# Patient Record
Sex: Female | Born: 1980 | Race: Black or African American | Hispanic: No | Marital: Single | Smoking: Never smoker
Health system: Southern US, Community
[De-identification: ages and names within clinical notes are randomized; demographics above are authoritative.]

## PROBLEM LIST (undated history)

## (undated) DIAGNOSIS — O1490 Unspecified pre-eclampsia, unspecified trimester: Secondary | ICD-10-CM

## (undated) DIAGNOSIS — Z973 Presence of spectacles and contact lenses: Secondary | ICD-10-CM

## (undated) DIAGNOSIS — I10 Essential (primary) hypertension: Secondary | ICD-10-CM

## (undated) DIAGNOSIS — D649 Anemia, unspecified: Secondary | ICD-10-CM

## (undated) DIAGNOSIS — K589 Irritable bowel syndrome without diarrhea: Secondary | ICD-10-CM

## (undated) DIAGNOSIS — A6000 Herpesviral infection of urogenital system, unspecified: Secondary | ICD-10-CM

## (undated) DIAGNOSIS — N920 Excessive and frequent menstruation with regular cycle: Secondary | ICD-10-CM

## (undated) DIAGNOSIS — IMO0002 Reserved for concepts with insufficient information to code with codable children: Secondary | ICD-10-CM

## (undated) DIAGNOSIS — D219 Benign neoplasm of connective and other soft tissue, unspecified: Secondary | ICD-10-CM

## (undated) HISTORY — DX: Reserved for concepts with insufficient information to code with codable children: IMO0002

## (undated) HISTORY — DX: Essential (primary) hypertension: I10

## (undated) HISTORY — PX: OTHER SURGICAL HISTORY: SHX169

## (undated) HISTORY — DX: Unspecified pre-eclampsia, unspecified trimester: O14.90

## (undated) HISTORY — PX: DILATION AND CURETTAGE OF UTERUS: SHX78

## (undated) HISTORY — DX: Benign neoplasm of connective and other soft tissue, unspecified: D21.9

## (undated) HISTORY — DX: Herpesviral infection of urogenital system, unspecified: A60.00

## (undated) HISTORY — PX: MYOMECTOMY ABDOMINAL APPROACH: SUR870

## (undated) HISTORY — DX: Irritable bowel syndrome, unspecified: K58.9

---

## 2001-12-12 ENCOUNTER — Other Ambulatory Visit: Admission: RE | Admit: 2001-12-12 | Discharge: 2001-12-12 | Payer: Self-pay | Admitting: Obstetrics and Gynecology

## 2002-04-24 DIAGNOSIS — R87619 Unspecified abnormal cytological findings in specimens from cervix uteri: Secondary | ICD-10-CM

## 2002-04-24 DIAGNOSIS — IMO0002 Reserved for concepts with insufficient information to code with codable children: Secondary | ICD-10-CM

## 2002-04-24 HISTORY — DX: Unspecified abnormal cytological findings in specimens from cervix uteri: R87.619

## 2002-04-24 HISTORY — PX: COLPOSCOPY: SHX161

## 2002-04-24 HISTORY — DX: Reserved for concepts with insufficient information to code with codable children: IMO0002

## 2002-12-26 ENCOUNTER — Other Ambulatory Visit: Admission: RE | Admit: 2002-12-26 | Discharge: 2002-12-26 | Payer: Self-pay | Admitting: Obstetrics and Gynecology

## 2004-01-26 ENCOUNTER — Other Ambulatory Visit: Admission: RE | Admit: 2004-01-26 | Discharge: 2004-01-26 | Payer: Self-pay | Admitting: Obstetrics and Gynecology

## 2004-07-05 ENCOUNTER — Emergency Department (HOSPITAL_COMMUNITY): Admission: EM | Admit: 2004-07-05 | Discharge: 2004-07-05 | Payer: Self-pay | Admitting: Emergency Medicine

## 2005-01-30 ENCOUNTER — Other Ambulatory Visit: Admission: RE | Admit: 2005-01-30 | Discharge: 2005-01-30 | Payer: Self-pay | Admitting: Obstetrics and Gynecology

## 2006-04-24 DIAGNOSIS — A6000 Herpesviral infection of urogenital system, unspecified: Secondary | ICD-10-CM

## 2006-04-24 HISTORY — DX: Herpesviral infection of urogenital system, unspecified: A60.00

## 2006-09-20 ENCOUNTER — Encounter (INDEPENDENT_AMBULATORY_CARE_PROVIDER_SITE_OTHER): Payer: Self-pay | Admitting: Obstetrics and Gynecology

## 2006-09-20 ENCOUNTER — Ambulatory Visit (HOSPITAL_COMMUNITY): Admission: RE | Admit: 2006-09-20 | Discharge: 2006-09-20 | Payer: Self-pay | Admitting: Obstetrics and Gynecology

## 2007-03-25 LAB — CONVERTED CEMR LAB

## 2007-07-30 ENCOUNTER — Ambulatory Visit: Payer: Self-pay | Admitting: Family Medicine

## 2007-07-30 DIAGNOSIS — I10 Essential (primary) hypertension: Secondary | ICD-10-CM | POA: Insufficient documentation

## 2007-08-19 ENCOUNTER — Telehealth: Payer: Self-pay | Admitting: Family Medicine

## 2008-03-02 ENCOUNTER — Encounter: Admission: RE | Admit: 2008-03-02 | Discharge: 2008-03-02 | Payer: Self-pay | Admitting: Gastroenterology

## 2008-05-07 ENCOUNTER — Encounter: Admission: RE | Admit: 2008-05-07 | Discharge: 2008-05-07 | Payer: Self-pay | Admitting: Obstetrics and Gynecology

## 2008-09-20 ENCOUNTER — Emergency Department (HOSPITAL_BASED_OUTPATIENT_CLINIC_OR_DEPARTMENT_OTHER): Admission: EM | Admit: 2008-09-20 | Discharge: 2008-09-20 | Payer: Self-pay | Admitting: Emergency Medicine

## 2008-10-17 ENCOUNTER — Encounter (INDEPENDENT_AMBULATORY_CARE_PROVIDER_SITE_OTHER): Payer: Self-pay | Admitting: Obstetrics and Gynecology

## 2008-10-17 ENCOUNTER — Ambulatory Visit (HOSPITAL_COMMUNITY): Admission: AD | Admit: 2008-10-17 | Discharge: 2008-10-18 | Payer: Self-pay | Admitting: Obstetrics and Gynecology

## 2009-04-30 ENCOUNTER — Ambulatory Visit: Payer: Self-pay | Admitting: Family Medicine

## 2009-05-13 ENCOUNTER — Ambulatory Visit: Payer: Self-pay | Admitting: Family Medicine

## 2009-05-17 ENCOUNTER — Ambulatory Visit: Payer: Self-pay | Admitting: Family Medicine

## 2009-05-19 ENCOUNTER — Encounter: Payer: Self-pay | Admitting: Family Medicine

## 2009-05-20 ENCOUNTER — Telehealth: Payer: Self-pay | Admitting: Family Medicine

## 2009-05-20 LAB — CONVERTED CEMR LAB
AST: 13 units/L (ref 0–37)
BUN: 8 mg/dL (ref 6–23)
Cholesterol: 123 mg/dL (ref 0–200)
Creatinine, Ser: 0.7 mg/dL (ref 0.40–1.20)
HDL: 40 mg/dL (ref >39)
Potassium: 4.1 meq/L (ref 3.5–5.3)
Sodium: 138 meq/L (ref 135–145)
TSH: 0.962 microintl units/mL (ref 0.350–4.500)
Total Bilirubin: 0.3 mg/dL (ref 0.3–1.2)
Total CHOL/HDL Ratio: 3.1
Total Protein: 7.9 g/dL (ref 6.0–8.3)
Triglycerides: 61 mg/dL (ref <150)
VLDL: 12 mg/dL (ref 0–40)

## 2009-07-30 ENCOUNTER — Ambulatory Visit: Payer: Self-pay | Admitting: Family Medicine

## 2009-08-25 ENCOUNTER — Ambulatory Visit: Payer: Self-pay | Admitting: Family Medicine

## 2009-08-25 LAB — CONVERTED CEMR LAB
Glucose, Urine, Semiquant: NEGATIVE
pH: 7

## 2009-08-26 ENCOUNTER — Encounter: Payer: Self-pay | Admitting: Family Medicine

## 2010-02-25 DIAGNOSIS — Z98891 History of uterine scar from previous surgery: Secondary | ICD-10-CM | POA: Insufficient documentation

## 2010-05-03 ENCOUNTER — Encounter: Payer: Self-pay | Admitting: Family Medicine

## 2010-05-24 NOTE — Assessment & Plan Note (Signed)
Summary: Pregnancy   Vital Signs:  Patient profile:   30 year old female Height:      67 inches Weight:      181 pounds Pulse rate:   106 / minute BP sitting:   127 / 76  (left arm) Cuff size:   regular  Vitals Entered By: Kathlene November (July 30, 2009 3:32 PM) CC: pregnancy test   Primary Care Provider:  Linford Arnold, C  CC:  pregnancy test.  History of Present Illness: Frist day of last period was 06/17/2009.  Home pregnancy  is +. Made apt on the April 25th.  Due date 03-25-2010.  Taking a prenatal vitamin.  Today is 6 weeks and 2 days.    Current Medications (verified): 1)  Prenatal/folic Acid  Tabs (Prenatal Vit-Fe Fumarate-Fa) .Marland Kitchen.. 1 Tab By Mouth Once Daily  Allergies (verified): 1)  ! * Guafenasin  Comments:  Nurse/Medical Assistant: The patient's medications and allergies were reviewed with the patient and were updated in the Medication and Allergy Lists. Kathlene November (July 30, 2009 3:33 PM)  Social History: Reviewed history from 05/13/2009 and no changes required. 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   Never Smoked Alcohol use-yes, rarely   Drug use-no Regular exercise-yes, twice a week.   No caffeine daily.    Impression & Recommendations:  Problem # 1:  PREGNANCY (ICD-V22.2) Dsicussed will get a serum to confirm for her OB office. She already has an appt and is taking a PNV. Discussed care and exercise. Call her ob if any spotting. She is 6 weeks and 2 days today.   Orders: Urine Pregnancy Test  (04540) T-Pregnancy (Serum), Quant. [phone])  Complete Medication List: 1)  Prenatal/folic Acid Tabs (Prenatal vit-fe fumarate-fa) .Marland Kitchen.. 1 tab by mouth once daily  Laboratory Results   Urine Tests  Date/Time Received: 07/30/2009 Date/Time Reported: 07/30/2009    Urine HCG: positive

## 2010-05-24 NOTE — Assessment & Plan Note (Signed)
Summary: possible uti   Vital Signs:  Patient Profile:   30 Years Old Female CC:      Polyuria, tingling not painful urination x 3 days, pt is [redacted]wks pregnant Height:     67 inches Weight:      182 pounds O2 Sat:      100 % O2 treatment:    Room Air Temp:     98.2 degrees F oral Pulse rate:   109 / minute Pulse rhythm:   regular Resp:     12 per minute BP sitting:   135 / 82  (right arm) Cuff size:   regular  Vitals Entered By: Emilio Math (Aug 25, 2009 8:14 AM)                  Prior Medication List:  PRENATAL/FOLIC ACID  TABS (PRENATAL VIT-FE FUMARATE-FA) 1 tab by mouth once daily   Current Allergies (reviewed today): ! * GUAFENASINHistory of Present Illness Chief Complaint: Polyuria, tingling not painful urination x 3 days, pt is [redacted]wks pregnant History of Present Illness:  Patient has been having frequency and atingling sensation for the last 3 days. Not so much pain as tingling. patient is also about [redacted] weeks pregnant.  Current Problems: URINARY TRACT INFECTION (ICD-599.0) PREGNANCY (ICD-V22.2) HEALTH MAINTENANCE EXAM (ICD-V70.0) ELEVATED BP READING WITHOUT DX HYPERTENSION (ICD-796.2)   Current Meds PRENATAL/FOLIC ACID  TABS (PRENATAL VIT-FE FUMARATE-FA) 1 tab by mouth once daily AMOXICILLIN 875 MG TABS (AMOXICILLIN)   REVIEW OF SYSTEMS Constitutional Symptoms      Denies fever, chills, night sweats, weight loss, weight gain, and fatigue.  Eyes       Denies change in vision, eye pain, eye discharge, glasses, contact lenses, and eye surgery. Ear/Nose/Throat/Mouth       Denies hearing loss/aids, change in hearing, ear pain, ear discharge, dizziness, frequent runny nose, frequent nose bleeds, sinus problems, sore throat, hoarseness, and tooth pain or bleeding.  Respiratory       Denies dry cough, productive cough, wheezing, shortness of breath, asthma, bronchitis, and emphysema/COPD.  Cardiovascular       Denies murmurs, chest pain, and tires easily with  exhertion.    Gastrointestinal       Denies stomach pain, nausea/vomiting, diarrhea, constipation, blood in bowel movements, and indigestion. Genitourniary       Complains of painful urination.      Denies kidney stones and loss of urinary control. Neurological       Denies paralysis, seizures, and fainting/blackouts. Musculoskeletal       Denies muscle pain, joint pain, joint stiffness, decreased range of motion, redness, swelling, muscle weakness, and gout.  Skin       Denies bruising, unusual mles/lumps or sores, and hair/skin or nail changes.  Psych       Denies mood changes, temper/anger issues, anxiety/stress, speech problems, depression, and sleep problems.  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   Never Smoked Alcohol use-yes, rarely   Drug use-no Regular exercise-yes, twice a week.   No caffeine daily.   Risk Factors: Alcohol Use: 0 (07/30/2007) Caffeine Use: 2 (07/30/2007) Exercise: no (07/30/2007)  Risk Factors: Smoking Status: never (07/30/2007)  Past Medical History: OB: Dr. Recardo Evangelist in [facility]  Past Surgical History: Reviewed history from 05/13/2009 and no changes required. Left  Breast Biopsy- normal on 06/1995 D & C 08/2006, 09/2008 Abdominal myomectomy in 01/2009  Family History: Reviewed history from 07/30/2007 and no changes required. Parent with alcoholism Aunt with BrCa Parent, aunt with HTN  Social History: Reviewed history from 05/13/2009 and no changes required. 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   Never Smoked Alcohol use-yes, rarely   Drug use-no Regular exercise-yes, twice a week.   No caffeine daily.  Physical Exam General appearance:  well developed, well nourished, no acute distress Head: normocephalic, atraumatic Abdomen: soft, non-tender without obvious organomegaly Back: no CVA tenderness Skin: no obvious rashes or lesions MSE: oriented to time, place, and person Assessment Problems:   PREGNANCY (ICD-V22.2) HEALTH MAINTENANCE EXAM (ICD-V70.0) ELEVATED BP READING WITHOUT DX HYPERTENSION (ICD-796.2) New Problems: URINARY TRACT INFECTION (ICD-599.0)  UTI  Patient Education: Patient and/or caregiver instructed in the following: rest fluids and Tylenol.  Plan New Medications/Changes: AMOXICILLIN 875 MG TABS (AMOXICILLIN)  #20 x 0, 08/25/2009, Hassan Rowan MD  New Orders: UA Dipstick w/o Micro (manual) [81002] T-Culture, Urine [id number] Est. Patient Level III [81191] Planning Comments:   as below  Follow Up: Follow up on an as needed basis, Follow up with Primary Physician  The patient and/or caregiver has been counseled thoroughly with regard to medications prescribed including dosage, schedule, interactions, rationale for use, and possible side effects and they verbalize understanding.  Diagnoses and expected course of recovery discussed and will return if not improved as expected or if the condition worsens. Patient and/or caregiver verbalized understanding.  Prescriptions: AMOXICILLIN 875 MG TABS (AMOXICILLIN)   #20 x 0   Entered and Authorized by:   Hassan Rowan MD   Signed by:   Hassan Rowan MD on 08/25/2009   Method used:   Printed then faxed to ...       [facility]* (retail)       [address] [place], [place]  [address]       Ph: [phone]       Fax: [phone]   RxID:   [id number]   Patient Instructions: 1)  Please schedule an appointment with your primary doctor in : 2)  Please schedule a follow-up appointment in 2 weeks. 3)  Take your antibiotic as prescribed until ALL of it is gone, but stop if you develop a rash or swelling and contact our office as soon as  possible. 4)  Please schedule a follow-up appointment as needed.  Laboratory Results   Urine Tests  Date/Time Received: Aug 25, 2009 8:56 AM  Date/Time Reported: Aug 25, 2009 8:56 AM   Routine Urinalysis   Color: yellow Appearance: Clear Glucose: negative   (Normal Range: Negative) Bilirubin: negative   (Normal Range: Negative) Ketone: negative   (Normal Range: Negative) Spec. Gravity: 1.020   (Normal Range: 1.003-1.035) Blood: trace-intact   (Normal Range: Negative) pH: 7.0   (Normal Range: 5.0-8.0) Protein: 30   (Normal Range: Negative) Urobilinogen: 0.2   (Normal Range: 0-1) Nitrite: negative   (Normal Range: Negative) Leukocyte Esterace: small   (Normal Range: Negative)      Emilio Math  Aug 25, 2009 8:59 AM

## 2010-05-24 NOTE — Progress Notes (Signed)
Summary: Not feeling any better  Phone Note Call from Patient Call back at Home Phone [phone]   Caller: Patient Call For: Nani Gasser MD Summary of Call: Pt calls and states that she is not feeling any better and ear starting to hurt as well. Told to call if no better. Uses [facility] in [place] Initial call taken by: Kathlene November,  May 20, 2009 8:30 AM  Follow-up for Phone Call        abx sent.  call back if not better after complete ABX.  Follow-up by: Nani Gasser MD,  May 20, 2009 8:34 AM    New/Updated Medications: AMOXICILLIN 500 MG CAPS (AMOXICILLIN) Take 1 tablet by mouth three times a day for 10 days Prescriptions: AMOXICILLIN 500 MG CAPS (AMOXICILLIN) Take 1 tablet by mouth three times a day for 10 days  #30 x 0   Entered and Authorized by:   Nani Gasser MD   Signed by:   Nani Gasser MD on 05/20/2009   Method used:   Electronically to        [facility]* (retail)       [address] [place], [place]  [address]       Ph: [phone]       Fax: [phone]   RxID:   [id number]

## 2010-05-24 NOTE — Assessment & Plan Note (Signed)
Summary: CPE   Vital Signs:  Patient profile:   30 year old female Height:      67 inches Weight:      175 pounds Pulse rate:   99 / minute BP sitting:   137 / 81  (left arm) Cuff size:   regular  Vitals Entered By: Kathlene November (May 13, 2009 4:19 PM) CC: CPE no pap   Primary Care Provider:  Linford Arnold, C  CC:  CPE no pap.  History of Present Illness: Sees gyn - Has her fu pap in one month. Last one was a year ago.  She is try ing to get prepnant. Had myomectomy in the fall to help her get pregnant. Shis is taking a PNV and off her OCPs.  No other specific complaints today.    Current Medications (verified): 1)  Prenatal/folic Acid  Tabs (Prenatal Vit-Fe Fumarate-Fa) .Marland Kitchen.. 1 Tab By Mouth Once Daily  Allergies (verified): 1)  ! * Guafenasin  Comments:  Nurse/Medical Assistant: The patient's medications and allergies were reviewed with the patient and were updated in the Medication and Allergy Lists. Kathlene November (May 13, 2009 4:19 PM)  Past History:  Past Medical History: OB: Dr. Recardo Evangelist in [facility]  Past Surgical History: Left Breast Biopsy- normal on 06/1995 D & C 08/2006, 09/2008 Abdominal myomectomy in 01/2009  Family History: Reviewed history from 07/30/2007 and no changes required. Parent with alcoholism Aunt with BrCa Parent, aunt with HTN  Social History: Warehouse manager.  Post baccalaureate degree.  Married to [employer].  No children.   Never Smoked Alcohol use-yes, rarely   Drug use-no Regular exercise-yes, twice a week.   No caffeine daily.   Review of Systems  The patient denies anorexia, fever, weight loss, weight gain, vision loss, decreased hearing, hoarseness, chest pain, syncope, dyspnea on exertion, peripheral edema, prolonged cough, headaches, hemoptysis, abdominal pain, melena, hematochezia, severe indigestion/heartburn, hematuria, incontinence, genital sores, muscle weakness, suspicious skin lesions, transient  blindness, difficulty walking, depression, unusual weight change, abnormal bleeding, enlarged lymph nodes, angioedema, and breast masses.    Physical Exam  General:  Well-developed,well-nourished,in no acute distress; alert,appropriate and cooperative throughout examination Head:  Normocephalic and atraumatic without obvious abnormalities. No apparent alopecia or balding. Eyes:  No corneal or conjunctival inflammation noted. EOMI. Perrla.  Ears:  External ear exam shows no significant lesions or deformities.  Otoscopic examination reveals clear canals, tympanic membranes are intact bilaterally without bulging, retraction, inflammation or discharge. Hearing is grossly normal bilaterally. Nose:  External nasal examination shows no deformity or inflammation. Mouth:  Oral mucosa and oropharynx without lesions or exudates.  Teeth in good repair. Neck:  No deformities, masses, or tenderness noted. No TM.  Chest Wall:  No deformities, masses, or tenderness noted. Lungs:  Normal respiratory effort, chest expands symmetrically. Lungs are clear to auscultation, no crackles or wheezes. Heart:  Normal rate and regular rhythm. S1 and S2 normal without gallop, murmur, click, rub or other extra sounds. Abdomen:  Bowel sounds positive,abdomen soft and non-tender without masses, organomegaly or hernias noted. Msk:  No deformity or scoliosis noted of thoracic or lumbar spine.   Pulses:  R and L carotid,radial,dorsalis pedis and posterior tibial pulses are full and equal bilaterally Extremities:  No clubbing, cyanosis, edema, or deformity noted with normal full range of motion of all joints.   Neurologic:  No cranial nerve deficits noted. Station and gait are normal. DTRs are symmetrical throughout. Sensory, motor and coordinative functions appear intact. Skin:  no  rashes.   Cervical Nodes:  No lymphadenopathy noted Psych:  Cognition and judgment appear intact. Alert and cooperative with normal attention span and  concentration. No apparent delusions, illusions, hallucinations   Impression & Recommendations:  Problem # 1:  HEALTH MAINTENANCE EXAM (ICD-V70.0) Exam looks great today. BP is much better but still borderline. Will keep an eye on this.  See pt instructions.  Due for screening labs.  Reviewed important info since she is trying to get pregnant.  Orders: T-Comprehensive Metabolic Panel [id number]) T-Lipid Profile [id number]) T-TSH [id number])  Complete Medication List: 1)  Prenatal/folic Acid Tabs (Prenatal vit-fe fumarate-fa) .Marland Kitchen.. 1 tab by mouth once daily  Patient Instructions: 1)  Keep taking prenatal vitamin. 2)  Make sure BP under 140/90 when go for OB appt next month. If high please come back in. 3)  Try to increase exercise to 3 days a  week. 4)  Eat low fat diet and minimize alcohol and caffeine while trying to get pregnant. 5)  Ask your OB about immune status to rubella.    Flu Vaccine Next Due:  Refused Last PAP:  Unknown (03/25/2007 10:25:36 AM) PAP Result Date:  04/24/2008 PAP Result:  normal PAP Next Due:  2 yr

## 2010-05-24 NOTE — Assessment & Plan Note (Signed)
Summary: headache, cough-drk yellow, sorethroat x 3 dys rm 3   Vital Signs:  Patient Profile:   30 Years Old Female CC:      Cold & URI symptoms Height:     67 inches Weight:      176 pounds O2 Sat:      100 % O2 treatment:    Room Air Temp:     99.7 degrees F oral Pulse rate:   115 / minute Pulse rhythm:   irregular Resp:     16 per minute BP sitting:   152 / 96  (right arm) Cuff size:   regular  Vitals Entered By: Areta Haber CMA (April 30, 2009 4:57 PM)                  Current Allergies: ! * GUAFENASIN      History of Present Illness Chief Complaint: Cold & URI symptoms History of Present Illness: Subjective: Patient complains of URI symptoms for 2 days. + mild sore throat + cough today No pleuritic pain No wheezing + nasal congestion No post-nasal drainage No sinus pain/pressure No itchy/red eyes No earache No hemoptysis No SOB No fever/chills No nausea No vomiting No abdominal pain No diarrhea No skin rashes + mild fatigue No myalgias No headache Used OTC meds without relief   Current Problems: URI (ICD-465.9) ELEVATED BP READING WITHOUT DX HYPERTENSION (ICD-796.2) SINUSITIS- ACUTE-NOS (ICD-461.9)   Current Meds PRENATAL/FOLIC ACID  TABS (PRENATAL VIT-FE FUMARATE-FA) 1 tab by mouth once daily CORICIDIN HBP COLD/FLU 2-325 MG TABS (CHLORPHENIRAMINE-APAP) As directed BENZONATATE 200 MG CAPS (BENZONATATE) One by mouth HS as needed cough AZITHROMYCIN 250 MG TABS (AZITHROMYCIN) Two tabs by mouth on day 1, then 1 tab daily on days 2 through 5  REVIEW OF SYSTEMS Constitutional Symptoms      Denies fever, chills, night sweats, weight loss, weight gain, and fatigue.  Eyes       Denies change in vision, eye pain, eye discharge, glasses, contact lenses, and eye surgery. Ear/Nose/Throat/Mouth       Complains of frequent runny nose, sinus problems, sore throat, and hoarseness.      Denies hearing loss/aids, change in hearing, ear pain, ear  discharge, dizziness, frequent nose bleeds, and tooth pain or bleeding.      Comments: x 3 dys Respiratory       Complains of productive cough.      Denies dry cough, wheezing, shortness of breath, asthma, bronchitis, and emphysema/COPD.  Cardiovascular       Denies murmurs, chest pain, and tires easily with exhertion.    Gastrointestinal       Denies stomach pain, nausea/vomiting, diarrhea, constipation, blood in bowel movements, and indigestion. Genitourniary       Denies painful urination, kidney stones, and loss of urinary control. Neurological       Complains of headaches.      Denies paralysis, seizures, and fainting/blackouts. Musculoskeletal       Denies muscle pain, joint pain, joint stiffness, decreased range of motion, redness, swelling, muscle weakness, and gout.  Skin       Denies bruising, unusual mles/lumps or sores, and hair/skin or nail changes.  Psych       Denies mood changes, temper/anger issues, anxiety/stress, speech problems, depression, and sleep problems. 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  Married to [employer].  No children.   Never Smoked Alcohol use-yes Drug use-no Regular exercise-no  Risk Factors: Alcohol Use: 0 (07/30/2007) Caffeine Use: 2 (07/30/2007) Exercise: no (07/30/2007)  Risk Factors: Smoking Status: never (07/30/2007)   Objective:  Appearance:  Patient appears healthy, stated age, and in no acute distress  Eyes:  Pupils are equal, round, and reactive to light and accomdation.  Extraocular movement is intact.  Conjunctivae are not inflamed.  Ears:  Canals normal.  Tympanic membranes normal.   Nose:  Normal  septum.  Normal turbinates, mildly congested.    No sinus tenderness present.  Pharynx:  Normal  Neck:  Supple.  No adenopathy is present.   Lungs:  Clear to auscultation.  Breath sounds are equal.  Heart:  Regular rate and rhythm without murmurs, rubs, or gallops.  Abdomen:  Nontender without masses or hepatosplenomegaly.  Bowel sounds are present.  No CVA or flank tenderness.  Skin:  No rash Assessment New Problems: URI (ICD-465.9)  VIRAL URI  Plan New Medications/Changes: AZITHROMYCIN 250 MG TABS (AZITHROMYCIN) Two tabs by mouth on day 1, then 1 tab daily on days 2 through 5  #6 tabs x 0, 04/30/2009, Donna Christen MD BENZONATATE 200 MG CAPS (BENZONATATE) One by mouth HS as needed cough  #12 x 0, 04/30/2009, Donna Christen MD  New Orders: New Patient Level III [phone] Planning Comments:   Treat symptomatically for now:  expectorant/decongestant daytime, cough suppressant at night, rest/fluids If not improved one week, or develops persistent fever, add Z-pack Follow-up with PCP if not improving.   The patient and/or caregiver has been counseled thoroughly with regard to medications prescribed including dosage, schedule, interactions, rationale for use, and possible side effects and they verbalize understanding.  Diagnoses and expected course of recovery discussed and will return if not improved as expected or if the condition worsens. Patient and/or caregiver verbalized understanding.  Prescriptions: AZITHROMYCIN 250 MG TABS (AZITHROMYCIN) Two tabs by mouth on day 1, then 1 tab daily on days 2 through 5  #6 tabs x 0   Entered and Authorized by:   Donna Christen MD   Signed by:   Donna Christen MD on 04/30/2009   Method used:   Print then Give to Patient   RxID:   [id number] BENZONATATE 200 MG CAPS (BENZONATATE) One by mouth HS as needed cough  #12 x 0   Entered and Authorized by:   Donna Christen MD   Signed by:   Donna Christen MD on 04/30/2009   Method used:   Print then Give  to Patient   RxID:   [id number]   Patient Instructions: 1)  May use Mucinex D (guaifenesin with decongestant) twice daily for congestion. 2)  Increase fluid intake, rest. 3)  May use Afrin nasal spray (or generic oxymetazoline) twice daily for about 5 days.  Also recommend using saline nasal spray several times daily and/or saline nasal irrigation. 4)  Add azithromycin if not improving one week or persistent fever develops 5)  Followup with family doctor if not improving 10 to 14 days.

## 2010-05-24 NOTE — Assessment & Plan Note (Signed)
Summary: SWOLLEN GLAND   Vital Signs:  Patient profile:   30 year old female Height:      67 inches Weight:      179 pounds BMI:     28.14 Temp:     98.5 degrees F oral Pulse rate:   101 / minute BP sitting:   125 / 88  (left arm) Cuff size:   regular  Vitals Entered By: Kathlene November (May 17, 2009 3:45 PM) CC: lymph node swollen on left side of neck, yesterday teeth hurt and H/A   Primary Care Provider:  Linford Arnold, C  CC:  lymph node swollen on left side of neck and yesterday teeth hurt and H/A.  History of Present Illness: lymph node swollen on left side of neck, yesterday teeth hurt and H/A.  HA started about 3 days ago. pai in teeth is on the left side on teh upper and lower jaw.  Got out a large food  particle from her tonsils this AM and noticed it was really swollen. Looked up swollen LN on webmd and now scared. No fever or other URI sxs. No ear pain.   Current Medications (verified): 1)  Prenatal/folic Acid  Tabs (Prenatal Vit-Fe Fumarate-Fa) .Marland Kitchen.. 1 Tab By Mouth Once Daily  Allergies (verified): 1)  ! * Guafenasin  Comments:  Nurse/Medical Assistant: The patient's medications and allergies were reviewed with the patient and were updated in the Medication and Allergy Lists. Kathlene November (May 17, 2009 3:46 PM)  Physical Exam  General:  Well-developed,well-nourished,in no acute distress; alert,appropriate and cooperative throughout examination Head:  Normocephalic and atraumatic without obvious abnormalities. No apparent alopecia or balding. Eyes:  No corneal or conjunctival inflammation noted. EOMI. Perrla.  Ears:  External ear exam shows no significant lesions or deformities.  Otoscopic examination reveals clear canals, tympanic membranes are intact bilaterally without bulging, retraction, inflammation or discharge. Hearing is grossly normal bilaterally. Nose:  External nasal examination shows no deformity or inflammation.  Mouth:  Oral mucosa and oropharynx  without lesions or exudates.  Teeth in good repair. Neck:  No deformities, masses, or tenderness noted. Skin:  no rashes.   Cervical Nodes:  SMall anterior left cerv LN. Mildly tender.    Impression & Recommendations:  Problem # 1:  CERVICAL LYMPHADENOPATHY (ICD-785.6) Discussed that likely irritated by tonsillar stones. If signs with throat dn jaw not better in 2-3 days then please let me know and will treat with an ABX.  if LN not resolvedin in 3-4 weesk then please call and will re-evaluate.    Complete Medication List: 1)  Prenatal/folic Acid Tabs (Prenatal vit-fe fumarate-fa) .Marland Kitchen.. 1 tab by mouth once daily

## 2010-05-26 NOTE — Letter (Signed)
Summary: [facility]   Imported By: Lanelle Bal 05/19/2010 10:48:17  _____________________________________________________________________  External Attachment:    Type:   Image     Comment:   External Document

## 2010-06-20 ENCOUNTER — Encounter: Payer: Self-pay | Admitting: Family Medicine

## 2010-06-30 ENCOUNTER — Encounter: Payer: Self-pay | Admitting: Family Medicine

## 2010-06-30 ENCOUNTER — Other Ambulatory Visit (HOSPITAL_COMMUNITY)
Admission: RE | Admit: 2010-06-30 | Discharge: 2010-06-30 | Disposition: A | Discharge status: Home / Self Care | Payer: BC Managed Care – PPO | Source: Ambulatory Visit | Attending: Family Medicine | Admitting: Family Medicine

## 2010-06-30 ENCOUNTER — Other Ambulatory Visit: Payer: Self-pay | Admitting: Family Medicine

## 2010-06-30 ENCOUNTER — Encounter (INDEPENDENT_AMBULATORY_CARE_PROVIDER_SITE_OTHER): Payer: BC Managed Care – PPO | Admitting: Family Medicine

## 2010-06-30 DIAGNOSIS — Z01419 Encounter for gynecological examination (general) (routine) without abnormal findings: Secondary | ICD-10-CM

## 2010-07-05 ENCOUNTER — Telehealth: Payer: Self-pay | Admitting: Family Medicine

## 2010-07-05 NOTE — Assessment & Plan Note (Signed)
Summary: CPE   Vital Signs:  Patient profile:   30 year old female Height:      67 inches Weight:      200 pounds Pulse rate:   71 / minute BP sitting:   139 / 86  (right arm) Cuff size:   regular  Vitals Entered By: Avon Gully CMA, Duncan Dull) (June 30, 2010 3:47 PM) CC: CPE,no pap   Primary Care Provider:  Linford Arnold, C  CC:  CPE and no pap.  History of Present Illness: Has gained 18 lbs. Had a baby November 4th, 2011. No longer nursing.  interested in the Femcap. She really wants non-hormonal birthcontrol because of the fibroids.   Has been taking labetolol three times a day for her HTN. Now she is not pregnant or nursing so would like ot change meds.   Current Medications (verified): 1)  Prenatal/folic Acid  Tabs (Prenatal Vit-Fe Fumarate-Fa) .Marland Kitchen.. 1 Tab By Mouth Once Daily 2)  Labetalol Hcl 5 Mg/ml Soln (Labetalol Hcl)  Allergies (verified): 1)  ! * Guafenasin  Comments:  Nurse/Medical Assistant: The patient's medications and allergies were reviewed with the patient and were updated in the Medication and Allergy Lists. Avon Gully CMA, Duncan Dull) (June 30, 2010 3:49 PM)  Family History: Parent with alcoholism 2 Maternal Aunt with BrCa Pat Aunt with BrCA Parent, aunt with HTN  Social History: Warehouse manager.  Post baccalaureate degree.  Married to [employer].  1 girl.  Never Smoked Alcohol use-yes, rarely   Drug use-no Regular exercise-yes, twice a week.   No caffeine daily.   Physical Exam  General:  Well-developed,well-nourished,in no acute distress; alert,appropriate and cooperative throughout examination Head:  Normocephalic and atraumatic without obvious abnormalities. No apparent alopecia or balding. Eyes:  No corneal or conjunctival inflammation noted. EOMI. Perrla. Ears:  External ear exam shows no significant lesions or deformities.  Otoscopic examination reveals clear canals, tympanic membranes are intact bilaterally without  bulging, retraction, inflammation or discharge. Hearing is grossly normal bilaterally. Nose:  External nasal examination shows no deformity or inflammation.  Mouth:  Oral mucosa and oropharynx without lesions or exudates.  Teeth in good repair. Neck:  No deformities, masses, or tenderness noted. Lungs:  Normal respiratory effort, chest expands symmetrically. Lungs are clear to auscultation, no crackles or wheezes. Heart:  Normal rate and regular rhythm. S1 and S2 normal without gallop, murmur, click, rub or other extra sounds.   Impression & Recommendations:  Problem # 1:  ROUTINE GYNECOLOGICAL EXAMINATION (ICD-V72.31) Exam is normal today will call with the pap results. Continue the regular exercise!!! Keep up the good work.    Problem # 2:  HYPERTENSION, BENIGN (ICD-401.1) Will change to metoprolol and f/u in one month to recheck BP.  Her updated medication list for this problem includes:    Metoprolol Succinate 50 Mg Xr24h-tab (Metoprolol succinate) .Marland Kitchen... Take 1 tablet by mouth once a day  Complete Medication List: 1)  Prenatal/folic Acid Tabs (Prenatal vit-fe fumarate-fa) .Marland Kitchen.. 1 tab by mouth once daily 2)  Metoprolol Succinate 50 Mg Xr24h-tab (Metoprolol succinate) .... Take 1 tablet by mouth once a day  Patient Instructions: 1)  We will call with the pap results. 2)  Your choleserol looked great last year.  3)  Encourage you to get your flu shot next year since you have a baby at home now.   Prescriptions: METOPROLOL SUCCINATE 50 MG XR24H-TAB (METOPROLOL SUCCINATE) Take 1 tablet by mouth once a day  #30 x 1  Entered and Authorized by:   Nani Gasser MD   Signed by:   Nani Gasser MD on 06/30/2010   Method used:   Electronically to        [facility]* (retail)       [address] [place], [place]  [address]       Ph: [phone]       Fax: [phone]   RxID:   [id number]    Orders Added: 1)  Est. Patient age 75-39  [73]          Appended Document: CPE     Physical Exam  Chest Wall:  No deformities, masses, or tenderness noted. Breasts:  No mass, nodules, thickening, tenderness, bulging, retraction, inflamation, nipple discharge or skin changes noted.   Abdomen:  Bowel sounds positive,abdomen soft and non-tender without masses, organomegaly or hernias noted. Genitalia:  Normal introitus for age, no external lesions, no vaginal discharge, mucosa pink and moist, no vaginal or cervical lesions, no vaginal atrophy, no friaility or hemorrhage, normal uterus size and position, no adnexal masses or tenderness Msk:  No deformity or scoliosis noted of thoracic or lumbar spine.   Pulses:  R and L carotid,radial,dorsalis pedis and posterior tibial pulses are full and equal bilaterally Extremities:  No clubbing, cyanosis, edema, or deformity noted with normal full range of motion of all joints.   Neurologic:  No cranial nerve deficits noted. Station and gait are normal. Sensory, motor and coordinative functions appear intact. Skin:  no rashes.   Cervical Nodes:  No lymphadenopathy noted Psych:  Cognition and judgment appear intact. Alert and cooperative with normal attention span and concentration. No apparent delusions, illusions, hallucinations

## 2010-07-12 NOTE — Progress Notes (Signed)
Summary: Femcap ?  Phone Note Outgoing Call   Summary of Call: Called and let pt know they don't do femcap down the hall.  in GYN  Initial call taken by: Nani Gasser MD,  July 05, 2010 12:37 PM

## 2010-08-01 LAB — CBC
HCT: 18.4 % — ABNORMAL LOW (ref 36.0–46.0)
HCT: 22.3 % — ABNORMAL LOW (ref 36.0–46.0)
Hemoglobin: 6.4 g/dL — CL (ref 12.0–15.0)
Hemoglobin: 7.7 g/dL — CL (ref 12.0–15.0)
MCHC: 34.5 g/dL (ref 30.0–36.0)
Platelets: 351 10*3/uL (ref 150–400)
RBC: 2.48 MIL/uL — ABNORMAL LOW (ref 3.87–5.11)
RDW: 13.8 % (ref 11.5–15.5)
WBC: 7.9 10*3/uL (ref 4.0–10.5)

## 2010-08-01 LAB — HCG, QUANTITATIVE, PREGNANCY: hCG, Beta Chain, Quant, S: 27 m[IU]/mL — ABNORMAL HIGH (ref <5)

## 2010-08-02 LAB — URINE MICROSCOPIC-ADD ON

## 2010-08-02 LAB — URINALYSIS, ROUTINE W REFLEX MICROSCOPIC
Ketones, ur: NEGATIVE mg/dL
Nitrite: NEGATIVE
pH: 6 (ref 5.0–8.0)

## 2010-08-02 LAB — URINE CULTURE

## 2010-08-02 LAB — PREGNANCY, URINE: Preg Test, Ur: POSITIVE

## 2010-09-06 NOTE — Op Note (Signed)
[name], [name]              ACCOUNT NO.:  [id number]   MEDICAL RECORD NO.:  [id number]          PATIENT TYPE:  AMB   LOCATION:  SDC                           FACILITY:  [facility]   PHYSICIAN:  Carrington Clamp, M.D. DATE OF BIRTH:  1980/10/25   DATE OF PROCEDURE:  09/20/2006  DATE OF DISCHARGE:                               OPERATIVE REPORT   PREOPERATIVE DIAGNOSIS:  Missed abortion.   POSTOPERATIVE DIAGNOSIS:  Missed abortion.   OPERATION PERFORMED:  Dilation and evacuation.   SURGEON:  Carrington Clamp, M.D.   ASSISTANT:  None.   ANESTHESIA:  General LMA.   SPECIMENS:  Uterine contents.   ESTIMATED BLOOD LOSS:  150 mL.   IV FLUIDS:  700 mL.   URINE OUTPUT:  Not measured.   COMPLICATIONS:  None.   FINDINGS:  An 8-week size uterus down to 7-week size with good crie.   POSTOP MEDICATIONS:  Methergine.   COUNTS:  Correct x3.   DESCRIPTION OF PROCEDURE:  After adequate general anesthesia was  achieved, the patient was prepped and draped in the usual sterile  fashion dorsal lithotomy position.  The bladder was emptied with a red  rubber catheter and then speculum placed in the vagina.  The cervix was  secured with the single toothed tenaculum and the cervix dilated up with  Shawnie Pons dilators.  An 8 mm curette was passed into uterus and suction  curettage performed.  There was slightly excessive bleeding for the  procedure and Methergine was given with result of reducing the amount of  bleeding.  Sharp and suction curette was alternated until a good crie  was obtained.  Bleeding was then minimal and all instruments were  withdrawn from the vagina.  The patient tolerated the procedure well and  was returned to recovery room in stable condition.      Carrington Clamp, M.D.  Electronically Signed     MH/MEDQ  D:  09/20/2006  T:  09/20/2006  Job:  [id number]

## 2010-09-06 NOTE — Op Note (Signed)
NAMEANTHONY, [name]              ACCOUNT NO.:  [id number]   MEDICAL RECORD NO.:  [id number]          PATIENT TYPE:  INP   LOCATION:  9306                          FACILITY:  [facility]   PHYSICIAN:  Malva Limes, M.D.    DATE OF BIRTH:  04-12-1981   DATE OF PROCEDURE:  10/17/2008  DATE OF DISCHARGE:  10/18/2008                               OPERATIVE REPORT   PREOPERATIVE DIAGNOSES:  1. Menorrhagia.  2. Incomplete abortion.  3. Severe anemia.  4. Uterine fibroids.   POSTOPERATIVE DIAGNOSES:  1. Menorrhagia.  2. Incomplete abortion.  3. Severe anemia.  4. Uterine fibroids.   PROCEDURE:  Dilation and curettage.   SURGEON:  Malva Limes, MD   ANESTHESIA:  MAC with paracervical block.   DRAINS:  Red rubber catheter, bladder.   ESTIMATED BLOOD LOSS:  20 mL.   SPECIMENS:  Products of conception sent to Pathology.   COMPLICATIONS:  None.   PROCEDURE:  The patient was taken to the operating room.  She was placed  in dorsal supine position.  MAC anesthesia was then administered.  She  was then placed in dorsal lithotomy position.  She was prepped and  draped in usual fashion for this procedure.  Her bladder was drained  with a red rubber catheter.  Examination under anesthesia revealed a 12  to 13 weeks' size uterus with multiple uterine fibroids.  Uterus was  retroverted and a sterile speculum was placed in the vaginal cavity.  A  10 mL of 1% lidocaine was used for paracervical block.  The cervix was  grasped with a single-tooth tenaculum and serially dilated to a 69-  [place].  A suction cached cannula was placed into the uterine cavity and  tissue removed.  Sharp curettage was then performed by repeat suction.  Multiple uterine fibroids were palpated during the D and C.  Most  notably the large one of the posterior aspect of the fundus.  Following  this procedure, minimal bleeding was noted.  The patient tolerated the  procedure well.  She was taken to  recovery room in  stable condition.  The patient will be observed  overnight and she is stable, she will be discharged to home.  An attempt  will be made not to transfuse this patient, however, she was  symptomatic, she will have 2 units of packed red blood cells.           ______________________________  Malva Limes, M.D.     MA/MEDQ  D:  10/18/2008  T:  10/18/2008  Job:  [id number]

## 2011-01-08 ENCOUNTER — Encounter: Payer: Self-pay | Admitting: Family Medicine

## 2011-01-10 ENCOUNTER — Ambulatory Visit (INDEPENDENT_AMBULATORY_CARE_PROVIDER_SITE_OTHER): Payer: BC Managed Care – PPO | Admitting: Family Medicine

## 2011-01-10 ENCOUNTER — Encounter: Payer: Self-pay | Admitting: Family Medicine

## 2011-01-10 VITALS — BP 141/94 | HR 59 | Wt 200.0 lb

## 2011-01-10 DIAGNOSIS — I10 Essential (primary) hypertension: Secondary | ICD-10-CM

## 2011-01-10 NOTE — Progress Notes (Signed)
  Subjective:    Patient ID: [name], female    DOB: 07/18/1980, 30 y.o.   MRN: [id number]  Hypertension This is a new problem. The current episode started more than 1 year ago. The problem is unchanged. The problem is uncontrolled. Pertinent negatives include no chest pain or shortness of breath. There are no associated agents to hypertension. Risk factors for coronary artery disease include no known risk factors. Past treatments include beta blockers. The current treatment provides mild improvement. There are no compliance problems.    When BP checks BP at work usually looks good. Missed meds for the lat 3-4 days until this AM. She did take it today. She is otherwise tolerating the metoprolol well. She denies any side effects such as lightheadedness or dizziness.  She also needs a form completed for by the nurses.   Review of Systems  Respiratory: Negative for shortness of breath.   Cardiovascular: Negative for chest pain.       Objective:   Physical Exam  Constitutional: She is oriented to person, place, and time. She appears well-developed and well-nourished.  HENT:  Head: Normocephalic and atraumatic.  Cardiovascular: Normal rate, regular rhythm and normal heart sounds.   Pulmonary/Chest: Effort normal and breath sounds normal.  Musculoskeletal: She exhibits no edema.       Lumbar spine with normal flexion, extension, rotation right and left and side bending. Neck, upper extremities, low extremities with normal range of motion. Strength in the upper extremities and lower extremities 5 over 5 bilaterally. Patellar, Achilles, brachial, radial reflexes 1+ bilaterally  Neurological: She is alert and oriented to person, place, and time.  Skin: Skin is warm and dry.  Psychiatric: She has a normal mood and affect. Her behavior is normal. Judgment and thought content normal.          Assessment & Plan:  Hypertension-is elevated today. I asked that she recheck it at work and fax  over the blood pressure numbers to me. If she is still not well controlled and we can increase her Toprol 100 mg.  Form completed for [facility] nurses. Normal musculoskeletal exam. She says she will get her flu shot through work.

## 2011-01-31 ENCOUNTER — Encounter: Payer: Self-pay | Admitting: Obstetrics & Gynecology

## 2011-01-31 ENCOUNTER — Other Ambulatory Visit: Payer: Self-pay | Admitting: Obstetrics & Gynecology

## 2011-01-31 ENCOUNTER — Ambulatory Visit (INDEPENDENT_AMBULATORY_CARE_PROVIDER_SITE_OTHER): Payer: BC Managed Care – PPO | Admitting: Obstetrics & Gynecology

## 2011-01-31 VITALS — BP 129/85 | HR 93 | Temp 97.7°F | Resp 16 | Ht 68.0 in | Wt 201.0 lb

## 2011-01-31 DIAGNOSIS — D219 Benign neoplasm of connective and other soft tissue, unspecified: Secondary | ICD-10-CM

## 2011-01-31 DIAGNOSIS — D259 Leiomyoma of uterus, unspecified: Secondary | ICD-10-CM

## 2011-01-31 NOTE — Progress Notes (Signed)
  Subjective:    Patient ID: [name], female    DOB: 01-31-1981, 31 y.o.   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  She is here for preconceptual counseling.  She has had 2 miscarriages for which she blames her fibroids.  She had a myomectomy and HSG in [place] in 2010 and then conceived in 2011.  She wants to complete her childbearing ASAP because she is afraid that her fibroids will return.  She is also concerned because she was preeclamptic on bedrest for "weeks" in the hospital before she delivered last year.  She has been treated for HTN since then.    Review of Systems    She is a married speech therapist.  Her pap was normal in 3/12 Objective:   Physical Exam        Assessment & Plan:  Preconceptual counseling- I will get an u/s to determine how many fibroids she currently has. We have discussed [employer] and weight loss before pregnancy to help with her HTN and risk of pre eclampsia.

## 2011-02-02 ENCOUNTER — Ambulatory Visit
Admission: RE | Admit: 2011-02-02 | Discharge: 2011-02-02 | Disposition: A | Discharge status: Home / Self Care | Payer: BC Managed Care – PPO | Source: Ambulatory Visit | Attending: Obstetrics & Gynecology | Admitting: Obstetrics & Gynecology

## 2011-02-02 DIAGNOSIS — D219 Benign neoplasm of connective and other soft tissue, unspecified: Secondary | ICD-10-CM

## 2011-02-07 LAB — LIPID PANEL
Cholesterol: 138 mg/dL (ref 0–200)
HDL: 48 mg/dL (ref >39)
LDL Cholesterol: 78 mg/dL (ref 0–99)
Triglycerides: 60 mg/dL (ref <150)
VLDL: 12 mg/dL (ref 0–40)

## 2011-02-07 LAB — COMPLETE METABOLIC PANEL WITH GFR
Albumin: 4.2 g/dL (ref 3.5–5.2)
CO2: 25 mEq/L (ref 19–32)
Calcium: 9.1 mg/dL (ref 8.4–10.5)
Chloride: 105 mEq/L (ref 96–112)
Creat: 0.7 mg/dL (ref 0.50–1.10)
Potassium: 4.2 mEq/L (ref 3.5–5.3)
Total Bilirubin: 0.3 mg/dL (ref 0.3–1.2)

## 2011-02-08 ENCOUNTER — Other Ambulatory Visit: Payer: Self-pay | Admitting: Family Medicine

## 2011-02-08 ENCOUNTER — Telehealth: Payer: Self-pay | Admitting: Family Medicine

## 2011-02-08 MED ORDER — METOPROLOL SUCCINATE ER 50 MG PO TB24: 50.0000 mg | ORAL_TABLET | Freq: Every day | ORAL | Status: DC

## 2011-02-08 NOTE — Telephone Encounter (Signed)
Message copied by Gifford Shave on Wed Feb 08, 2011  1:18 PM ------      Message from: Milinda Cave, [place] [facility]      Created: Wed Feb 08, 2011  9:09 AM       Pls notify patient that labs two days ago were all normal---PM

## 2011-02-08 NOTE — Telephone Encounter (Signed)
Pt informed by [facility] that all her labs were normal. Jarvis Newcomer, LPN [facility]

## 2011-02-08 NOTE — Telephone Encounter (Signed)
Pt called and said she needs refill of her BP med (metoprolol).  Was suppose to come back to our office to have BP checked, but was seen in OB_GYN and her BP was better at 129/85.   Plan:  Will refill the metoprolol 50 mg daily. #30/1 refill sent electronically and pt informed. Jarvis Newcomer, LPN [facility]

## 2011-02-21 ENCOUNTER — Ambulatory Visit (INDEPENDENT_AMBULATORY_CARE_PROVIDER_SITE_OTHER): Payer: BC Managed Care – PPO | Admitting: Obstetrics & Gynecology

## 2011-02-21 ENCOUNTER — Encounter: Payer: Self-pay | Admitting: Obstetrics & Gynecology

## 2011-02-21 VITALS — BP 141/86 | HR 113 | Temp 98.6°F | Resp 17 | Ht 68.0 in | Wt 198.0 lb

## 2011-02-21 DIAGNOSIS — Z3169 Encounter for other general counseling and advice on procreation: Secondary | ICD-10-CM

## 2011-02-21 NOTE — Progress Notes (Signed)
  Subjective:    Patient ID: [name], female    DOB: [birth year], 30 y.o.   MRN: [id number]  HPI I saw [name] here last month for a preconceptual counseling visit as she was concerned about the status of her fibroids.  She had a myomectomy a little over a year ago followed by a HSG and then a pregnancy.  She and her husband have been trying to conceive for about 2-3 months now.  She does ovulate and used a OPkit.  Her cycles are about q 31 days.   Review of Systems     Objective:   Physical Exam  Her ultrasound shows 2 small fibroids (1.7 cm)      Assessment & Plan:  Desire for pregnancy- She will continue her PNVs and we discussed that the average couple conceives in about a year.

## 2011-02-27 ENCOUNTER — Encounter: Payer: Self-pay | Admitting: *Deleted

## 2011-02-27 ENCOUNTER — Emergency Department (INDEPENDENT_AMBULATORY_CARE_PROVIDER_SITE_OTHER)
Admission: EM | Admit: 2011-02-27 | Discharge: 2011-02-27 | Disposition: A | Discharge status: Home / Self Care | Payer: BC Managed Care – PPO | Source: Home / Self Care | Attending: Family Medicine | Admitting: Family Medicine

## 2011-02-27 DIAGNOSIS — J069 Acute upper respiratory infection, unspecified: Secondary | ICD-10-CM

## 2011-02-27 DIAGNOSIS — J029 Acute pharyngitis, unspecified: Secondary | ICD-10-CM

## 2011-02-27 MED ORDER — BENZONATATE 200 MG PO CAPS: 200.0000 mg | ORAL_CAPSULE | Freq: Every day | ORAL | Status: AC

## 2011-02-27 MED ORDER — AMOXICILLIN 875 MG PO TABS: 875.0000 mg | ORAL_TABLET | Freq: Two times a day (BID) | ORAL | Status: AC

## 2011-02-27 NOTE — ED Notes (Signed)
Pt c/o productive cough, chills, nausea, dizziness,and hoarseness x 2 wks. She has taken Coricidin and zyrtec.

## 2011-02-28 NOTE — ED Provider Notes (Signed)
History     CSN: [id number] Arrival date & time: 02/27/2011  5:44 PM   First MD Initiated Contact with Patient 02/27/11 1818      Chief Complaint  Patient presents with  . Cough    (Consider location/radiation/quality/duration/timing/severity/associated sxs/prior treatment) Patient is a 30 y.o. female presenting with cough. The history is provided by the patient.  Cough This is a new problem. The current episode started 6 to 12 hours ago. The problem occurs hourly. The problem has been gradually worsening. The cough is non-productive. There has been no fever. Associated symptoms include chills, headaches, rhinorrhea, sore throat and myalgias. Pertinent negatives include no chest pain, no shortness of breath and no wheezing. She has tried decongestants for the symptoms. The treatment provided no relief. She is not a smoker.    Past Medical History  Diagnosis Date  . Fibroids     myomectomy  . Hypertension   . IBS (irritable bowel syndrome)   . Herpes genitalia   . Pre-eclampsia     2011    Past Surgical History  Procedure Date  . Dilation and curettage of uterus   . Myomectomy abdominal approach   . Lt breast biopsy   . Dilation and curettage of uterus     x2    Family History  Problem Relation Age of Onset  . Alcohol abuse Father     parent  . Breast cancer Maternal Aunt     2 maternal aunt  . Breast cancer Paternal Aunt     paternal aunt  . Hypertension Maternal Aunt     aunt  . Hypertension Mother     parent  . Heart attack Paternal Uncle     2 uncles  . Stroke Maternal Grandmother     TIA's    History  Substance Use Topics  . Smoking status: Never Smoker   . Smokeless tobacco: Never Used  . Alcohol Use: Yes     rarely    OB History    Grav Para Term Preterm Abortions TAB SAB Ect Mult Living   3 1 0 1 2  2   1       Review of Systems  Constitutional: Positive for chills.  HENT: Positive for sore throat, rhinorrhea and postnasal drip.   Eyes:  Negative for discharge.  Respiratory: Positive for cough. Negative for shortness of breath and wheezing.   Cardiovascular: Negative for chest pain.  Gastrointestinal: Positive for nausea.  Genitourinary: Negative.   Musculoskeletal: Positive for myalgias.  Skin: Negative.   Neurological: Positive for headaches.    Allergies  Guaifenesin & derivatives  Home Medications   Current Outpatient Rx  Name Route Sig Dispense Refill  . CETIRIZINE HCL 10 MG PO TABS Oral Take 10 mg by mouth daily.      . AMOXICILLIN 875 MG PO TABS Oral Take 1 tablet (875 mg total) by mouth 2 (two) times daily. 20 tablet 0    Rx void after 03/07/11  . BENZONATATE 200 MG PO CAPS Oral Take 1 capsule (200 mg total) by mouth at bedtime. 12 capsule 0  . METOPROLOL SUCCINATE 50 MG PO TB24  TAKE ONE TABLET BY MOUTH DAILY 90 tablet 0    **Patient requests 90 day supply**  . PRENATAL/FOLIC ACID PO TABS Oral Take by mouth daily.      . VOL-TAB RX 29-1 MG PO TABS        BP 132/78  Pulse 96  Temp(Src) 98.9 F (37.2 C) (Oral)  Resp 18  Ht 5\' 8"  (1.727 m)  Wt 199 lb 12 oz (90.606 kg)  BMI 30.37 kg/m2  SpO2 99%  LMP 02/26/2011  Breastfeeding? Unknown  Physical Exam  Nursing note and vitals reviewed. Constitutional: She is oriented to person, place, and time. She appears well-developed and well-nourished.  HENT:  Head: Normocephalic and atraumatic.  Right Ear: External ear normal.  Left Ear: External ear normal.  Nose: Nose normal.  Mouth/Throat: Mucous membranes are normal. Posterior oropharyngeal erythema present. No oropharyngeal exudate.  Eyes: Conjunctivae and EOM are normal. Pupils are equal, round, and reactive to light. Right eye exhibits no discharge. Left eye exhibits no discharge.  Neck: Normal range of motion. Neck supple.  Cardiovascular: Normal heart sounds.   Pulmonary/Chest: Effort normal and breath sounds normal. No respiratory distress. She has no wheezes. She has no rales. She exhibits  tenderness.         Distinct tenderness to palpation over sternum  Abdominal: Soft. Bowel sounds are normal. There is no tenderness.  Lymphadenopathy:    She has cervical adenopathy.  Neurological: She is alert and oriented to person, place, and time.  Skin: Skin is warm and dry.    ED Course  Procedures (including critical care time)   Labs Reviewed  POCT RAPID STREP A (OFFICE)  STREP A DNA PROBE   Narrative:    Performed at:  [facility]                [address], [address]                [place], [place] [address]   No results found. Rapid strep test negative   1. Acute upper respiratory infections of unspecified site   2. Acute pharyngitis       MDM  No evidence bacterial infection today.  Suspect viral URI Treat symptomatically for now:  Increase fluid intake, begin expectorant/decongestant (patient states that she does not tolerate high dose guaifenesin, but Robitussin OK),  topical decongestant, cough suppressant at bedtime.  Stop other OTC meds.   If fever/chills persist, or if not improving 5 days begin Amoxicillin (given Rx to hold).  Followup with PCP if not improving 7 to 10 days. May take Ibuprofen 200mg , 4 tabs every 8 hours with food for sternum soreness        Donna Christen, MD 02/28/11 [phone]

## 2011-03-06 ENCOUNTER — Other Ambulatory Visit: Payer: Self-pay | Admitting: *Deleted

## 2011-03-06 ENCOUNTER — Encounter: Payer: Self-pay | Admitting: *Deleted

## 2011-03-06 DIAGNOSIS — A6009 Herpesviral infection of other urogenital tract: Secondary | ICD-10-CM

## 2011-03-06 MED ORDER — VALACYCLOVIR HCL 500 MG PO TABS: 500.0000 mg | ORAL_TABLET | Freq: Every day | ORAL | Status: DC

## 2011-03-27 ENCOUNTER — Encounter: Payer: BC Managed Care – PPO | Admitting: *Deleted

## 2011-03-27 ENCOUNTER — Ambulatory Visit (INDEPENDENT_AMBULATORY_CARE_PROVIDER_SITE_OTHER): Payer: BC Managed Care – PPO | Admitting: *Deleted

## 2011-03-27 VITALS — BP 132/78 | Temp 98.6°F | Wt 200.0 lb

## 2011-03-27 DIAGNOSIS — Z3480 Encounter for supervision of other normal pregnancy, unspecified trimester: Secondary | ICD-10-CM

## 2011-03-27 NOTE — Progress Notes (Signed)
p- 94  Pt here with husband for NOB intake.  Bedside U/S showed  CRL 17.34mm and FHT 166, GA [redacted]w[redacted]d.  PNL drawn and all questions answered.  First trimester information reviewed.  Appt made for prenatal exam in 2 weeks.

## 2011-03-28 LAB — HIV ANTIBODY (ROUTINE TESTING W REFLEX): HIV: NONREACTIVE

## 2011-03-29 LAB — OBSTETRIC PANEL
Antibody Screen: NEGATIVE
Basophils Absolute: 0 10*3/uL (ref 0.0–0.1)
Basophils Relative: 0 % (ref 0–1)
Eosinophils Relative: 2 % (ref 0–5)
HCT: 37.7 % (ref 36.0–46.0)
Hemoglobin: 12.8 g/dL (ref 12.0–15.0)
MCHC: 34 g/dL (ref 30.0–36.0)
MCV: 88.1 fL (ref 78.0–100.0)
Monocytes Absolute: 0.8 10*3/uL (ref 0.1–1.0)
Monocytes Relative: 13 % — ABNORMAL HIGH (ref 3–12)
RDW: 14.3 % (ref 11.5–15.5)
Rh Type: POSITIVE
Rubella: 21.1 IU/mL — ABNORMAL HIGH

## 2011-03-31 LAB — CULTURE, URINE COMPREHENSIVE

## 2011-04-05 ENCOUNTER — Telehealth: Payer: Self-pay | Admitting: *Deleted

## 2011-04-05 MED ORDER — AMOXICILLIN 500 MG PO CAPS: 500.0000 mg | ORAL_CAPSULE | Freq: Three times a day (TID) | ORAL | Status: AC

## 2011-04-05 NOTE — Telephone Encounter (Signed)
Left message on moble phone that she does have a UTI and that RX has been sent to [facility] in [place].

## 2011-04-10 ENCOUNTER — Ambulatory Visit (INDEPENDENT_AMBULATORY_CARE_PROVIDER_SITE_OTHER): Payer: BC Managed Care – PPO | Admitting: Family

## 2011-04-10 ENCOUNTER — Encounter: Payer: Self-pay | Admitting: Family

## 2011-04-10 ENCOUNTER — Encounter: Payer: Self-pay | Admitting: *Deleted

## 2011-04-10 DIAGNOSIS — O3410 Maternal care for benign tumor of corpus uteri, unspecified trimester: Secondary | ICD-10-CM

## 2011-04-10 DIAGNOSIS — Z9889 Other specified postprocedural states: Secondary | ICD-10-CM

## 2011-04-10 DIAGNOSIS — Z3480 Encounter for supervision of other normal pregnancy, unspecified trimester: Secondary | ICD-10-CM

## 2011-04-10 DIAGNOSIS — Z98891 History of uterine scar from previous surgery: Secondary | ICD-10-CM

## 2011-04-10 DIAGNOSIS — A6000 Herpesviral infection of urogenital system, unspecified: Secondary | ICD-10-CM

## 2011-04-10 DIAGNOSIS — D259 Leiomyoma of uterus, unspecified: Secondary | ICD-10-CM

## 2011-04-10 MED ORDER — ONDANSETRON 8 MG PO TBDP: 8.0000 mg | ORAL_TABLET | Freq: Three times a day (TID) | ORAL | Status: AC | PRN

## 2011-04-10 MED ORDER — PRENATABS RX 29-1 MG PO TABS: 1.0000 | ORAL_TABLET | ORAL | Status: DC

## 2011-04-10 NOTE — Progress Notes (Signed)
p-88 Lots of mucous and congestion.

## 2011-04-10 NOTE — Progress Notes (Signed)
New OB visit; reviewed practice information and OB visit schedule; discussed genetic screening, desires integrated.  Reviewed lab results with patient.  Reviewed medical history in detail.  Consulted with Dr. Debroah Loop regarding hypertension and being on metoprolol>dc metoprolol and have pt return in 2 weeks for blood pressure reevaluation.  Obtained baseline PIH labs.  Exam not required due to recently having an annual exam with Dr. Linford Arnold.

## 2011-04-13 LAB — CBC
HCT: 36.7 % (ref 36.0–46.0)
MCH: 29.7 pg (ref 26.0–34.0)
MCHC: 33.2 g/dL (ref 30.0–36.0)
MCV: 89.3 fL (ref 78.0–100.0)
Platelets: 327 10*3/uL (ref 150–400)
RDW: 13.9 % (ref 11.5–15.5)

## 2011-04-13 LAB — PROTEIN, URINE, 24 HOUR: Protein, 24H Urine: 75 mg/d (ref 50–100)

## 2011-04-13 LAB — COMPREHENSIVE METABOLIC PANEL
ALT: 36 U/L — ABNORMAL HIGH (ref 0–35)
AST: 9 U/L (ref 0–37)
Alkaline Phosphatase: 62 U/L (ref 39–117)
CO2: 20 mEq/L (ref 19–32)
Creat: 0.6 mg/dL (ref 0.50–1.10)
Sodium: 134 mEq/L — ABNORMAL LOW (ref 135–145)
Total Bilirubin: 0.2 mg/dL — ABNORMAL LOW (ref 0.3–1.2)
Total Protein: 7.2 g/dL (ref 6.0–8.3)

## 2011-04-13 LAB — CREATININE CLEARANCE, URINE, 24 HOUR
Creatinine Clearance: 212 mL/min — ABNORMAL HIGH (ref 75–115)
Creatinine, 24H Ur: 1836 mg/d — ABNORMAL HIGH (ref 700–1800)
Creatinine: 0.6 mg/dL (ref 0.50–1.10)

## 2011-04-26 ENCOUNTER — Ambulatory Visit (HOSPITAL_COMMUNITY)
Admission: RE | Admit: 2011-04-26 | Discharge: 2011-04-26 | Disposition: A | Discharge status: Home / Self Care | Payer: BC Managed Care – PPO | Source: Ambulatory Visit | Attending: Family | Admitting: Family

## 2011-04-26 ENCOUNTER — Other Ambulatory Visit: Payer: Self-pay | Admitting: Family

## 2011-04-26 ENCOUNTER — Ambulatory Visit (HOSPITAL_COMMUNITY): Admission: RE | Admit: 2011-04-26 | Payer: BC Managed Care – PPO | Source: Ambulatory Visit

## 2011-04-26 ENCOUNTER — Other Ambulatory Visit (HOSPITAL_COMMUNITY): Payer: BC Managed Care – PPO

## 2011-04-26 DIAGNOSIS — O351XX0 Maternal care for (suspected) chromosomal abnormality in fetus, not applicable or unspecified: Secondary | ICD-10-CM | POA: Insufficient documentation

## 2011-04-26 DIAGNOSIS — D259 Leiomyoma of uterus, unspecified: Secondary | ICD-10-CM

## 2011-04-26 DIAGNOSIS — O98519 Other viral diseases complicating pregnancy, unspecified trimester: Secondary | ICD-10-CM | POA: Insufficient documentation

## 2011-04-26 DIAGNOSIS — O10019 Pre-existing essential hypertension complicating pregnancy, unspecified trimester: Secondary | ICD-10-CM | POA: Insufficient documentation

## 2011-04-26 DIAGNOSIS — A6000 Herpesviral infection of urogenital system, unspecified: Secondary | ICD-10-CM | POA: Insufficient documentation

## 2011-04-26 DIAGNOSIS — O09299 Supervision of pregnancy with other poor reproductive or obstetric history, unspecified trimester: Secondary | ICD-10-CM | POA: Insufficient documentation

## 2011-04-26 DIAGNOSIS — Z3689 Encounter for other specified antenatal screening: Secondary | ICD-10-CM | POA: Insufficient documentation

## 2011-04-26 DIAGNOSIS — O3410 Maternal care for benign tumor of corpus uteri, unspecified trimester: Secondary | ICD-10-CM | POA: Insufficient documentation

## 2011-04-26 DIAGNOSIS — O3510X0 Maternal care for (suspected) chromosomal abnormality in fetus, unspecified, not applicable or unspecified: Secondary | ICD-10-CM | POA: Insufficient documentation

## 2011-04-26 DIAGNOSIS — Z36 Encounter for antenatal screening of mother: Secondary | ICD-10-CM

## 2011-04-26 NOTE — Progress Notes (Signed)
Ms. [name] was seen for ultrasound appointment today.  Please see [facility] report for details.

## 2011-04-27 ENCOUNTER — Telehealth: Payer: Self-pay | Admitting: *Deleted

## 2011-04-27 NOTE — Telephone Encounter (Signed)
Pt called with results of her BP @ work today which was 129/81.  She is to keep her B/P checked weekly due to [facility] in her last pregnancy.

## 2011-05-04 ENCOUNTER — Ambulatory Visit (HOSPITAL_COMMUNITY)
Admission: RE | Admit: 2011-05-04 | Discharge: 2011-05-04 | Disposition: A | Discharge status: Home / Self Care | Payer: BC Managed Care – PPO | Source: Ambulatory Visit | Attending: Family | Admitting: Family

## 2011-05-04 ENCOUNTER — Other Ambulatory Visit: Payer: Self-pay

## 2011-05-04 DIAGNOSIS — O10019 Pre-existing essential hypertension complicating pregnancy, unspecified trimester: Secondary | ICD-10-CM | POA: Insufficient documentation

## 2011-05-04 DIAGNOSIS — O351XX0 Maternal care for (suspected) chromosomal abnormality in fetus, not applicable or unspecified: Secondary | ICD-10-CM | POA: Insufficient documentation

## 2011-05-04 DIAGNOSIS — Z36 Encounter for antenatal screening of mother: Secondary | ICD-10-CM

## 2011-05-04 DIAGNOSIS — A6000 Herpesviral infection of urogenital system, unspecified: Secondary | ICD-10-CM | POA: Insufficient documentation

## 2011-05-04 DIAGNOSIS — Z3689 Encounter for other specified antenatal screening: Secondary | ICD-10-CM | POA: Insufficient documentation

## 2011-05-04 DIAGNOSIS — O3410 Maternal care for benign tumor of corpus uteri, unspecified trimester: Secondary | ICD-10-CM | POA: Insufficient documentation

## 2011-05-04 DIAGNOSIS — O3510X0 Maternal care for (suspected) chromosomal abnormality in fetus, unspecified, not applicable or unspecified: Secondary | ICD-10-CM | POA: Insufficient documentation

## 2011-05-04 DIAGNOSIS — O98519 Other viral diseases complicating pregnancy, unspecified trimester: Secondary | ICD-10-CM | POA: Insufficient documentation

## 2011-05-04 NOTE — Progress Notes (Signed)
Ms. Brooks was seen for ultrasound appointment today.  Please see [facility] report for details.  

## 2011-05-09 ENCOUNTER — Ambulatory Visit (INDEPENDENT_AMBULATORY_CARE_PROVIDER_SITE_OTHER): Payer: BC Managed Care – PPO | Admitting: Obstetrics & Gynecology

## 2011-05-09 VITALS — BP 130/79 | Temp 97.6°F | Wt 198.0 lb

## 2011-05-09 DIAGNOSIS — Z3480 Encounter for supervision of other normal pregnancy, unspecified trimester: Secondary | ICD-10-CM

## 2011-05-09 DIAGNOSIS — O169 Unspecified maternal hypertension, unspecified trimester: Secondary | ICD-10-CM

## 2011-05-09 DIAGNOSIS — IMO0002 Reserved for concepts with insufficient information to code with codable children: Secondary | ICD-10-CM

## 2011-05-09 NOTE — Progress Notes (Signed)
She is doing well today. We discussed recommended weight gain of about 20 pounds. I also explained that I fully expect that she will eventually need to restart an antihypertnesive. Her early screen was negative and she will get a MSAFP at 18 weeks., an anatomy scan at 19 weeks.

## 2011-05-09 NOTE — Progress Notes (Signed)
p-104 still occ nausea but does have Zofran prn

## 2011-05-11 ENCOUNTER — Encounter: Payer: Self-pay | Admitting: Obstetrics & Gynecology

## 2011-05-11 DIAGNOSIS — O0990 Supervision of high risk pregnancy, unspecified, unspecified trimester: Secondary | ICD-10-CM | POA: Insufficient documentation

## 2011-05-22 ENCOUNTER — Ambulatory Visit (INDEPENDENT_AMBULATORY_CARE_PROVIDER_SITE_OTHER): Payer: BC Managed Care – PPO | Admitting: Obstetrics and Gynecology

## 2011-05-22 ENCOUNTER — Encounter: Payer: Self-pay | Admitting: Obstetrics and Gynecology

## 2011-05-22 VITALS — BP 123/77 | Temp 98.5°F | Wt 198.0 lb

## 2011-05-22 DIAGNOSIS — Z3480 Encounter for supervision of other normal pregnancy, unspecified trimester: Secondary | ICD-10-CM

## 2011-05-22 NOTE — Progress Notes (Signed)
p=105 

## 2011-05-22 NOTE — Patient Instructions (Signed)
Pregnancy - Second Trimester The second trimester of pregnancy (3 to 6 months) is a period of rapid growth for you and your baby. At the end of the sixth month, your baby is about 9 inches long and weighs 1 1/2 pounds. You will begin to feel the baby move between 18 and 20 weeks of the pregnancy. This is called quickening. Weight gain is faster. A clear fluid (colostrum) may leak out of your breasts. You may feel small contractions of the womb (uterus). This is known as false labor or Braxton-Hicks contractions. This is like a practice for labor when the baby is ready to be born. Usually, the problems with morning sickness have usually passed by the end of your first trimester. Some women develop small dark blotches (called cholasma, mask of pregnancy) on their face that usually goes away after the baby is born. Exposure to the sun makes the blotches worse. Acne may also develop in some pregnant women and pregnant women who have acne, may find that it goes away. PRENATAL EXAMS  Blood work may continue to be done during prenatal exams. These tests are done to check on your health and the probable health of your baby. Blood work is used to follow your blood levels (hemoglobin). Anemia (low hemoglobin) is common during pregnancy. Iron and vitamins are given to help prevent this. You will also be checked for diabetes between 24 and 28 weeks of the pregnancy. Some of the previous blood tests may be repeated.   The size of the uterus is measured during each visit. This is to make sure that the baby is continuing to grow properly according to the dates of the pregnancy.   Your blood pressure is checked every prenatal visit. This is to make sure you are not getting toxemia.   Your urine is checked to make sure you do not have an infection, diabetes or protein in the urine.   Your weight is checked often to make sure gains are happening at the suggested rate. This is to ensure that both you and your baby are  growing normally.   Sometimes, an ultrasound is performed to confirm the proper growth and development of the baby. This is a test which bounces harmless sound waves off the baby so your caregiver can more accurately determine due dates.  Sometimes, a specialized test is done on the amniotic fluid surrounding the baby. This test is called an amniocentesis. The amniotic fluid is obtained by sticking a needle into the belly (abdomen). This is done to check the chromosomes in instances where there is a concern about possible genetic problems with the baby. It is also sometimes done near the end of pregnancy if an early delivery is required. In this case, it is done to help make sure the baby's lungs are mature enough for the baby to live outside of the womb. CHANGES OCCURING IN THE SECOND TRIMESTER OF PREGNANCY Your body goes through many changes during pregnancy. They vary from person to person. Talk to your caregiver about changes you notice that you are concerned about.  During the second trimester, you will likely have an increase in your appetite. It is normal to have cravings for certain foods. This varies from person to person and pregnancy to pregnancy.   Your lower abdomen will begin to bulge.   You may have to urinate more often because the uterus and baby are pressing on your bladder. It is also common to get more bladder infections during pregnancy (  pain with urination). You can help this by drinking lots of fluids and emptying your bladder before and after intercourse.   You may begin to get stretch marks on your hips, abdomen, and breasts. These are normal changes in the body during pregnancy. There are no exercises or medications to take that prevent this change.   You may begin to develop swollen and bulging veins (varicose veins) in your legs. Wearing support hose, elevating your feet for 15 minutes, 3 to 4 times a day and limiting salt in your diet helps lessen the problem.    Heartburn may develop as the uterus grows and pushes up against the stomach. Antacids recommended by your caregiver helps with this problem. Also, eating smaller meals 4 to 5 times a day helps.   Constipation can be treated with a stool softener or adding bulk to your diet. Drinking lots of fluids, vegetables, fruits, and whole grains are helpful.   Exercising is also helpful. If you have been very active up until your pregnancy, most of these activities can be continued during your pregnancy. If you have been less active, it is helpful to start an exercise program such as walking.   Hemorrhoids (varicose veins in the rectum) may develop at the end of the second trimester. Warm sitz baths and hemorrhoid cream recommended by your caregiver helps hemorrhoid problems.   Backaches may develop during this time of your pregnancy. Avoid heavy lifting, wear low heal shoes and practice good posture to help with backache problems.   Some pregnant women develop tingling and numbness of their hand and fingers because of swelling and tightening of ligaments in the wrist (carpel tunnel syndrome). This goes away after the baby is born.   As your breasts enlarge, you may have to get a bigger bra. Get a comfortable, cotton, support bra. Do not get a nursing bra until the last month of the pregnancy if you will be nursing the baby.   You may get a dark line from your belly button to the pubic area called the linea nigra.   You may develop rosy cheeks because of increase blood flow to the face.   You may develop spider looking lines of the face, neck, arms and chest. These go away after the baby is born.  HOME CARE INSTRUCTIONS   It is extremely important to avoid all smoking, herbs, alcohol, and unprescribed drugs during your pregnancy. These chemicals affect the formation and growth of the baby. Avoid these chemicals throughout the pregnancy to ensure the delivery of a healthy infant.   Most of your home  care instructions are the same as suggested for the first trimester of your pregnancy. Keep your caregiver's appointments. Follow your caregiver's instructions regarding medication use, exercise and diet.   During pregnancy, you are providing food for you and your baby. Continue to eat regular, well-balanced meals. Choose foods such as meat, fish, milk and other low fat dairy products, vegetables, fruits, and whole-grain breads and cereals. Your caregiver will tell you of the ideal weight gain.   A physical sexual relationship may be continued up until near the end of pregnancy if there are no other problems. Problems could include early (premature) leaking of amniotic fluid from the membranes, vaginal bleeding, abdominal pain, or other medical or pregnancy problems.   Exercise regularly if there are no restrictions. Check with your caregiver if you are unsure of the safety of some of your exercises. The greatest weight gain will occur in the   last 2 trimesters of pregnancy. Exercise will help you:   Control your weight.   Get you in shape for labor and delivery.   Lose weight after you have the baby.   Wear a good support or jogging bra for breast tenderness during pregnancy. This may help if worn during sleep. Pads or tissues may be used in the bra if you are leaking colostrum.   Do not use hot tubs, steam rooms or saunas throughout the pregnancy.   Wear your seat belt at all times when driving. This protects you and your baby if you are in an accident.   Avoid raw meat, uncooked cheese, cat litter boxes and soil used by cats. These carry germs that can cause birth defects in the baby.   The second trimester is also a good time to visit your dentist for your dental health if this has not been done yet. Getting your teeth cleaned is OK. Use a soft toothbrush. Brush gently during pregnancy.   It is easier to loose urine during pregnancy. Tightening up and strengthening the pelvic muscles will  help with this problem. Practice stopping your urination while you are going to the bathroom. These are the same muscles you need to strengthen. It is also the muscles you would use as if you were trying to stop from passing gas. You can practice tightening these muscles up 10 times a set and repeating this about 3 times per day. Once you know what muscles to tighten up, do not perform these exercises during urination. It is more likely to contribute to an infection by backing up the urine.   Ask for help if you have financial, counseling or nutritional needs during pregnancy. Your caregiver will be able to offer counseling for these needs as well as refer you for other special needs.   Your skin may become oily. If so, wash your face with mild soap, use non-greasy moisturizer and oil or cream based makeup.  MEDICATIONS AND DRUG USE IN PREGNANCY  Take prenatal vitamins as directed. The vitamin should contain 1 milligram of folic acid. Keep all vitamins out of reach of children. Only a couple vitamins or tablets containing iron may be fatal to a baby or young child when ingested.   Avoid use of all medications, including herbs, over-the-counter medications, not prescribed or suggested by your caregiver. Only take over-the-counter or prescription medicines for pain, discomfort, or fever as directed by your caregiver. Do not use aspirin.   Let your caregiver also know about herbs you may be using.   Alcohol is related to a number of birth defects. This includes fetal alcohol syndrome. All alcohol, in any form, should be avoided completely. Smoking will cause low birth rate and premature babies.   Street or illegal drugs are very harmful to the baby. They are absolutely forbidden. A baby born to an addicted mother will be addicted at birth. The baby will go through the same withdrawal an adult does.  SEEK MEDICAL CARE IF:  You have any concerns or worries during your pregnancy. It is better to call with  your questions if you feel they cannot wait, rather than worry about them. SEEK IMMEDIATE MEDICAL CARE IF:   An unexplained oral temperature above 102 F (38.9 C) develops, or as your caregiver suggests.   You have leaking of fluid from the vagina (birth canal). If leaking membranes are suspected, take your temperature and tell your caregiver of this when you call.   There   is vaginal spotting, bleeding, or passing clots. Tell your caregiver of the amount and how many pads are used. Light spotting in pregnancy is common, especially following intercourse.   You develop a bad smelling vaginal discharge with a change in the color from clear to white.   You continue to feel sick to your stomach (nauseated) and have no relief from remedies suggested. You vomit blood or coffee ground-like materials.   You lose more than 2 pounds of weight or gain more than 2 pounds of weight over 1 week, or as suggested by your caregiver.   You notice swelling of your face, hands, feet, or legs.   You get exposed to German measles and have never had them.   You are exposed to fifth disease or chickenpox.   You develop belly (abdominal) pain. Round ligament discomfort is a common non-cancerous (benign) cause of abdominal pain in pregnancy. Your caregiver still must evaluate you.   You develop a bad headache that does not go away.   You develop fever, diarrhea, pain with urination, or shortness of breath.   You develop visual problems, blurry, or double vision.   You fall or are in a car accident or any kind of trauma.   There is mental or physical violence at home.  Document Released: 04/04/2001 Document Revised: 12/21/2010 Document Reviewed: 10/07/2008 [employer] Patient Information 2012 [employer]. 

## 2011-05-22 NOTE — Progress Notes (Signed)
Doing well. Antihypertensives were stopped after 6 wks post op C/S and remains normotensive. 24 hr urine pro 75 mg baseline, creat 0.6. NT nl. MSAFP today. Anatomic screen scheduled. Nausea improved.

## 2011-05-31 ENCOUNTER — Telehealth: Payer: Self-pay | Admitting: *Deleted

## 2011-05-31 ENCOUNTER — Encounter: Payer: Self-pay | Admitting: Obstetrics & Gynecology

## 2011-05-31 NOTE — Telephone Encounter (Signed)
Pt notified of normal AFP only.

## 2011-06-05 ENCOUNTER — Ambulatory Visit (HOSPITAL_COMMUNITY)
Admission: RE | Admit: 2011-06-05 | Discharge: 2011-06-05 | Disposition: A | Discharge status: Home / Self Care | Payer: BC Managed Care – PPO | Source: Ambulatory Visit | Attending: Obstetrics and Gynecology | Admitting: Obstetrics and Gynecology

## 2011-06-05 DIAGNOSIS — Z3480 Encounter for supervision of other normal pregnancy, unspecified trimester: Secondary | ICD-10-CM

## 2011-06-05 DIAGNOSIS — O358XX0 Maternal care for other (suspected) fetal abnormality and damage, not applicable or unspecified: Secondary | ICD-10-CM | POA: Insufficient documentation

## 2011-06-05 DIAGNOSIS — O34219 Maternal care for unspecified type scar from previous cesarean delivery: Secondary | ICD-10-CM | POA: Insufficient documentation

## 2011-06-05 DIAGNOSIS — O10019 Pre-existing essential hypertension complicating pregnancy, unspecified trimester: Secondary | ICD-10-CM | POA: Insufficient documentation

## 2011-06-05 DIAGNOSIS — O09299 Supervision of pregnancy with other poor reproductive or obstetric history, unspecified trimester: Secondary | ICD-10-CM | POA: Insufficient documentation

## 2011-06-05 DIAGNOSIS — Z363 Encounter for antenatal screening for malformations: Secondary | ICD-10-CM | POA: Insufficient documentation

## 2011-06-05 DIAGNOSIS — O3410 Maternal care for benign tumor of corpus uteri, unspecified trimester: Secondary | ICD-10-CM | POA: Insufficient documentation

## 2011-06-05 DIAGNOSIS — Z1389 Encounter for screening for other disorder: Secondary | ICD-10-CM | POA: Insufficient documentation

## 2011-06-09 ENCOUNTER — Ambulatory Visit (INDEPENDENT_AMBULATORY_CARE_PROVIDER_SITE_OTHER): Payer: BC Managed Care – PPO | Admitting: Advanced Practice Midwife

## 2011-06-09 DIAGNOSIS — O0990 Supervision of high risk pregnancy, unspecified, unspecified trimester: Secondary | ICD-10-CM

## 2011-06-09 NOTE — Patient Instructions (Signed)
Pregnancy - Second Trimester The second trimester of pregnancy (3 to 6 months) is a period of rapid growth for you and your baby. At the end of the sixth month, your baby is about 9 inches long and weighs 1 1/2 pounds. You will begin to feel the baby move between 18 and 20 weeks of the pregnancy. This is called quickening. Weight gain is faster. A clear fluid (colostrum) may leak out of your breasts. You may feel small contractions of the womb (uterus). This is known as false labor or Braxton-Hicks contractions. This is like a practice for labor when the baby is ready to be born. Usually, the problems with morning sickness have usually passed by the end of your first trimester. Some women develop small dark blotches (called cholasma, mask of pregnancy) on their face that usually goes away after the baby is born. Exposure to the sun makes the blotches worse. Acne may also develop in some pregnant women and pregnant women who have acne, may find that it goes away. PRENATAL EXAMS  Blood work may continue to be done during prenatal exams. These tests are done to check on your health and the probable health of your baby. Blood work is used to follow your blood levels (hemoglobin). Anemia (low hemoglobin) is common during pregnancy. Iron and vitamins are given to help prevent this. You will also be checked for diabetes between 24 and 28 weeks of the pregnancy. Some of the previous blood tests may be repeated.   The size of the uterus is measured during each visit. This is to make sure that the baby is continuing to grow properly according to the dates of the pregnancy.   Your blood pressure is checked every prenatal visit. This is to make sure you are not getting toxemia.   Your urine is checked to make sure you do not have an infection, diabetes or protein in the urine.   Your weight is checked often to make sure gains are happening at the suggested rate. This is to ensure that both you and your baby are  growing normally.   Sometimes, an ultrasound is performed to confirm the proper growth and development of the baby. This is a test which bounces harmless sound waves off the baby so your caregiver can more accurately determine due dates.  Sometimes, a specialized test is done on the amniotic fluid surrounding the baby. This test is called an amniocentesis. The amniotic fluid is obtained by sticking a needle into the belly (abdomen). This is done to check the chromosomes in instances where there is a concern about possible genetic problems with the baby. It is also sometimes done near the end of pregnancy if an early delivery is required. In this case, it is done to help make sure the baby's lungs are mature enough for the baby to live outside of the womb. CHANGES OCCURING IN THE SECOND TRIMESTER OF PREGNANCY Your body goes through many changes during pregnancy. They vary from person to person. Talk to your caregiver about changes you notice that you are concerned about.  During the second trimester, you will likely have an increase in your appetite. It is normal to have cravings for certain foods. This varies from person to person and pregnancy to pregnancy.   Your lower abdomen will begin to bulge.   You may have to urinate more often because the uterus and baby are pressing on your bladder. It is also common to get more bladder infections during pregnancy (  pain with urination). You can help this by drinking lots of fluids and emptying your bladder before and after intercourse.   You may begin to get stretch marks on your hips, abdomen, and breasts. These are normal changes in the body during pregnancy. There are no exercises or medications to take that prevent this change.   You may begin to develop swollen and bulging veins (varicose veins) in your legs. Wearing support hose, elevating your feet for 15 minutes, 3 to 4 times a day and limiting salt in your diet helps lessen the problem.    Heartburn may develop as the uterus grows and pushes up against the stomach. Antacids recommended by your caregiver helps with this problem. Also, eating smaller meals 4 to 5 times a day helps.   Constipation can be treated with a stool softener or adding bulk to your diet. Drinking lots of fluids, vegetables, fruits, and whole grains are helpful.   Exercising is also helpful. If you have been very active up until your pregnancy, most of these activities can be continued during your pregnancy. If you have been less active, it is helpful to start an exercise program such as walking.   Hemorrhoids (varicose veins in the rectum) may develop at the end of the second trimester. Warm sitz baths and hemorrhoid cream recommended by your caregiver helps hemorrhoid problems.   Backaches may develop during this time of your pregnancy. Avoid heavy lifting, wear low heal shoes and practice good posture to help with backache problems.   Some pregnant women develop tingling and numbness of their hand and fingers because of swelling and tightening of ligaments in the wrist (carpel tunnel syndrome). This goes away after the baby is born.   As your breasts enlarge, you may have to get a bigger bra. Get a comfortable, cotton, support bra. Do not get a nursing bra until the last month of the pregnancy if you will be nursing the baby.   You may get a dark line from your belly button to the pubic area called the linea nigra.   You may develop rosy cheeks because of increase blood flow to the face.   You may develop spider looking lines of the face, neck, arms and chest. These go away after the baby is born.  HOME CARE INSTRUCTIONS   It is extremely important to avoid all smoking, herbs, alcohol, and unprescribed drugs during your pregnancy. These chemicals affect the formation and growth of the baby. Avoid these chemicals throughout the pregnancy to ensure the delivery of a healthy infant.   Most of your home  care instructions are the same as suggested for the first trimester of your pregnancy. Keep your caregiver's appointments. Follow your caregiver's instructions regarding medication use, exercise and diet.   During pregnancy, you are providing food for you and your baby. Continue to eat regular, well-balanced meals. Choose foods such as meat, fish, milk and other low fat dairy products, vegetables, fruits, and whole-grain breads and cereals. Your caregiver will tell you of the ideal weight gain.   A physical sexual relationship may be continued up until near the end of pregnancy if there are no other problems. Problems could include early (premature) leaking of amniotic fluid from the membranes, vaginal bleeding, abdominal pain, or other medical or pregnancy problems.   Exercise regularly if there are no restrictions. Check with your caregiver if you are unsure of the safety of some of your exercises. The greatest weight gain will occur in the   last 2 trimesters of pregnancy. Exercise will help you:   Control your weight.   Get you in shape for labor and delivery.   Lose weight after you have the baby.   Wear a good support or jogging bra for breast tenderness during pregnancy. This may help if worn during sleep. Pads or tissues may be used in the bra if you are leaking colostrum.   Do not use hot tubs, steam rooms or saunas throughout the pregnancy.   Wear your seat belt at all times when driving. This protects you and your baby if you are in an accident.   Avoid raw meat, uncooked cheese, cat litter boxes and soil used by cats. These carry germs that can cause birth defects in the baby.   The second trimester is also a good time to visit your dentist for your dental health if this has not been done yet. Getting your teeth cleaned is OK. Use a soft toothbrush. Brush gently during pregnancy.   It is easier to loose urine during pregnancy. Tightening up and strengthening the pelvic muscles will  help with this problem. Practice stopping your urination while you are going to the bathroom. These are the same muscles you need to strengthen. It is also the muscles you would use as if you were trying to stop from passing gas. You can practice tightening these muscles up 10 times a set and repeating this about 3 times per day. Once you know what muscles to tighten up, do not perform these exercises during urination. It is more likely to contribute to an infection by backing up the urine.   Ask for help if you have financial, counseling or nutritional needs during pregnancy. Your caregiver will be able to offer counseling for these needs as well as refer you for other special needs.   Your skin may become oily. If so, wash your face with mild soap, use non-greasy moisturizer and oil or cream based makeup.  MEDICATIONS AND DRUG USE IN PREGNANCY  Take prenatal vitamins as directed. The vitamin should contain 1 milligram of folic acid. Keep all vitamins out of reach of children. Only a couple vitamins or tablets containing iron may be fatal to a baby or young child when ingested.   Avoid use of all medications, including herbs, over-the-counter medications, not prescribed or suggested by your caregiver. Only take over-the-counter or prescription medicines for pain, discomfort, or fever as directed by your caregiver. Do not use aspirin.   Let your caregiver also know about herbs you may be using.   Alcohol is related to a number of birth defects. This includes fetal alcohol syndrome. All alcohol, in any form, should be avoided completely. Smoking will cause low birth rate and premature babies.   Street or illegal drugs are very harmful to the baby. They are absolutely forbidden. A baby born to an addicted mother will be addicted at birth. The baby will go through the same withdrawal an adult does.  SEEK MEDICAL CARE IF:  You have any concerns or worries during your pregnancy. It is better to call with  your questions if you feel they cannot wait, rather than worry about them. SEEK IMMEDIATE MEDICAL CARE IF:   An unexplained oral temperature above 102 F (38.9 C) develops, or as your caregiver suggests.   You have leaking of fluid from the vagina (birth canal). If leaking membranes are suspected, take your temperature and tell your caregiver of this when you call.   There   is vaginal spotting, bleeding, or passing clots. Tell your caregiver of the amount and how many pads are used. Light spotting in pregnancy is common, especially following intercourse.   You develop a bad smelling vaginal discharge with a change in the color from clear to white.   You continue to feel sick to your stomach (nauseated) and have no relief from remedies suggested. You vomit blood or coffee ground-like materials.   You lose more than 2 pounds of weight or gain more than 2 pounds of weight over 1 week, or as suggested by your caregiver.   You notice swelling of your face, hands, feet, or legs.   You get exposed to German measles and have never had them.   You are exposed to fifth disease or chickenpox.   You develop belly (abdominal) pain. Round ligament discomfort is a common non-cancerous (benign) cause of abdominal pain in pregnancy. Your caregiver still must evaluate you.   You develop a bad headache that does not go away.   You develop fever, diarrhea, pain with urination, or shortness of breath.   You develop visual problems, blurry, or double vision.   You fall or are in a car accident or any kind of trauma.   There is mental or physical violence at home.  Document Released: 04/04/2001 Document Revised: 12/21/2010 Document Reviewed: 10/07/2008 [employer] Patient Information 2012 [employer]. 

## 2011-06-09 NOTE — Progress Notes (Signed)
Well, no c/o. Anat u/s WNL, AFP negative.

## 2011-06-09 NOTE — Progress Notes (Signed)
p-Pt is having a girl!

## 2011-06-26 ENCOUNTER — Encounter: Payer: Self-pay | Admitting: Obstetrics and Gynecology

## 2011-06-26 ENCOUNTER — Ambulatory Visit (INDEPENDENT_AMBULATORY_CARE_PROVIDER_SITE_OTHER): Payer: BC Managed Care – PPO | Admitting: Obstetrics and Gynecology

## 2011-06-26 VITALS — BP 132/79 | Temp 97.1°F | Wt 198.0 lb

## 2011-06-26 DIAGNOSIS — J069 Acute upper respiratory infection, unspecified: Secondary | ICD-10-CM

## 2011-06-26 DIAGNOSIS — Z3480 Encounter for supervision of other normal pregnancy, unspecified trimester: Secondary | ICD-10-CM

## 2011-06-26 NOTE — Patient Instructions (Signed)

## 2011-06-26 NOTE — Progress Notes (Signed)
p-[id number]  Still having congestion and N&V

## 2011-06-26 NOTE — Progress Notes (Signed)
Protracted URI- discussed general measures -- difficult to get enough fluids and rest due to job and 31 yo. Dry cough. Lungs clear throughout. Continue symptomatic treatments.

## 2011-07-10 ENCOUNTER — Ambulatory Visit (INDEPENDENT_AMBULATORY_CARE_PROVIDER_SITE_OTHER): Payer: BC Managed Care – PPO | Admitting: Physician Assistant

## 2011-07-10 DIAGNOSIS — O34219 Maternal care for unspecified type scar from previous cesarean delivery: Secondary | ICD-10-CM

## 2011-07-10 DIAGNOSIS — J309 Allergic rhinitis, unspecified: Secondary | ICD-10-CM

## 2011-07-10 DIAGNOSIS — Z3480 Encounter for supervision of other normal pregnancy, unspecified trimester: Secondary | ICD-10-CM

## 2011-07-10 MED ORDER — LEVOCETIRIZINE DIHYDROCHLORIDE 5 MG PO TABS: 5.0000 mg | ORAL_TABLET | Freq: Every evening | ORAL | Status: DC

## 2011-07-10 MED ORDER — FAMOTIDINE 20 MG PO TABS: 20.0000 mg | ORAL_TABLET | Freq: Two times a day (BID) | ORAL | Status: DC

## 2011-07-10 NOTE — Progress Notes (Signed)
p-95  Pt treated self with Monistat 3.  Wants referral to ENT for Sinus.

## 2011-07-10 NOTE — Patient Instructions (Signed)

## 2011-07-10 NOTE — Progress Notes (Signed)
Continued c/o of increased mucus causing vomiting daily. Request referral to ENT. Recommend pepcid BID and will start xyzal 5mg  daily. Pt with hx of myomectomy and c/s. ROI today for records.

## 2011-07-14 NOTE — Progress Notes (Signed)
This encounter was created in error - please disregard.

## 2011-08-07 ENCOUNTER — Ambulatory Visit (INDEPENDENT_AMBULATORY_CARE_PROVIDER_SITE_OTHER): Payer: BC Managed Care – PPO | Admitting: Advanced Practice Midwife

## 2011-08-07 VITALS — BP 133/88 | Wt 203.0 lb

## 2011-08-07 DIAGNOSIS — Z3480 Encounter for supervision of other normal pregnancy, unspecified trimester: Secondary | ICD-10-CM

## 2011-08-07 NOTE — Patient Instructions (Signed)
Pregnancy - Second Trimester The second trimester of pregnancy (3 to 6 months) is a period of rapid growth for you and your baby. At the end of the sixth month, your baby is about 9 inches long and weighs 1 1/2 pounds. You will begin to feel the baby move between 18 and 20 weeks of the pregnancy. This is called quickening. Weight gain is faster. A clear fluid (colostrum) may leak out of your breasts. You may feel small contractions of the womb (uterus). This is known as false labor or Braxton-Hicks contractions. This is like a practice for labor when the baby is ready to be born. Usually, the problems with morning sickness have usually passed by the end of your first trimester. Some women develop small dark blotches (called cholasma, mask of pregnancy) on their face that usually goes away after the baby is born. Exposure to the sun makes the blotches worse. Acne may also develop in some pregnant women and pregnant women who have acne, may find that it goes away. PRENATAL EXAMS  Blood work may continue to be done during prenatal exams. These tests are done to check on your health and the probable health of your baby. Blood work is used to follow your blood levels (hemoglobin). Anemia (low hemoglobin) is common during pregnancy. Iron and vitamins are given to help prevent this. You will also be checked for diabetes between 24 and 28 weeks of the pregnancy. Some of the previous blood tests may be repeated.   The size of the uterus is measured during each visit. This is to make sure that the baby is continuing to grow properly according to the dates of the pregnancy.   Your blood pressure is checked every prenatal visit. This is to make sure you are not getting toxemia.   Your urine is checked to make sure you do not have an infection, diabetes or protein in the urine.   Your weight is checked often to make sure gains are happening at the suggested rate. This is to ensure that both you and your baby are  growing normally.   Sometimes, an ultrasound is performed to confirm the proper growth and development of the baby. This is a test which bounces harmless sound waves off the baby so your caregiver can more accurately determine due dates.  Sometimes, a specialized test is done on the amniotic fluid surrounding the baby. This test is called an amniocentesis. The amniotic fluid is obtained by sticking a needle into the belly (abdomen). This is done to check the chromosomes in instances where there is a concern about possible genetic problems with the baby. It is also sometimes done near the end of pregnancy if an early delivery is required. In this case, it is done to help make sure the baby's lungs are mature enough for the baby to live outside of the womb. CHANGES OCCURING IN THE SECOND TRIMESTER OF PREGNANCY Your body goes through many changes during pregnancy. They vary from person to person. Talk to your caregiver about changes you notice that you are concerned about.  During the second trimester, you will likely have an increase in your appetite. It is normal to have cravings for certain foods. This varies from person to person and pregnancy to pregnancy.   Your lower abdomen will begin to bulge.   You may have to urinate more often because the uterus and baby are pressing on your bladder. It is also common to get more bladder infections during pregnancy (  pain with urination). You can help this by drinking lots of fluids and emptying your bladder before and after intercourse.   You may begin to get stretch marks on your hips, abdomen, and breasts. These are normal changes in the body during pregnancy. There are no exercises or medications to take that prevent this change.   You may begin to develop swollen and bulging veins (varicose veins) in your legs. Wearing support hose, elevating your feet for 15 minutes, 3 to 4 times a day and limiting salt in your diet helps lessen the problem.    Heartburn may develop as the uterus grows and pushes up against the stomach. Antacids recommended by your caregiver helps with this problem. Also, eating smaller meals 4 to 5 times a day helps.   Constipation can be treated with a stool softener or adding bulk to your diet. Drinking lots of fluids, vegetables, fruits, and whole grains are helpful.   Exercising is also helpful. If you have been very active up until your pregnancy, most of these activities can be continued during your pregnancy. If you have been less active, it is helpful to start an exercise program such as walking.   Hemorrhoids (varicose veins in the rectum) may develop at the end of the second trimester. Warm sitz baths and hemorrhoid cream recommended by your caregiver helps hemorrhoid problems.   Backaches may develop during this time of your pregnancy. Avoid heavy lifting, wear low heal shoes and practice good posture to help with backache problems.   Some pregnant women develop tingling and numbness of their hand and fingers because of swelling and tightening of ligaments in the wrist (carpel tunnel syndrome). This goes away after the baby is born.   As your breasts enlarge, you may have to get a bigger bra. Get a comfortable, cotton, support bra. Do not get a nursing bra until the last month of the pregnancy if you will be nursing the baby.   You may get a dark line from your belly button to the pubic area called the linea nigra.   You may develop rosy cheeks because of increase blood flow to the face.   You may develop spider looking lines of the face, neck, arms and chest. These go away after the baby is born.  HOME CARE INSTRUCTIONS   It is extremely important to avoid all smoking, herbs, alcohol, and unprescribed drugs during your pregnancy. These chemicals affect the formation and growth of the baby. Avoid these chemicals throughout the pregnancy to ensure the delivery of a healthy infant.   Most of your home  care instructions are the same as suggested for the first trimester of your pregnancy. Keep your caregiver's appointments. Follow your caregiver's instructions regarding medication use, exercise and diet.   During pregnancy, you are providing food for you and your baby. Continue to eat regular, well-balanced meals. Choose foods such as meat, fish, milk and other low fat dairy products, vegetables, fruits, and whole-grain breads and cereals. Your caregiver will tell you of the ideal weight gain.   A physical sexual relationship may be continued up until near the end of pregnancy if there are no other problems. Problems could include early (premature) leaking of amniotic fluid from the membranes, vaginal bleeding, abdominal pain, or other medical or pregnancy problems.   Exercise regularly if there are no restrictions. Check with your caregiver if you are unsure of the safety of some of your exercises. The greatest weight gain will occur in the   last 2 trimesters of pregnancy. Exercise will help you:   Control your weight.   Get you in shape for labor and delivery.   Lose weight after you have the baby.   Wear a good support or jogging bra for breast tenderness during pregnancy. This may help if worn during sleep. Pads or tissues may be used in the bra if you are leaking colostrum.   Do not use hot tubs, steam rooms or saunas throughout the pregnancy.   Wear your seat belt at all times when driving. This protects you and your baby if you are in an accident.   Avoid raw meat, uncooked cheese, cat litter boxes and soil used by cats. These carry germs that can cause birth defects in the baby.   The second trimester is also a good time to visit your dentist for your dental health if this has not been done yet. Getting your teeth cleaned is OK. Use a soft toothbrush. Brush gently during pregnancy.   It is easier to loose urine during pregnancy. Tightening up and strengthening the pelvic muscles will  help with this problem. Practice stopping your urination while you are going to the bathroom. These are the same muscles you need to strengthen. It is also the muscles you would use as if you were trying to stop from passing gas. You can practice tightening these muscles up 10 times a set and repeating this about 3 times per day. Once you know what muscles to tighten up, do not perform these exercises during urination. It is more likely to contribute to an infection by backing up the urine.   Ask for help if you have financial, counseling or nutritional needs during pregnancy. Your caregiver will be able to offer counseling for these needs as well as refer you for other special needs.   Your skin may become oily. If so, wash your face with mild soap, use non-greasy moisturizer and oil or cream based makeup.  MEDICATIONS AND DRUG USE IN PREGNANCY  Take prenatal vitamins as directed. The vitamin should contain 1 milligram of folic acid. Keep all vitamins out of reach of children. Only a couple vitamins or tablets containing iron may be fatal to a baby or young child when ingested.   Avoid use of all medications, including herbs, over-the-counter medications, not prescribed or suggested by your caregiver. Only take over-the-counter or prescription medicines for pain, discomfort, or fever as directed by your caregiver. Do not use aspirin.   Let your caregiver also know about herbs you may be using.   Alcohol is related to a number of birth defects. This includes fetal alcohol syndrome. All alcohol, in any form, should be avoided completely. Smoking will cause low birth rate and premature babies.   Street or illegal drugs are very harmful to the baby. They are absolutely forbidden. A baby born to an addicted mother will be addicted at birth. The baby will go through the same withdrawal an adult does.  SEEK MEDICAL CARE IF:  You have any concerns or worries during your pregnancy. It is better to call with  your questions if you feel they cannot wait, rather than worry about them. SEEK IMMEDIATE MEDICAL CARE IF:   An unexplained oral temperature above 102 F (38.9 C) develops, or as your caregiver suggests.   You have leaking of fluid from the vagina (birth canal). If leaking membranes are suspected, take your temperature and tell your caregiver of this when you call.   There   is vaginal spotting, bleeding, or passing clots. Tell your caregiver of the amount and how many pads are used. Light spotting in pregnancy is common, especially following intercourse.   You develop a bad smelling vaginal discharge with a change in the color from clear to white.   You continue to feel sick to your stomach (nauseated) and have no relief from remedies suggested. You vomit blood or coffee ground-like materials.   You lose more than 2 pounds of weight or gain more than 2 pounds of weight over 1 week, or as suggested by your caregiver.   You notice swelling of your face, hands, feet, or legs.   You get exposed to German measles and have never had them.   You are exposed to fifth disease or chickenpox.   You develop belly (abdominal) pain. Round ligament discomfort is a common non-cancerous (benign) cause of abdominal pain in pregnancy. Your caregiver still must evaluate you.   You develop a bad headache that does not go away.   You develop fever, diarrhea, pain with urination, or shortness of breath.   You develop visual problems, blurry, or double vision.   You fall or are in a car accident or any kind of trauma.   There is mental or physical violence at home.  Document Released: 04/04/2001 Document Revised: 03/30/2011 Document Reviewed: 10/07/2008 [employer] Patient Information 2012 [employer]. 

## 2011-08-07 NOTE — Progress Notes (Signed)
Doing well except leg cramps.Discussed stretching.

## 2011-08-07 NOTE — Progress Notes (Signed)
p-[id number]

## 2011-08-08 LAB — CBC
Platelets: 272 10*3/uL (ref 150–400)
RBC: 4.15 MIL/uL (ref 3.87–5.11)
RDW: 14.5 % (ref 11.5–15.5)
WBC: 5.1 10*3/uL (ref 4.0–10.5)

## 2011-08-08 LAB — GLUCOSE TOLERANCE, 1 HOUR: Glucose, 1 Hour GTT: 109 mg/dL (ref 70–140)

## 2011-08-09 LAB — RPR

## 2011-08-21 ENCOUNTER — Ambulatory Visit (INDEPENDENT_AMBULATORY_CARE_PROVIDER_SITE_OTHER): Payer: BC Managed Care – PPO | Admitting: Obstetrics and Gynecology

## 2011-08-21 VITALS — BP 123/80 | Temp 98.5°F | Wt 205.0 lb

## 2011-08-21 DIAGNOSIS — O0990 Supervision of high risk pregnancy, unspecified, unspecified trimester: Secondary | ICD-10-CM

## 2011-08-21 DIAGNOSIS — Z9889 Other specified postprocedural states: Secondary | ICD-10-CM

## 2011-08-21 DIAGNOSIS — O3410 Maternal care for benign tumor of corpus uteri, unspecified trimester: Secondary | ICD-10-CM

## 2011-08-21 DIAGNOSIS — D259 Leiomyoma of uterus, unspecified: Secondary | ICD-10-CM

## 2011-08-21 DIAGNOSIS — Z98891 History of uterine scar from previous surgery: Secondary | ICD-10-CM

## 2011-08-21 NOTE — Progress Notes (Signed)
Doing well. Requests Dr. Marice Potter for repeat c-section. Try local ice rub or heat to S-I joints where having discomfort with sitting.

## 2011-08-21 NOTE — Patient Instructions (Signed)
Pregnancy - Second Trimester The second trimester of pregnancy (3 to 6 months) is a period of rapid growth for you and your baby. At the end of the sixth month, your baby is about 9 inches long and weighs 1 1/2 pounds. You will begin to feel the baby move between 18 and 20 weeks of the pregnancy. This is called quickening. Weight gain is faster. A clear fluid (colostrum) may leak out of your breasts. You may feel small contractions of the womb (uterus). This is known as false labor or Braxton-Hicks contractions. This is like a practice for labor when the baby is ready to be born. Usually, the problems with morning sickness have usually passed by the end of your first trimester. Some women develop small dark blotches (called cholasma, mask of pregnancy) on their face that usually goes away after the baby is born. Exposure to the sun makes the blotches worse. Acne may also develop in some pregnant women and pregnant women who have acne, may find that it goes away. PRENATAL EXAMS  Blood work may continue to be done during prenatal exams. These tests are done to check on your health and the probable health of your baby. Blood work is used to follow your blood levels (hemoglobin). Anemia (low hemoglobin) is common during pregnancy. Iron and vitamins are given to help prevent this. You will also be checked for diabetes between 24 and 28 weeks of the pregnancy. Some of the previous blood tests may be repeated.   The size of the uterus is measured during each visit. This is to make sure that the baby is continuing to grow properly according to the dates of the pregnancy.   Your blood pressure is checked every prenatal visit. This is to make sure you are not getting toxemia.   Your urine is checked to make sure you do not have an infection, diabetes or protein in the urine.   Your weight is checked often to make sure gains are happening at the suggested rate. This is to ensure that both you and your baby are  growing normally.   Sometimes, an ultrasound is performed to confirm the proper growth and development of the baby. This is a test which bounces harmless sound waves off the baby so your caregiver can more accurately determine due dates.  Sometimes, a specialized test is done on the amniotic fluid surrounding the baby. This test is called an amniocentesis. The amniotic fluid is obtained by sticking a needle into the belly (abdomen). This is done to check the chromosomes in instances where there is a concern about possible genetic problems with the baby. It is also sometimes done near the end of pregnancy if an early delivery is required. In this case, it is done to help make sure the baby's lungs are mature enough for the baby to live outside of the womb. CHANGES OCCURING IN THE SECOND TRIMESTER OF PREGNANCY Your body goes through many changes during pregnancy. They vary from person to person. Talk to your caregiver about changes you notice that you are concerned about.  During the second trimester, you will likely have an increase in your appetite. It is normal to have cravings for certain foods. This varies from person to person and pregnancy to pregnancy.   Your lower abdomen will begin to bulge.   You may have to urinate more often because the uterus and baby are pressing on your bladder. It is also common to get more bladder infections during pregnancy (  pain with urination). You can help this by drinking lots of fluids and emptying your bladder before and after intercourse.   You may begin to get stretch marks on your hips, abdomen, and breasts. These are normal changes in the body during pregnancy. There are no exercises or medications to take that prevent this change.   You may begin to develop swollen and bulging veins (varicose veins) in your legs. Wearing support hose, elevating your feet for 15 minutes, 3 to 4 times a day and limiting salt in your diet helps lessen the problem.    Heartburn may develop as the uterus grows and pushes up against the stomach. Antacids recommended by your caregiver helps with this problem. Also, eating smaller meals 4 to 5 times a day helps.   Constipation can be treated with a stool softener or adding bulk to your diet. Drinking lots of fluids, vegetables, fruits, and whole grains are helpful.   Exercising is also helpful. If you have been very active up until your pregnancy, most of these activities can be continued during your pregnancy. If you have been less active, it is helpful to start an exercise program such as walking.   Hemorrhoids (varicose veins in the rectum) may develop at the end of the second trimester. Warm sitz baths and hemorrhoid cream recommended by your caregiver helps hemorrhoid problems.   Backaches may develop during this time of your pregnancy. Avoid heavy lifting, wear low heal shoes and practice good posture to help with backache problems.   Some pregnant women develop tingling and numbness of their hand and fingers because of swelling and tightening of ligaments in the wrist (carpel tunnel syndrome). This goes away after the baby is born.   As your breasts enlarge, you may have to get a bigger bra. Get a comfortable, cotton, support bra. Do not get a nursing bra until the last month of the pregnancy if you will be nursing the baby.   You may get a dark line from your belly button to the pubic area called the linea nigra.   You may develop rosy cheeks because of increase blood flow to the face.   You may develop spider looking lines of the face, neck, arms and chest. These go away after the baby is born.  HOME CARE INSTRUCTIONS   It is extremely important to avoid all smoking, herbs, alcohol, and unprescribed drugs during your pregnancy. These chemicals affect the formation and growth of the baby. Avoid these chemicals throughout the pregnancy to ensure the delivery of a healthy infant.   Most of your home  care instructions are the same as suggested for the first trimester of your pregnancy. Keep your caregiver's appointments. Follow your caregiver's instructions regarding medication use, exercise and diet.   During pregnancy, you are providing food for you and your baby. Continue to eat regular, well-balanced meals. Choose foods such as meat, fish, milk and other low fat dairy products, vegetables, fruits, and whole-grain breads and cereals. Your caregiver will tell you of the ideal weight gain.   A physical sexual relationship may be continued up until near the end of pregnancy if there are no other problems. Problems could include early (premature) leaking of amniotic fluid from the membranes, vaginal bleeding, abdominal pain, or other medical or pregnancy problems.   Exercise regularly if there are no restrictions. Check with your caregiver if you are unsure of the safety of some of your exercises. The greatest weight gain will occur in the   last 2 trimesters of pregnancy. Exercise will help you:   Control your weight.   Get you in shape for labor and delivery.   Lose weight after you have the baby.   Wear a good support or jogging bra for breast tenderness during pregnancy. This may help if worn during sleep. Pads or tissues may be used in the bra if you are leaking colostrum.   Do not use hot tubs, steam rooms or saunas throughout the pregnancy.   Wear your seat belt at all times when driving. This protects you and your baby if you are in an accident.   Avoid raw meat, uncooked cheese, cat litter boxes and soil used by cats. These carry germs that can cause birth defects in the baby.   The second trimester is also a good time to visit your dentist for your dental health if this has not been done yet. Getting your teeth cleaned is OK. Use a soft toothbrush. Brush gently during pregnancy.   It is easier to loose urine during pregnancy. Tightening up and strengthening the pelvic muscles will  help with this problem. Practice stopping your urination while you are going to the bathroom. These are the same muscles you need to strengthen. It is also the muscles you would use as if you were trying to stop from passing gas. You can practice tightening these muscles up 10 times a set and repeating this about 3 times per day. Once you know what muscles to tighten up, do not perform these exercises during urination. It is more likely to contribute to an infection by backing up the urine.   Ask for help if you have financial, counseling or nutritional needs during pregnancy. Your caregiver will be able to offer counseling for these needs as well as refer you for other special needs.   Your skin may become oily. If so, wash your face with mild soap, use non-greasy moisturizer and oil or cream based makeup.  MEDICATIONS AND DRUG USE IN PREGNANCY  Take prenatal vitamins as directed. The vitamin should contain 1 milligram of folic acid. Keep all vitamins out of reach of children. Only a couple vitamins or tablets containing iron may be fatal to a baby or young child when ingested.   Avoid use of all medications, including herbs, over-the-counter medications, not prescribed or suggested by your caregiver. Only take over-the-counter or prescription medicines for pain, discomfort, or fever as directed by your caregiver. Do not use aspirin.   Let your caregiver also know about herbs you may be using.   Alcohol is related to a number of birth defects. This includes fetal alcohol syndrome. All alcohol, in any form, should be avoided completely. Smoking will cause low birth rate and premature babies.   Street or illegal drugs are very harmful to the baby. They are absolutely forbidden. A baby born to an addicted mother will be addicted at birth. The baby will go through the same withdrawal an adult does.  SEEK MEDICAL CARE IF:  You have any concerns or worries during your pregnancy. It is better to call with  your questions if you feel they cannot wait, rather than worry about them. SEEK IMMEDIATE MEDICAL CARE IF:   An unexplained oral temperature above 102 F (38.9 C) develops, or as your caregiver suggests.   You have leaking of fluid from the vagina (birth canal). If leaking membranes are suspected, take your temperature and tell your caregiver of this when you call.   There   is vaginal spotting, bleeding, or passing clots. Tell your caregiver of the amount and how many pads are used. Light spotting in pregnancy is common, especially following intercourse.   You develop a bad smelling vaginal discharge with a change in the color from clear to white.   You continue to feel sick to your stomach (nauseated) and have no relief from remedies suggested. You vomit blood or coffee ground-like materials.   You lose more than 2 pounds of weight or gain more than 2 pounds of weight over 1 week, or as suggested by your caregiver.   You notice swelling of your face, hands, feet, or legs.   You get exposed to German measles and have never had them.   You are exposed to fifth disease or chickenpox.   You develop belly (abdominal) pain. Round ligament discomfort is a common non-cancerous (benign) cause of abdominal pain in pregnancy. Your caregiver still must evaluate you.   You develop a bad headache that does not go away.   You develop fever, diarrhea, pain with urination, or shortness of breath.   You develop visual problems, blurry, or double vision.   You fall or are in a car accident or any kind of trauma.   There is mental or physical violence at home.  Document Released: 04/04/2001 Document Revised: 03/30/2011 Document Reviewed: 10/07/2008 [employer] Patient Information 2012 [employer]. 

## 2011-08-21 NOTE — Progress Notes (Signed)
p-106  Hip and lower back pain

## 2011-09-04 ENCOUNTER — Ambulatory Visit (INDEPENDENT_AMBULATORY_CARE_PROVIDER_SITE_OTHER): Payer: BC Managed Care – PPO | Admitting: Obstetrics and Gynecology

## 2011-09-04 VITALS — BP 130/81 | Temp 98.1°F | Wt 203.0 lb

## 2011-09-04 DIAGNOSIS — O0990 Supervision of high risk pregnancy, unspecified, unspecified trimester: Secondary | ICD-10-CM

## 2011-09-04 NOTE — Patient Instructions (Signed)
Pregnancy - Third Trimester The third trimester of pregnancy (the last 3 months) is a period of the most rapid growth for you and your baby. The baby approaches a length of 20 inches and a weight of 6 to 10 pounds. The baby is adding on fat and getting ready for life outside your body. While inside, babies have periods of sleeping and waking, suck their thumbs, and hiccups. You can often feel small contractions of the uterus. This is false labor. It is also called Braxton-Hicks contractions. This is like a practice for labor. The usual problems in this stage of pregnancy include more difficulty breathing, swelling of the hands and feet from water retention, and having to urinate more often because of the uterus and baby pressing on your bladder.  PRENATAL EXAMS  Blood work may continue to be done during prenatal exams. These tests are done to check on your health and the probable health of your baby. Blood work is used to follow your blood levels (hemoglobin). Anemia (low hemoglobin) is common during pregnancy. Iron and vitamins are given to help prevent this. You may also continue to be checked for diabetes. Some of the past blood tests may be done again.   The size of the uterus is measured during each visit. This makes sure your baby is growing properly according to your pregnancy dates.   Your blood pressure is checked every prenatal visit. This is to make sure you are not getting toxemia.   Your urine is checked every prenatal visit for infection, diabetes and protein.   Your weight is checked at each visit. This is done to make sure gains are happening at the suggested rate and that you and your baby are growing normally.   Sometimes, an ultrasound is performed to confirm the position and the proper growth and development of the baby. This is a test done that bounces harmless sound waves off the baby so your caregiver can more accurately determine due dates.   Discuss the type of pain  medication and anesthesia you will have during your labor and delivery.   Discuss the possibility and anesthesia if a Cesarean Section might be necessary.   Inform your caregiver if there is any mental or physical violence at home.  Sometimes, a specialized non-stress test, contraction stress test and biophysical profile are done to make sure the baby is not having a problem. Checking the amniotic fluid surrounding the baby is called an amniocentesis. The amniotic fluid is removed by sticking a needle into the belly (abdomen). This is sometimes done near the end of pregnancy if an early delivery is required. In this case, it is done to help make sure the baby's lungs are mature enough for the baby to live outside of the womb. If the lungs are not mature and it is unsafe to deliver the baby, an injection of cortisone medication is given to the mother 1 to 2 days before the delivery. This helps the baby's lungs mature and makes it safer to deliver the baby. CHANGES OCCURING IN THE THIRD TRIMESTER OF PREGNANCY Your body goes through many changes during pregnancy. They vary from person to person. Talk to your caregiver about changes you notice and are concerned about.  During the last trimester, you have probably had an increase in your appetite. It is normal to have cravings for certain foods. This varies from person to person and pregnancy to pregnancy.   You may begin to get stretch marks on your hips,   abdomen, and breasts. These are normal changes in the body during pregnancy. There are no exercises or medications to take which prevent this change.   Constipation may be treated with a stool softener or adding bulk to your diet. Drinking lots of fluids, fiber in vegetables, fruits, and whole grains are helpful.   Exercising is also helpful. If you have been very active up until your pregnancy, most of these activities can be continued during your pregnancy. If you have been less active, it is helpful  to start an exercise program such as walking. Consult your caregiver before starting exercise programs.   Avoid all smoking, alcohol, un-prescribed drugs, herbs and "street drugs" during your pregnancy. These chemicals affect the formation and growth of the baby. Avoid chemicals throughout the pregnancy to ensure the delivery of a healthy infant.   Backache, varicose veins and hemorrhoids may develop or get worse.   You will tire more easily in the third trimester, which is normal.   The baby's movements may be stronger and more often.   You may become short of breath easily.   Your belly button may stick out.   A yellow discharge may leak from your breasts called colostrum.   You may have a bloody mucus discharge. This usually occurs a few days to a week before labor begins.  HOME CARE INSTRUCTIONS   Keep your caregiver's appointments. Follow your caregiver's instructions regarding medication use, exercise, and diet.   During pregnancy, you are providing food for you and your baby. Continue to eat regular, well-balanced meals. Choose foods such as meat, fish, milk and other low fat dairy products, vegetables, fruits, and whole-grain breads and cereals. Your caregiver will tell you of the ideal weight gain.   A physical sexual relationship may be continued throughout pregnancy if there are no other problems such as early (premature) leaking of amniotic fluid from the membranes, vaginal bleeding, or belly (abdominal) pain.   Exercise regularly if there are no restrictions. Check with your caregiver if you are unsure of the safety of your exercises. Greater weight gain will occur in the last 2 trimesters of pregnancy. Exercising helps:   Control your weight.   Get you in shape for labor and delivery.   You lose weight after you deliver.   Rest a lot with legs elevated, or as needed for leg cramps or low back pain.   Wear a good support or jogging bra for breast tenderness during  pregnancy. This may help if worn during sleep. Pads or tissues may be used in the bra if you are leaking colostrum.   Do not use hot tubs, steam rooms, or saunas.   Wear your seat belt when driving. This protects you and your baby if you are in an accident.   Avoid raw meat, cat litter boxes and soil used by cats. These carry germs that can cause birth defects in the baby.   It is easier to loose urine during pregnancy. Tightening up and strengthening the pelvic muscles will help with this problem. You can practice stopping your urination while you are going to the bathroom. These are the same muscles you need to strengthen. It is also the muscles you would use if you were trying to stop from passing gas. You can practice tightening these muscles up 10 times a set and repeating this about 3 times per day. Once you know what muscles to tighten up, do not perform these exercises during urination. It is more likely   to cause an infection by backing up the urine.   Ask for help if you have financial, counseling or nutritional needs during pregnancy. Your caregiver will be able to offer counseling for these needs as well as refer you for other special needs.   Make a list of emergency phone numbers and have them available.   Plan on getting help from family or friends when you go home from the hospital.   Make a trial run to the hospital.   Take prenatal classes with the father to understand, practice and ask questions about the labor and delivery.   Prepare the baby's room/nursery.   Do not travel out of the city unless it is absolutely necessary and with the advice of your caregiver.   Wear only low or no heal shoes to have better balance and prevent falling.  MEDICATIONS AND DRUG USE IN PREGNANCY  Take prenatal vitamins as directed. The vitamin should contain 1 milligram of folic acid. Keep all vitamins out of reach of children. Only a couple vitamins or tablets containing iron may be fatal  to a baby or young child when ingested.   Avoid use of all medications, including herbs, over-the-counter medications, not prescribed or suggested by your caregiver. Only take over-the-counter or prescription medicines for pain, discomfort, or fever as directed by your caregiver. Do not use aspirin, ibuprofen (Motrin, Advil, Nuprin) or naproxen (Aleve) unless OK'd by your caregiver.   Let your caregiver also know about herbs you may be using.   Alcohol is related to a number of birth defects. This includes fetal alcohol syndrome. All alcohol, in any form, should be avoided completely. Smoking will cause low birth rate and premature babies.   Street/illegal drugs are very harmful to the baby. They are absolutely forbidden. A baby born to an addicted mother will be addicted at birth. The baby will go through the same withdrawal an adult does.  SEEK MEDICAL CARE IF: You have any concerns or worries during your pregnancy. It is better to call with your questions if you feel they cannot wait, rather than worry about them. DECISIONS ABOUT CIRCUMCISION You may or may not know the sex of your baby. If you know your baby is a boy, it may be time to think about circumcision. Circumcision is the removal of the foreskin of the penis. This is the skin that covers the sensitive end of the penis. There is no proven medical need for this. Often this decision is made on what is popular at the time or based upon religious beliefs and social issues. You can discuss these issues with your caregiver or pediatrician. SEEK IMMEDIATE MEDICAL CARE IF:   An unexplained oral temperature above 102 F (38.9 C) develops, or as your caregiver suggests.   You have leaking of fluid from the vagina (birth canal). If leaking membranes are suspected, take your temperature and tell your caregiver of this when you call.   There is vaginal spotting, bleeding or passing clots. Tell your caregiver of the amount and how many pads are  used.   You develop a bad smelling vaginal discharge with a change in the color from clear to white.   You develop vomiting that lasts more than 24 hours.   You develop chills or fever.   You develop shortness of breath.   You develop burning on urination.   You loose more than 2 pounds of weight or gain more than 2 pounds of weight or as suggested by your   caregiver.   You notice sudden swelling of your face, hands, and feet or legs.   You develop belly (abdominal) pain. Round ligament discomfort is a common non-cancerous (benign) cause of abdominal pain in pregnancy. Your caregiver still must evaluate you.   You develop a severe headache that does not go away.   You develop visual problems, blurred or double vision.   If you have not felt your baby move for more than 1 hour. If you think the baby is not moving as much as usual, eat something with sugar in it and lie down on your left side for an hour. The baby should move at least 4 to 5 times per hour. Call right away if your baby moves less than that.   You fall, are in a car accident or any kind of trauma.   There is mental or physical violence at home.  Document Released: 04/04/2001 Document Revised: 03/30/2011 Document Reviewed: 10/07/2008 [employer] Patient Information 2012 [employer]. 

## 2011-09-04 NOTE — Progress Notes (Signed)
p-94  Dizziness and light sensitive

## 2011-09-04 NOTE — Progress Notes (Signed)
Husband has had vasectomy. Still working FT. Back pain better. Rec: small frequent meals and stay hydrated.

## 2011-09-15 ENCOUNTER — Ambulatory Visit (INDEPENDENT_AMBULATORY_CARE_PROVIDER_SITE_OTHER): Payer: BC Managed Care – PPO | Admitting: Advanced Practice Midwife

## 2011-09-15 DIAGNOSIS — O0990 Supervision of high risk pregnancy, unspecified, unspecified trimester: Secondary | ICD-10-CM

## 2011-09-15 NOTE — Patient Instructions (Signed)
Fetal Movement Counts Patient Name: __________________________________________________ Patient Due Date: ____________________ Kick counts is highly recommended in high risk pregnancies, but it is a good idea for every pregnant woman to do. Start counting fetal movements at 28 weeks of the pregnancy. Fetal movements increase after eating a full meal or eating or drinking something sweet (the blood sugar is higher). It is also important to drink plenty of fluids (well hydrated) before doing the count. Lie on your left side because it helps with the circulation or you can sit in a comfortable chair with your arms over your belly (abdomen) with no distractions around you. DOING THE COUNT  Try to do the count the same time of day each time you do it.   Mark the day and time, then see how long it takes for you to feel 10 movements (kicks, flutters, swishes, rolls). You should have at least 10 movements within 2 hours. You will most likely feel 10 movements in much less than 2 hours. If you do not, wait an hour and count again. After a couple of days you will see a pattern.   What you are looking for is a change in the pattern or not enough counts in 2 hours. Is it taking longer in time to reach 10 movements?  SEEK MEDICAL CARE IF:  You feel less than 10 counts in 2 hours. Tried twice.   No movement in one hour.   The pattern is changing or taking longer each day to reach 10 counts in 2 hours.   You feel the baby is not moving as it usually does.  Date: ____________ Movements: ____________ Start time: ____________ Finish time: ____________  Date: ____________ Movements: ____________ Start time: ____________ Finish time: ____________ Date: ____________ Movements: ____________ Start time: ____________ Finish time: ____________ Date: ____________ Movements: ____________ Start time: ____________ Finish time: ____________ Date: ____________ Movements: ____________ Start time: ____________ Finish time:  ____________ Date: ____________ Movements: ____________ Start time: ____________ Finish time: ____________ Date: ____________ Movements: ____________ Start time: ____________ Finish time: ____________ Date: ____________ Movements: ____________ Start time: ____________ Finish time: ____________  Date: ____________ Movements: ____________ Start time: ____________ Finish time: ____________ Date: ____________ Movements: ____________ Start time: ____________ Finish time: ____________ Date: ____________ Movements: ____________ Start time: ____________ Finish time: ____________ Date: ____________ Movements: ____________ Start time: ____________ Finish time: ____________ Date: ____________ Movements: ____________ Start time: ____________ Finish time: ____________ Date: ____________ Movements: ____________ Start time: ____________ Finish time: ____________ Date: ____________ Movements: ____________ Start time: ____________ Finish time: ____________  Date: ____________ Movements: ____________ Start time: ____________ Finish time: ____________ Date: ____________ Movements: ____________ Start time: ____________ Finish time: ____________ Date: ____________ Movements: ____________ Start time: ____________ Finish time: ____________ Date: ____________ Movements: ____________ Start time: ____________ Finish time: ____________ Date: ____________ Movements: ____________ Start time: ____________ Finish time: ____________ Date: ____________ Movements: ____________ Start time: ____________ Finish time: ____________ Date: ____________ Movements: ____________ Start time: ____________ Finish time: ____________  Date: ____________ Movements: ____________ Start time: ____________ Finish time: ____________ Date: ____________ Movements: ____________ Start time: ____________ Finish time: ____________ Date: ____________ Movements: ____________ Start time: ____________ Finish time: ____________ Date: ____________ Movements:  ____________ Start time: ____________ Finish time: ____________ Date: ____________ Movements: ____________ Start time: ____________ Finish time: ____________ Date: ____________ Movements: ____________ Start time: ____________ Finish time: ____________ Date: ____________ Movements: ____________ Start time: ____________ Finish time: ____________  Date: ____________ Movements: ____________ Start time: ____________ Finish time: ____________ Date: ____________ Movements: ____________ Start time: ____________ Finish time: ____________ Date: ____________ Movements: ____________ Start time:   ____________ Finish time: ____________ Date: ____________ Movements: ____________ Start time: ____________ Finish time: ____________ Date: ____________ Movements: ____________ Start time: ____________ Finish time: ____________ Date: ____________ Movements: ____________ Start time: ____________ Finish time: ____________ Date: ____________ Movements: ____________ Start time: ____________ Finish time: ____________  Date: ____________ Movements: ____________ Start time: ____________ Finish time: ____________ Date: ____________ Movements: ____________ Start time: ____________ Finish time: ____________ Date: ____________ Movements: ____________ Start time: ____________ Finish time: ____________ Date: ____________ Movements: ____________ Start time: ____________ Finish time: ____________ Date: ____________ Movements: ____________ Start time: ____________ Finish time: ____________ Date: ____________ Movements: ____________ Start time: ____________ Finish time: ____________ Date: ____________ Movements: ____________ Start time: ____________ Finish time: ____________  Date: ____________ Movements: ____________ Start time: ____________ Finish time: ____________ Date: ____________ Movements: ____________ Start time: ____________ Finish time: ____________ Date: ____________ Movements: ____________ Start time: ____________ Finish  time: ____________ Date: ____________ Movements: ____________ Start time: ____________ Finish time: ____________ Date: ____________ Movements: ____________ Start time: ____________ Finish time: ____________ Date: ____________ Movements: ____________ Start time: ____________ Finish time: ____________ Date: ____________ Movements: ____________ Start time: ____________ Finish time: ____________  Date: ____________ Movements: ____________ Start time: ____________ Finish time: ____________ Date: ____________ Movements: ____________ Start time: ____________ Finish time: ____________ Date: ____________ Movements: ____________ Start time: ____________ Finish time: ____________ Date: ____________ Movements: ____________ Start time: ____________ Finish time: ____________ Date: ____________ Movements: ____________ Start time: ____________ Finish time: ____________ Date: ____________ Movements: ____________ Start time: ____________ Finish time: ____________ Document Released: 05/10/2006 Document Revised: 03/30/2011 Document Reviewed: 11/10/2008 [employer] Patient Information 2012 [employer].   Pregnancy - Third Trimester The third trimester of pregnancy (the last 3 months) is a period of the most rapid growth for you and your baby. The baby approaches a length of 20 inches and a weight of 6 to 10 pounds. The baby is adding on fat and getting ready for life outside your body. While inside, babies have periods of sleeping and waking, suck their thumbs, and hiccups. You can often feel small contractions of the uterus. This is false labor. It is also called Braxton-Hicks contractions. This is like a practice for labor. The usual problems in this stage of pregnancy include more difficulty breathing, swelling of the hands and feet from water retention, and having to urinate more often because of the uterus and baby pressing on your bladder.  PRENATAL EXAMS  Blood work may continue to be done during prenatal  exams. These tests are done to check on your health and the probable health of your baby. Blood work is used to follow your blood levels (hemoglobin). Anemia (low hemoglobin) is common during pregnancy. Iron and vitamins are given to help prevent this. You may also continue to be checked for diabetes. Some of the past blood tests may be done again.   The size of the uterus is measured during each visit. This makes sure your baby is growing properly according to your pregnancy dates.   Your blood pressure is checked every prenatal visit. This is to make sure you are not getting toxemia.   Your urine is checked every prenatal visit for infection, diabetes and protein.   Your weight is checked at each visit. This is done to make sure gains are happening at the suggested rate and that you and your baby are growing normally.   Sometimes, an ultrasound is performed to confirm the position and the proper growth and development of the baby. This is a test done that bounces harmless sound waves off the baby so your caregiver   determine due dates.   Discuss the type of pain medication and anesthesia you will have during your labor and delivery.   Discuss the possibility and anesthesia if a Cesarean Section might be necessary.   Inform your caregiver if there is any mental or physical violence at home.  Sometimes, a specialized non-stress test, contraction stress test and biophysical profile are done to make sure the baby is not having a problem. Checking the amniotic fluid surrounding the baby is called an amniocentesis. The amniotic fluid is removed by sticking a needle into the belly (abdomen). This is sometimes done near the end of pregnancy if an early delivery is required. In this case, it is done to help make sure the baby's lungs are mature enough for the baby to live outside of the womb. If the lungs are not mature and it is unsafe to deliver the baby, an injection of cortisone medication  is given to the mother 1 to 2 days before the delivery. This helps the baby's lungs mature and makes it safer to deliver the baby. CHANGES OCCURING IN THE THIRD TRIMESTER OF PREGNANCY Your body goes through many changes during pregnancy. They vary from person to person. Talk to your caregiver about changes you notice and are concerned about.  During the last trimester, you have probably had an increase in your appetite. It is normal to have cravings for certain foods. This varies from person to person and pregnancy to pregnancy.   You may begin to get stretch marks on your hips, abdomen, and breasts. These are normal changes in the body during pregnancy. There are no exercises or medications to take which prevent this change.   Constipation may be treated with a stool softener or adding bulk to your diet. Drinking lots of fluids, fiber in vegetables, fruits, and whole grains are helpful.   Exercising is also helpful. If you have been very active up until your pregnancy, most of these activities can be continued during your pregnancy. If you have been less active, it is helpful to start an exercise program such as walking. Consult your caregiver before starting exercise programs.   Avoid all smoking, alcohol, un-prescribed drugs, herbs and "street drugs" during your pregnancy. These chemicals affect the formation and growth of the baby. Avoid chemicals throughout the pregnancy to ensure the delivery of a healthy infant.   Backache, varicose veins and hemorrhoids may develop or get worse.   You will tire more easily in the third trimester, which is normal.   The baby's movements may be stronger and more often.   You may become short of breath easily.   Your belly button may stick out.   A yellow discharge may leak from your breasts called colostrum.   You may have a bloody mucus discharge. This usually occurs a few days to a week before labor begins.  HOME CARE INSTRUCTIONS   Keep your  caregiver's appointments. Follow your caregiver's instructions regarding medication use, exercise, and diet.   During pregnancy, you are providing food for you and your baby. Continue to eat regular, well-balanced meals. Choose foods such as meat, fish, milk and other low fat dairy products, vegetables, fruits, and whole-grain breads and cereals. Your caregiver will tell you of the ideal weight gain.   A physical sexual relationship may be continued throughout pregnancy if there are no other problems such as early (premature) leaking of amniotic fluid from the membranes, vaginal bleeding, or belly (abdominal) pain.   Exercise regularly if  there are no restrictions. Check with your caregiver if you are unsure of the safety of your exercises. Greater weight gain will occur in the last 2 trimesters of pregnancy. Exercising helps:   Control your weight.   Get you in shape for labor and delivery.   You lose weight after you deliver.   Rest a lot with legs elevated, or as needed for leg cramps or low back pain.   Wear a good support or jogging bra for breast tenderness during pregnancy. This may help if worn during sleep. Pads or tissues may be used in the bra if you are leaking colostrum.   Do not use hot tubs, steam rooms, or saunas.   Wear your seat belt when driving. This protects you and your baby if you are in an accident.   Avoid raw meat, cat litter boxes and soil used by cats. These carry germs that can cause birth defects in the baby.   It is easier to loose urine during pregnancy. Tightening up and strengthening the pelvic muscles will help with this problem. You can practice stopping your urination while you are going to the bathroom. These are the same muscles you need to strengthen. It is also the muscles you would use if you were trying to stop from passing gas. You can practice tightening these muscles up 10 times a set and repeating this about 3 times per day. Once you know what  muscles to tighten up, do not perform these exercises during urination. It is more likely to cause an infection by backing up the urine.   Ask for help if you have financial, counseling or nutritional needs during pregnancy. Your caregiver will be able to offer counseling for these needs as well as refer you for other special needs.   Make a list of emergency phone numbers and have them available.   Plan on getting help from family or friends when you go home from the hospital.   Make a trial run to the hospital.   Take prenatal classes with the father to understand, practice and ask questions about the labor and delivery.   Prepare the baby's room/nursery.   Do not travel out of the city unless it is absolutely necessary and with the advice of your caregiver.   Wear only low or no heal shoes to have better balance and prevent falling.  MEDICATIONS AND DRUG USE IN PREGNANCY  Take prenatal vitamins as directed. The vitamin should contain 1 milligram of folic acid. Keep all vitamins out of reach of children. Only a couple vitamins or tablets containing iron may be fatal to a baby or young child when ingested.   Avoid use of all medications, including herbs, over-the-counter medications, not prescribed or suggested by your caregiver. Only take over-the-counter or prescription medicines for pain, discomfort, or fever as directed by your caregiver. Do not use aspirin, ibuprofen (Motrin, Advil, Nuprin) or naproxen (Aleve) unless OK'd by your caregiver.   Let your caregiver also know about herbs you may be using.   Alcohol is related to a number of birth defects. This includes fetal alcohol syndrome. All alcohol, in any form, should be avoided completely. Smoking will cause low birth rate and premature babies.   Street/illegal drugs are very harmful to the baby. They are absolutely forbidden. A baby born to an addicted mother will be addicted at birth. The baby will go through the same  withdrawal an adult does.  SEEK MEDICAL CARE IF: You have any  concerns or worries during your pregnancy. It is better to call with your questions if you feel they cannot wait, rather than worry about them. DECISIONS ABOUT CIRCUMCISION You may or may not know the sex of your baby. If you know your baby is a boy, it may be time to think about circumcision. Circumcision is the removal of the foreskin of the penis. This is the skin that covers the sensitive end of the penis. There is no proven medical need for this. Often this decision is made on what is popular at the time or based upon religious beliefs and social issues. You can discuss these issues with your caregiver or pediatrician. SEEK IMMEDIATE MEDICAL CARE IF:   An unexplained oral temperature above 102 F (38.9 C) develops, or as your caregiver suggests.   You have leaking of fluid from the vagina (birth canal). If leaking membranes are suspected, take your temperature and tell your caregiver of this when you call.   There is vaginal spotting, bleeding or passing clots. Tell your caregiver of the amount and how many pads are used.   You develop a bad smelling vaginal discharge with a change in the color from clear to white.   You develop vomiting that lasts more than 24 hours.   You develop chills or fever.   You develop shortness of breath.   You develop burning on urination.   You loose more than 2 pounds of weight or gain more than 2 pounds of weight or as suggested by your caregiver.   You notice sudden swelling of your face, hands, and feet or legs.   You develop belly (abdominal) pain. Round ligament discomfort is a common non-cancerous (benign) cause of abdominal pain in pregnancy. Your caregiver still must evaluate you.   You develop a severe headache that does not go away.   You develop visual problems, blurred or double vision.   If you have not felt your baby move for more than 1 hour. If you think the baby is not  moving as much as usual, eat something with sugar in it and lie down on your left side for an hour. The baby should move at least 4 to 5 times per hour. Call right away if your baby moves less than that.   You fall, are in a car accident or any kind of trauma.   There is mental or physical violence at home.  Document Released: 04/04/2001 Document Revised: 03/30/2011 Document Reviewed: 10/07/2008 [facility] Patient Information 2012 [place], [place].Heartburn During Pregnancy  Heartburn is a burning sensation in the chest caused by stomach acid backing up into the esophagus. Heartburn (also known as "reflux") is common in pregnancy because a certain hormone (progesterone) changes. The progesterone hormone may relax the valve that separates the esophagus from the stomach. This allows acid to go up into the esophagus, causing heartburn. Heartburn may also happen in pregnancy because the enlarging uterus pushes up on the stomach, which pushes more acid into the esophagus. This is especially true in the later stages of pregnancy. Heartburn problems usually go away after giving birth. CAUSES   The progesterone hormone.   Changing hormone levels.   The growing uterus that pushes stomach acid upward.   Large meals.   Certain foods and drinks.   Exercise.   Increased acid production.  SYMPTOMS   Burning pain in the chest or lower throat.   Bitter taste in the mouth.   Coughing.  DIAGNOSIS  Heartburn is typically diagnosed  by your caregiver when taking a careful history of your concern. Your caregiver may order a blood test to check for a certain type of bacteria that is associated with heartburn. Sometimes, heartburn is diagnosed by prescribing a heartburn medicine to see if the symptoms improve. It is rare in pregnancy to have a procedure called an endoscopy. This is when a tube with a light and a camera on the end is used to examine the esophagus and the stomach. TREATMENT   Your caregiver may  tell you to use certain over-the-counter medicines (antacids, acid reducers) for mild heartburn.   Your caregiver may prescribe medicines to decrease stomach acid or to protect your stomach lining.   Your caregiver may recommend certain diet changes.   For severe cases, your caregiver may recommend that the head of the bed be elevated on blocks. (Sleeping with more pillows is not an effective treatment as it only changes the position of your head and does not improve the main problem of stomach acid refluxing into the esophagus.)  HOME CARE INSTRUCTIONS   Take all medicines as directed by your caregiver.   Raise the head of your bed by putting blocks under the legs if instructed to by your caregiver.   Do not exercise right after eating.   Avoid eating 2 or 3 hours before bed. Do not lie down right after eating.   Eat small meals throughout the day instead of 3 large meals.   Identify foods and beverages that make your symptoms worse and avoid them. Foods you may want to avoid include:   Peppers.   Chocolate.   High-fat foods, including fried foods.   Spicy foods.   Garlic and onions.   Citrus fruits, including oranges, grapefruit, lemons, and limes.   Food containing tomatoes or tomato products.   Mint.   Carbonated and caffeinated drinks.   Vinegar.  SEEK IMMEDIATE MEDICAL CARE IF:   You have severe chest pain that goes down your arm or into your jaw or neck.   You feel sweaty, dizzy, or lightheaded.   You become short of breath.   You vomit blood.   You have difficulty or pain with swallowing.   You have bloody or black, tarry stools.   You have episodes of heartburn more than 3 times a week, for more than 2 weeks.  MAKE SURE YOU:  Understand these instructions.   Will watch your condition.   Will get help right away if you are not doing well or get worse.  Document Released: 04/07/2000 Document Revised: 03/30/2011 Document Reviewed:  09/29/2010 [facility] Patient Information 2012 [place], [place].

## 2011-09-15 NOTE — Progress Notes (Signed)
Reflux and seasonal allergies still a problem. Poor appetite due to reflux, excess mucus. Taking pepcid, Robitussin, various allergy meds. Rec Prilosec, small frequent meals and drinks, food diary. Has seen ENT. R C/S scheduled 10/30/11 per pt.

## 2011-09-15 NOTE — Progress Notes (Signed)
p-112 

## 2011-09-29 ENCOUNTER — Ambulatory Visit (INDEPENDENT_AMBULATORY_CARE_PROVIDER_SITE_OTHER): Payer: BC Managed Care – PPO | Admitting: Family

## 2011-09-29 VITALS — BP 124/79 | Temp 98.3°F | Wt 206.0 lb

## 2011-09-29 DIAGNOSIS — O0990 Supervision of high risk pregnancy, unspecified, unspecified trimester: Secondary | ICD-10-CM

## 2011-09-29 DIAGNOSIS — Z98891 History of uterine scar from previous surgery: Secondary | ICD-10-CM

## 2011-09-29 DIAGNOSIS — Z9889 Other specified postprocedural states: Secondary | ICD-10-CM

## 2011-09-29 DIAGNOSIS — O26899 Other specified pregnancy related conditions, unspecified trimester: Secondary | ICD-10-CM

## 2011-09-29 DIAGNOSIS — N898 Other specified noninflammatory disorders of vagina: Secondary | ICD-10-CM

## 2011-09-29 NOTE — Progress Notes (Signed)
Pt reports white/gray discharge x 3 days, +odor; exam - white, creamy discharge, slight odor; declined cervical exam; wet prep collected

## 2011-09-29 NOTE — Progress Notes (Signed)
p-119  Thinks she may have BV

## 2011-09-30 LAB — WET PREP, GENITAL
Clue Cells Wet Prep HPF POC: NONE SEEN
Trich, Wet Prep: NONE SEEN

## 2011-10-10 ENCOUNTER — Ambulatory Visit: Payer: BC Managed Care – PPO | Admitting: Obstetrics & Gynecology

## 2011-10-10 NOTE — Progress Notes (Signed)
p=106 

## 2011-10-10 NOTE — Progress Notes (Signed)
Pts daughter diagnosed with coxsackie virus.  Pt has cough and mild sore throat.  No fevers.  No lesions/ulcers.  Pt on allegra for allergies. Chest--CTA B, Throat--mildly erythematous, evidence of post nasal drip; no lymphadenopathy.  Robutusin for cough.  Need op note from [facility] for myomectomy and c/s.

## 2011-10-11 ENCOUNTER — Telehealth: Payer: Self-pay | Admitting: Obstetrics & Gynecology

## 2011-10-11 NOTE — Telephone Encounter (Signed)
Op note from [facility].  Myomectomy included incision across fundus.  Pt needs early delivery at 37 weeks.  Discussed with D.r. Whitecar (MFM).  Pt aware.  [name] will schedule on June 24th with MD covering L & D and assist.

## 2011-10-11 NOTE — Telephone Encounter (Signed)
Scheduled for 10/16/11 @ 2:30p w/ Dr. Jolayne Panther for RLTCS.

## 2011-10-13 ENCOUNTER — Other Ambulatory Visit (HOSPITAL_COMMUNITY): Payer: BC Managed Care – PPO

## 2011-10-13 ENCOUNTER — Inpatient Hospital Stay (HOSPITAL_COMMUNITY): Admission: RE | Admit: 2011-10-13 | Payer: BC Managed Care – PPO | Source: Ambulatory Visit

## 2011-10-13 ENCOUNTER — Encounter (HOSPITAL_COMMUNITY): Payer: Self-pay | Admitting: Pharmacist

## 2011-10-16 ENCOUNTER — Encounter (HOSPITAL_COMMUNITY): Payer: Self-pay | Admitting: *Deleted

## 2011-10-16 ENCOUNTER — Encounter (HOSPITAL_COMMUNITY): Payer: Self-pay

## 2011-10-16 ENCOUNTER — Inpatient Hospital Stay (HOSPITAL_COMMUNITY)
Admission: RE | Admit: 2011-10-16 | Discharge: 2011-10-19 | DRG: 371 | Disposition: A | Discharge status: Home / Self Care | Payer: BC Managed Care – PPO | Source: Ambulatory Visit | Attending: Obstetrics and Gynecology | Admitting: Obstetrics and Gynecology

## 2011-10-16 ENCOUNTER — Inpatient Hospital Stay (HOSPITAL_COMMUNITY): Payer: BC Managed Care – PPO

## 2011-10-16 ENCOUNTER — Encounter (HOSPITAL_COMMUNITY): Payer: Self-pay | Admitting: Anesthesiology

## 2011-10-16 ENCOUNTER — Encounter (HOSPITAL_COMMUNITY)
Admission: RE | Disposition: A | Discharge status: Home / Self Care | Payer: Self-pay | Source: Ambulatory Visit | Attending: Obstetrics and Gynecology

## 2011-10-16 DIAGNOSIS — O0990 Supervision of high risk pregnancy, unspecified, unspecified trimester: Secondary | ICD-10-CM

## 2011-10-16 DIAGNOSIS — D259 Leiomyoma of uterus, unspecified: Secondary | ICD-10-CM

## 2011-10-16 DIAGNOSIS — O34219 Maternal care for unspecified type scar from previous cesarean delivery: Principal | ICD-10-CM | POA: Diagnosis present

## 2011-10-16 LAB — CBC
MCV: 91.4 fL (ref 78.0–100.0)
Platelets: 263 10*3/uL (ref 150–400)
RBC: 4.18 MIL/uL (ref 3.87–5.11)
RDW: 14.1 % (ref 11.5–15.5)
WBC: 6.1 10*3/uL (ref 4.0–10.5)

## 2011-10-16 LAB — TYPE AND SCREEN
ABO/RH(D): O POS
Antibody Screen: NEGATIVE

## 2011-10-16 LAB — RPR: RPR Ser Ql: NONREACTIVE

## 2011-10-16 LAB — SURGICAL PCR SCREEN
MRSA, PCR: NEGATIVE
Staphylococcus aureus: NEGATIVE

## 2011-10-16 SURGERY — Surgical Case
Anesthesia: Epidural | Site: Abdomen | Wound class: Clean Contaminated

## 2011-10-16 MED ORDER — PHENYLEPHRINE 40 MCG/ML (10ML) SYRINGE FOR IV PUSH (FOR BLOOD PRESSURE SUPPORT)
PREFILLED_SYRINGE | INTRAVENOUS | Status: AC
  Filled 2011-10-16: qty 5

## 2011-10-16 MED ORDER — NALBUPHINE HCL 10 MG/ML IJ SOLN
5.0000 mg | INTRAMUSCULAR | Status: DC | PRN
  Administered 2011-10-17: 10 mg via SUBCUTANEOUS
  Filled 2011-10-16: qty 1

## 2011-10-16 MED ORDER — LACTATED RINGERS IV SOLN
INTRAVENOUS | Status: DC
Start: 2011-10-16 — End: 2011-10-19
  Administered 2011-10-17: 03:00:00 via INTRAVENOUS

## 2011-10-16 MED ORDER — LACTATED RINGERS IV SOLN
INTRAVENOUS | Status: DC
  Administered 2011-10-16 (×3): via INTRAVENOUS

## 2011-10-16 MED ORDER — PRENATAL MULTIVITAMIN CH
1.0000 | ORAL_TABLET | Freq: Every day | ORAL | Status: DC
  Administered 2011-10-17 – 2011-10-19 (×3): 1 via ORAL
  Filled 2011-10-16 (×3): qty 1

## 2011-10-16 MED ORDER — DIPHENHYDRAMINE HCL 50 MG/ML IJ SOLN: 12.5000 mg | INTRAMUSCULAR | Status: DC | PRN

## 2011-10-16 MED ORDER — WITCH HAZEL-GLYCERIN EX PADS: 1.0000 "application " | MEDICATED_PAD | CUTANEOUS | Status: DC | PRN

## 2011-10-16 MED ORDER — METOCLOPRAMIDE HCL 5 MG/ML IJ SOLN: 10.0000 mg | Freq: Three times a day (TID) | INTRAMUSCULAR | Status: DC | PRN

## 2011-10-16 MED ORDER — CEFAZOLIN SODIUM 1-5 GM-% IV SOLN
INTRAVENOUS | Status: AC
  Administered 2011-10-16: 2 g via INTRAVENOUS
  Filled 2011-10-16: qty 100

## 2011-10-16 MED ORDER — SIMETHICONE 80 MG PO CHEW
80.0000 mg | CHEWABLE_TABLET | Freq: Three times a day (TID) | ORAL | Status: DC
  Administered 2011-10-16 – 2011-10-19 (×10): 80 mg via ORAL

## 2011-10-16 MED ORDER — MENTHOL 3 MG MT LOZG: 1.0000 | LOZENGE | OROMUCOSAL | Status: DC | PRN

## 2011-10-16 MED ORDER — LEVOCETIRIZINE DIHYDROCHLORIDE 5 MG PO TABS: 5.0000 mg | ORAL_TABLET | Freq: Every evening | ORAL | Status: DC

## 2011-10-16 MED ORDER — DIPHENHYDRAMINE HCL 25 MG PO CAPS
25.0000 mg | ORAL_CAPSULE | Freq: Four times a day (QID) | ORAL | Status: DC | PRN
  Filled 2011-10-16: qty 1

## 2011-10-16 MED ORDER — ONDANSETRON HCL 4 MG/2ML IJ SOLN: 4.0000 mg | INTRAMUSCULAR | Status: DC | PRN

## 2011-10-16 MED ORDER — FENTANYL CITRATE 0.05 MG/ML IJ SOLN
INTRAMUSCULAR | Status: AC
  Filled 2011-10-16: qty 2

## 2011-10-16 MED ORDER — PHENYLEPHRINE HCL 10 MG/ML IJ SOLN
INTRAMUSCULAR | Status: DC | PRN
  Administered 2011-10-16 (×2): 80 ug via INTRAVENOUS
  Administered 2011-10-16: 40 ug via INTRAVENOUS
  Administered 2011-10-16 (×2): 80 ug via INTRAVENOUS
  Administered 2011-10-16: 40 ug via INTRAVENOUS
  Administered 2011-10-16: 80 ug via INTRAVENOUS
  Administered 2011-10-16: 40 ug via INTRAVENOUS
  Administered 2011-10-16: 80 ug via INTRAVENOUS

## 2011-10-16 MED ORDER — KETOROLAC TROMETHAMINE 30 MG/ML IJ SOLN
30.0000 mg | Freq: Four times a day (QID) | INTRAMUSCULAR | Status: AC | PRN
  Administered 2011-10-16: 30 mg via INTRAMUSCULAR
  Filled 2011-10-16: qty 1

## 2011-10-16 MED ORDER — ONDANSETRON HCL 4 MG/2ML IJ SOLN
INTRAMUSCULAR | Status: AC
  Filled 2011-10-16: qty 2

## 2011-10-16 MED ORDER — NALBUPHINE HCL 10 MG/ML IJ SOLN
5.0000 mg | INTRAMUSCULAR | Status: DC | PRN
  Administered 2011-10-17: 5 mg via INTRAVENOUS
  Filled 2011-10-16: qty 1

## 2011-10-16 MED ORDER — CEFAZOLIN SODIUM-DEXTROSE 2-3 GM-% IV SOLR: 2.0000 g | INTRAVENOUS | Status: DC

## 2011-10-16 MED ORDER — FENTANYL CITRATE 0.05 MG/ML IJ SOLN
INTRAMUSCULAR | Status: DC | PRN
  Administered 2011-10-16: 25 ug via INTRATHECAL

## 2011-10-16 MED ORDER — SCOPOLAMINE 1 MG/3DAYS TD PT72
MEDICATED_PATCH | TRANSDERMAL | Status: AC
  Administered 2011-10-16: 1.5 mg via TRANSDERMAL
  Filled 2011-10-16: qty 1

## 2011-10-16 MED ORDER — ONDANSETRON HCL 4 MG/2ML IJ SOLN
INTRAMUSCULAR | Status: DC | PRN
  Administered 2011-10-16: 4 mg via INTRAVENOUS

## 2011-10-16 MED ORDER — ACETAMINOPHEN 325 MG PO TABS: 325.0000 mg | ORAL_TABLET | ORAL | Status: DC | PRN

## 2011-10-16 MED ORDER — MUPIROCIN 2 % EX OINT
TOPICAL_OINTMENT | Freq: Two times a day (BID) | CUTANEOUS | Status: DC
  Administered 2011-10-16: 1 via NASAL

## 2011-10-16 MED ORDER — DIPHENHYDRAMINE HCL 25 MG PO CAPS
25.0000 mg | ORAL_CAPSULE | ORAL | Status: DC | PRN
  Administered 2011-10-17: 25 mg via ORAL

## 2011-10-16 MED ORDER — OXYTOCIN 10 UNIT/ML IJ SOLN
INTRAMUSCULAR | Status: AC
  Filled 2011-10-16: qty 4

## 2011-10-16 MED ORDER — MORPHINE SULFATE (PF) 0.5 MG/ML IJ SOLN
INTRAMUSCULAR | Status: DC | PRN
  Administered 2011-10-16: .1 mg via INTRATHECAL

## 2011-10-16 MED ORDER — KETOROLAC TROMETHAMINE 30 MG/ML IJ SOLN
INTRAMUSCULAR | Status: AC
  Administered 2011-10-16: 30 mg via INTRAMUSCULAR
  Filled 2011-10-16: qty 1

## 2011-10-16 MED ORDER — KETOROLAC TROMETHAMINE 30 MG/ML IJ SOLN
30.0000 mg | Freq: Four times a day (QID) | INTRAMUSCULAR | Status: AC | PRN
  Administered 2011-10-16: 30 mg via INTRAVENOUS

## 2011-10-16 MED ORDER — SENNOSIDES-DOCUSATE SODIUM 8.6-50 MG PO TABS
2.0000 | ORAL_TABLET | Freq: Every day | ORAL | Status: DC
Start: 2011-10-16 — End: 2011-10-19
  Administered 2011-10-16 – 2011-10-17 (×2): 2 via ORAL

## 2011-10-16 MED ORDER — DIPHENHYDRAMINE HCL 50 MG/ML IJ SOLN: 25.0000 mg | INTRAMUSCULAR | Status: DC | PRN

## 2011-10-16 MED ORDER — TETANUS-DIPHTH-ACELL PERTUSSIS 5-2.5-18.5 LF-MCG/0.5 IM SUSP: 0.5000 mL | Freq: Once | INTRAMUSCULAR | Status: DC

## 2011-10-16 MED ORDER — SODIUM CHLORIDE 0.9 % IJ SOLN: 3.0000 mL | INTRAMUSCULAR | Status: DC | PRN

## 2011-10-16 MED ORDER — ONDANSETRON HCL 4 MG/2ML IJ SOLN: 4.0000 mg | Freq: Three times a day (TID) | INTRAMUSCULAR | Status: DC | PRN

## 2011-10-16 MED ORDER — OXYTOCIN 40 UNITS IN LACTATED RINGERS INFUSION - SIMPLE MED: 62.5000 mL/h | INTRAVENOUS | Status: AC

## 2011-10-16 MED ORDER — MEPERIDINE HCL 25 MG/ML IJ SOLN: 6.2500 mg | INTRAMUSCULAR | Status: DC | PRN

## 2011-10-16 MED ORDER — MORPHINE SULFATE 0.5 MG/ML IJ SOLN
INTRAMUSCULAR | Status: AC
  Filled 2011-10-16: qty 10

## 2011-10-16 MED ORDER — MUPIROCIN 2 % EX OINT
TOPICAL_OINTMENT | CUTANEOUS | Status: AC
  Administered 2011-10-16: 1 via NASAL
  Filled 2011-10-16: qty 22

## 2011-10-16 MED ORDER — ACETAMINOPHEN 10 MG/ML IV SOLN: 1000.0000 mg | Freq: Four times a day (QID) | INTRAVENOUS | Status: AC | PRN

## 2011-10-16 MED ORDER — MIDAZOLAM HCL 2 MG/2ML IJ SOLN: 0.5000 mg | Freq: Once | INTRAMUSCULAR | Status: DC | PRN

## 2011-10-16 MED ORDER — BUPIVACAINE IN DEXTROSE 0.75-8.25 % IT SOLN
INTRATHECAL | Status: DC | PRN
  Administered 2011-10-16: 1.6 mL via INTRATHECAL

## 2011-10-16 MED ORDER — SCOPOLAMINE 1 MG/3DAYS TD PT72: 1.0000 | MEDICATED_PATCH | Freq: Once | TRANSDERMAL | Status: DC

## 2011-10-16 MED ORDER — IBUPROFEN 600 MG PO TABS
600.0000 mg | ORAL_TABLET | Freq: Four times a day (QID) | ORAL | Status: DC
  Administered 2011-10-17 – 2011-10-19 (×10): 600 mg via ORAL
  Filled 2011-10-16 (×10): qty 1

## 2011-10-16 MED ORDER — LANOLIN HYDROUS EX OINT: 1.0000 "application " | TOPICAL_OINTMENT | CUTANEOUS | Status: DC | PRN

## 2011-10-16 MED ORDER — FENTANYL CITRATE 0.05 MG/ML IJ SOLN: 25.0000 ug | INTRAMUSCULAR | Status: DC | PRN

## 2011-10-16 MED ORDER — OXYCODONE-ACETAMINOPHEN 5-325 MG PO TABS: 1.0000 | ORAL_TABLET | ORAL | Status: DC | PRN

## 2011-10-16 MED ORDER — DIBUCAINE 1 % RE OINT: 1.0000 "application " | TOPICAL_OINTMENT | RECTAL | Status: DC | PRN

## 2011-10-16 MED ORDER — ONDANSETRON HCL 4 MG PO TABS: 4.0000 mg | ORAL_TABLET | ORAL | Status: DC | PRN

## 2011-10-16 MED ORDER — SCOPOLAMINE 1 MG/3DAYS TD PT72
1.0000 | MEDICATED_PATCH | Freq: Once | TRANSDERMAL | Status: DC
  Administered 2011-10-16: 1.5 mg via TRANSDERMAL

## 2011-10-16 MED ORDER — SODIUM CHLORIDE 0.9 % IV SOLN: 1.0000 ug/kg/h | INTRAVENOUS | Status: DC | PRN

## 2011-10-16 MED ORDER — NALOXONE HCL 0.4 MG/ML IJ SOLN: 0.4000 mg | INTRAMUSCULAR | Status: DC | PRN

## 2011-10-16 MED ORDER — PROMETHAZINE HCL 25 MG/ML IJ SOLN: 6.2500 mg | INTRAMUSCULAR | Status: DC | PRN

## 2011-10-16 MED ORDER — SIMETHICONE 80 MG PO CHEW: 80.0000 mg | CHEWABLE_TABLET | ORAL | Status: DC | PRN

## 2011-10-16 MED ORDER — LORATADINE 10 MG PO TABS
10.0000 mg | ORAL_TABLET | Freq: Every day | ORAL | Status: DC
  Filled 2011-10-16 (×3): qty 1

## 2011-10-16 SURGICAL SUPPLY — 24 items
CHLORAPREP W/TINT 26ML (MISCELLANEOUS) ×2 IMPLANT
DRESSING TELFA 8X3 (GAUZE/BANDAGES/DRESSINGS) ×1 IMPLANT
ELECT REM PT RETURN 9FT ADLT (ELECTROSURGICAL) ×2
ELECTRODE REM PT RTRN 9FT ADLT (ELECTROSURGICAL) ×1 IMPLANT
EXTRACTOR VACUUM M CUP 4 TUBE (SUCTIONS) IMPLANT
GAUZE SPONGE 4X4 12PLY STRL LF (GAUZE/BANDAGES/DRESSINGS) ×1 IMPLANT
GLOVE BIOGEL PI IND STRL 6.5 (GLOVE) ×2 IMPLANT
GLOVE BIOGEL PI INDICATOR 6.5 (GLOVE) ×2
GLOVE SURG SS PI 6.0 STRL IVOR (GLOVE) ×2 IMPLANT
GOWN PREVENTION PLUS LG XLONG (DISPOSABLE) ×6 IMPLANT
NDL KEITH (NEEDLE) IMPLANT
NEEDLE KEITH (NEEDLE) ×2 IMPLANT
NS IRRIG 1000ML POUR BTL (IV SOLUTION) ×2 IMPLANT
PACK C SECTION WH (CUSTOM PROCEDURE TRAY) ×2 IMPLANT
PAD ABD 7.5X8 STRL (GAUZE/BANDAGES/DRESSINGS) ×1 IMPLANT
RTRCTR C-SECT PINK 25CM LRG (MISCELLANEOUS) ×2 IMPLANT
STAPLER VISISTAT 35W (STAPLE) ×2 IMPLANT
SUT PLAIN 2 0 (SUTURE) ×2
SUT PLAIN ABS 2-0 CT1 27XMFL (SUTURE) IMPLANT
SUT VIC AB 0 CT1 36 (SUTURE) ×10 IMPLANT
TAPE CLOTH SURG 4X10 WHT LF (GAUZE/BANDAGES/DRESSINGS) ×1 IMPLANT
TOWEL OR 17X24 6PK STRL BLUE (TOWEL DISPOSABLE) ×4 IMPLANT
TRAY FOLEY CATH 14FR (SET/KITS/TRAYS/PACK) ×2 IMPLANT
WATER STERILE IRR 1000ML POUR (IV SOLUTION) ×2 IMPLANT

## 2011-10-16 NOTE — Anesthesia Postprocedure Evaluation (Signed)
Anesthesia Post Note  Patient: [name]  Procedure(s) Performed: Procedure(s) (LRB): CESAREAN SECTION (N/A)  Anesthesia type: Spinal  Patient location: PACU  Post pain: Pain level controlled  Post assessment: Post-op Vital signs reviewed  Last Vitals:  Filed Vitals:   10/16/11 1615  BP: 117/73  Pulse: 67  Temp:   Resp: 13    Post vital signs: Reviewed  Level of consciousness: awake  Complications: No apparent anesthesia complications

## 2011-10-16 NOTE — Anesthesia Preprocedure Evaluation (Signed)
Anesthesia Evaluation  Patient identified by MRN, date of birth, ID band Patient awake    Reviewed: Allergy & Precautions, H&P , Patient's Chart, lab work & pertinent test results  Airway Mallampati: II TM Distance: >3 FB Neck ROM: full    Dental No notable dental hx.    Pulmonary neg pulmonary ROS,  breath sounds clear to auscultation  Pulmonary exam normal       Cardiovascular negative cardio ROS  Rhythm:regular Rate:Normal     Neuro/Psych negative neurological ROS  negative psych ROS   GI/Hepatic negative GI ROS, Neg liver ROS,   Endo/Other  negative endocrine ROS  Renal/GU negative Renal ROS     Musculoskeletal   Abdominal   Peds  Hematology negative hematology ROS (+)   Anesthesia Other Findings Fibroids   myomectomy IBS (irritable bowel syndrome)        Herpes genitalia 2008 Last outbreak Nov 2012 Abnormal Pap smear 2004 colposcopy    Hypertension   2009 Pre-eclampsia   2011    Reproductive/Obstetrics (+) Pregnancy                           Anesthesia Physical Anesthesia Plan  ASA: II  Anesthesia Plan: Epidural   Post-op Pain Management:    Induction:   Airway Management Planned:   Additional Equipment:   Intra-op Plan:   Post-operative Plan:   Informed Consent: I have reviewed the patients History and Physical, chart, labs and discussed the procedure including the risks, benefits and alternatives for the proposed anesthesia with the patient or authorized representative who has indicated his/her understanding and acceptance.     Plan Discussed with:   Anesthesia Plan Comments:         Anesthesia Quick Evaluation

## 2011-10-16 NOTE — Anesthesia Postprocedure Evaluation (Deleted)
Anesthesia Post Note  Patient: [name]  Procedure(s) Performed: Procedure(s) (LRB): CESAREAN SECTION (N/A)  Anesthesia type: Spinal  Patient location: PACU  Post pain: Pain level controlled  Post assessment: Post-op Vital signs reviewed  Last Vitals:  Filed Vitals:   10/16/11 1318  BP: 132/84  Pulse: 96  Temp: 36.6 C  Resp: 18    Post vital signs: Reviewed  Level of consciousness: awake  Complications: No apparent anesthesia complications

## 2011-10-16 NOTE — H&P (Signed)
[name] is a 31 y.o. female [phone] presenting for scheduled repeat cesarean section at 37 weeks secondary to previous myomectomy. Patient had prenatal care at our [facility] office complicated by previous cesarean section and previous myomectomy (involving fundal dissection).  Patient is doing well and is without any other complaints  History OB History    Grav Para Term Preterm Abortions TAB SAB Ect Mult Living   4 1 0 1 2  2   1      Past Medical History  Diagnosis Date  . Fibroids     myomectomy  . IBS (irritable bowel syndrome)   . Herpes genitalia 2008    Last outbreak Nov 2012  . Abnormal Pap smear 2004    colposcopy  . Hypertension     2009  . Pre-eclampsia     2011   Past Surgical History  Procedure Date  . Dilation and curettage of uterus   . Myomectomy abdominal approach   . Lt breast biopsy   . Dilation and curettage of uterus     x2  . Cesarean section   . Dilation and curettage of uterus     Miscarriages x 2   Family History: family history includes Alcohol abuse in her father; Breast cancer in her maternal aunt and paternal aunt; Heart attack in her paternal uncle; Hypertension in her maternal aunt and mother; and Stroke in her maternal grandmother. Social History:  reports that she has never smoked. She has never used smokeless tobacco. She reports that she drinks alcohol. She reports that she does not use illicit drugs.   Prenatal Transfer Tool  Maternal Diabetes: No Genetic Screening: Normal Maternal Ultrasounds/Referrals: Normal Fetal Ultrasounds or other Referrals:  None Maternal Substance Abuse:  No Significant Maternal Medications:  None Significant Maternal Lab Results:  None Other Comments:  None  Review of Systems  All other systems reviewed and are negative.      Blood pressure 132/84, pulse 96, temperature 97.9 F (36.6 C), temperature source Oral, resp. rate 18, height 5\' 8"  (1.727 m), weight 94.348 kg (208 lb), last menstrual  period 01/30/2011, SpO2 100.00%. Exam Physical Exam  GENERAL: Well-developed, well-nourished female in no acute distress.  ABDOMEN: Soft, Gravid, nontender. PELVIC: Not indicated EXTREMITIES: No cyanosis, clubbing, or edema, 2+ distal pulses.  Prenatal labs: ABO, Rh: O/POS/-- (12/03 1625) Antibody: NEG (12/03 1625) Rubella: 21.1 (12/03 1625) RPR: NON REAC (04/15 0855)  HBsAg: NEGATIVE (12/03 1625)  HIV: NON REACTIVE (04/15 0855)  GBS:     Assessment/Plan: 31 yo [id number] at 37 weeks here for scheduled repeat cesarean section due to previous myomectomy involving trans fundal dissection - Risks, benefits and alternatives explained including but not limited to risk of bleeding, infection and damage to adjacent organs. Patient verbalized understanding and all questions were answered. - Patient's husband had a vasectomy   [name] 10/16/2011, 2:07 PM

## 2011-10-16 NOTE — Anesthesia Procedure Notes (Signed)
Spinal  Patient location during procedure: OR Start time: 10/16/2011 2:37 PM Staffing Anesthesiologist: Brayton Caves R Performed by: anesthesiologist  Preanesthetic Checklist Completed: patient identified, site marked, surgical consent, pre-op evaluation, timeout performed, IV checked, risks and benefits discussed and monitors and equipment checked Spinal Block Patient position: sitting Prep: DuraPrep Patient monitoring: heart rate, cardiac monitor, continuous pulse ox and blood pressure Approach: midline Location: L3-4 Injection technique: single-shot Needle Needle type: Sprotte  Needle gauge: 24 G Needle length: 9 cm Assessment Sensory level: T4 Additional Notes Patient identified.  Risk benefits discussed including failed block, incomplete pain control, headache, nerve damage, paralysis, blood pressure changes, nausea, vomiting, reactions to medication both toxic or allergic, and postpartum back pain.  Patient expressed understanding and wished to proceed.  All questions were answered.  Sterile technique used throughout procedure.  CSF was clear.  No parasthesia or other complications.  Please see nursing notes for vital signs.

## 2011-10-16 NOTE — Op Note (Signed)
[name] PROCEDURE DATE: 10/16/2011  PREOPERATIVE DIAGNOSIS: Intrauterine pregnancy at  [redacted]w[redacted]d weeks gestation; previous myomectomy and previous cesarean section  POSTOPERATIVE DIAGNOSIS: The same  PROCEDURE:     Cesarean Section  SURGEON:  Dr. Catalina Antigua  ASSISTANT: none  INDICATIONS: [name] is a 31 y.o. [id number] at [redacted]w[redacted]d scheduled for cesarean section secondary to previous myomectomy with fundal dissection.  The risks of cesarean section discussed with the patient included but were not limited to: bleeding which may require transfusion or reoperation; infection which may require antibiotics; injury to bowel, bladder, ureters or other surrounding organs; injury to the fetus; need for additional procedures including hysterectomy in the event of a life-threatening hemorrhage; placental abnormalities wth subsequent pregnancies, incisional problems, thromboembolic phenomenon and other postoperative/anesthesia complications. The patient concurred with the proposed plan, giving informed written consent for the procedure.    FINDINGS:  Viable female infant in cephalic presentation.  Apgars 8 and 9, weight unavailable at time of writing this note.  Clear amniotic fluid.  Intact placenta, three vessel cord.  Normal uterus, fallopian tubes and ovaries bilaterally.  ANESTHESIA:    Spinal INTRAVENOUS FLUIDS:2200 ml ESTIMATED BLOOD LOSS: 700 ml URINE OUTPUT:  200 ml SPECIMENS: Placenta sent to L&D COMPLICATIONS: None immediate  PROCEDURE IN DETAIL:  The patient received intravenous antibiotics and had sequential compression devices applied to her lower extremities while in the preoperative area.  She was then taken to the operating room where anesthesia was induced and was found to be adequate. A foley catheter was placed into her bladder and attached to [name] gravity. She was then placed in a dorsal supine position with a leftward tilt, and prepped and draped in a sterile manner. After  an adequate timeout was performed, a Pfannenstiel skin incision was made with scalpel and carried through to the underlying layer of fascia. The fascia was incised in the midline and this incision was extended bilaterally using the Mayo scissors. Kocher clamps were applied to the superior aspect of the fascial incision and the underlying rectus muscles were dissected off bluntly. A similar process was carried out on the inferior aspect of the facial incision. The rectus muscles were separated in the midline bluntly and the peritoneum was entered bluntly. The Alexis self-retaining retractor was introduced into the abdominal cavity. Attention was turned to the lower uterine segment where a bladder flap was created, and a transverse hysterotomy was made with a scalpel and extended bilaterally bluntly. The infant was successfully delivered, and cord was clamped and cut and infant was handed over to awaiting neonatology team. Uterine massage was then administered and the placenta delivered intact with three-vessel cord. The uterus was cleared of clot and debris.  The hysterotomy was closed with 0 Vicryl in a running locked fashion, and an imbricating layer was also placed with a 0 Vicryl. Overall, excellent hemostasis was noted. The pelvis copiously irrigated and cleared of all clot and debris. Hemostasis was confirmed on all surfaces.  The peritoneum and the muscles were reapproximated using 0 vicryl interrupted stitches. The fascia was then closed using 0 Vicryl in a running locked fashion.  The subcutaneous layer was reapproximated with plain gut and the skin was closed in a subcuticular fashion using 3.0 Vicryl. The patient tolerated the procedure well. Sponge, lap, instrument and needle counts were correct x 2. She was taken to the recovery room in stable condition.    [name],PEGGYMD  10/16/2011 3:32 PM

## 2011-10-16 NOTE — Transfer of Care (Signed)
Immediate Anesthesia Transfer of Care Note  Patient: [name]  Procedure(s) Performed: Procedure(s) (LRB): CESAREAN SECTION (N/A)  Patient Location: PACU  Anesthesia Type: Spinal  Level of Consciousness: awake, alert  and oriented  Airway & Oxygen Therapy: Patient Spontanous Breathing  Post-op Assessment: Report given to PACU RN and Post -op Vital signs reviewed and stable  Post vital signs: stable  Complications: No apparent anesthesia complications

## 2011-10-17 ENCOUNTER — Encounter (HOSPITAL_COMMUNITY): Payer: Self-pay | Admitting: Obstetrics and Gynecology

## 2011-10-17 LAB — CBC
MCHC: 33.2 g/dL (ref 30.0–36.0)
RDW: 14.1 % (ref 11.5–15.5)

## 2011-10-17 NOTE — Progress Notes (Signed)
I have seen and examined this patient and I agree with the above. Cam Hai 7:51 AM 10/17/2011

## 2011-10-17 NOTE — Progress Notes (Signed)
Subjective: Postpartum Day 1: Cesarean Delivery Patient reports tolerating PO, + flatus and no problems voiding.    Objective: Vital signs in last 24 hours: Temp:  [97.3 F (36.3 C)-98.4 F (36.9 C)] 98.2 F (36.8 C) (06/25 0630) Pulse Rate:  [67-96] 74  (06/25 0630) Resp:  [13-20] 18  (06/25 0630) BP: (103-135)/(45-89) 114/78 mmHg (06/25 0630) SpO2:  [93 %-100 %] 97 % (06/25 0630)  Physical Exam:  General: alert, cooperative, appears stated age and no distress Lochia: appropriate Uterine Fundus: firm Incision: healing well, no significant drainage, no dehiscence, no significant erythema DVT Evaluation: No evidence of DVT seen on physical exam. Negative Homan's sign. No cords or calf tenderness. No significant calf/ankle edema.   Basename 10/17/11 0543 10/16/11 1322  HGB 11.4* 12.8  HCT 34.3* 38.2    Assessment/Plan: Status post Cesarean section. Doing well postoperatively.  Continue current care.  Andrena Mews, DO [facility] Resident - PGY-1 10/17/2011 7:29 AM

## 2011-10-18 NOTE — Progress Notes (Signed)
I have seen and examined this patient and I agree with the above. Declines early d/c. Cam Hai 8:07 AM 10/18/2011

## 2011-10-18 NOTE — Progress Notes (Signed)
Medical Student Daily Progress Note  Subjective: PPD#2, Patient doing well, no problems with bladder and urinating well. Flatus +, no BM. Denies fevers, chills, night sweats.  Objective: Vital signs in last 24 hours: Filed Vitals:   10/17/11 1030 10/17/11 1414 10/17/11 2100 10/18/11 0507  BP: 116/77 118/78 124/80 123/86  Pulse: 66 68 77 87  Temp: 98.1 F (36.7 C) 98.7 F (37.1 C) 98.8 F (37.1 C) 97.6 F (36.4 C)  TempSrc: Oral Oral Oral Oral  Resp: 18 18 18 18   Height:      Weight:      SpO2:   96%    Weight change:   Intake/Output Summary (Last 24 hours) at 10/18/11 0730 Last data filed at 10/17/11 1153  Gross per 24 hour  Intake      0 ml  Output   1350 ml  Net  -1350 ml   Physical Exam: BP 123/86  Pulse 87  Temp 97.6 F (36.4 C) (Oral)  Resp 18  Ht 5\' 8"  (1.727 m)  Wt 94.348 kg (208 lb)  BMI 31.63 kg/m2  SpO2 96%  LMP 01/30/2011  Breastfeeding? Unknown General appearance: alert, cooperative and no distress Head: Normocephalic, without obvious abnormality, atraumatic Extremities: edema 1+ pitting B/L LE Skin: Skin color, texture, turgor normal. No rashes or lesions Lochia: appropriate Uterine fundus: firm Incision: healing well, no drainage, no rubor/tumor/calor DVT eval: no evidence of DVT, Homan's negative  Lab Results: CBC:  Lab 10/17/11 0543 10/16/11 1322  WBC 5.1 6.1  NEUTROABS -- --  HGB 11.4* 12.8  HCT 34.3* 38.2  MCV 91.0 91.4  PLT 243 263   Micro Results: Recent Results (from the past 240 hour(s))  SURGICAL PCR SCREEN     Status: Normal   Collection Time   10/16/11  1:22 PM      Component Value Range Status Comment   MRSA, PCR NEGATIVE  NEGATIVE Final    Staphylococcus aureus NEGATIVE  NEGATIVE Final    Studies/Results: No results found. Medications:  I have reviewed the patient's current medications. Anti-infectives     Start     Dose/Rate Route Frequency Ordered Stop   10/17/11 0600   ceFAZolin (ANCEF) IVPB 2 g/50 mL premix   Status:  Discontinued        2 g 100 mL/hr over 30 Minutes Intravenous On call to O.R. 10/16/11 1425 10/16/11 1431   10/16/11 1431   ceFAZolin (ANCEF) 1-5 GM-% IVPB     Comments: HARVELL, DAWN: cabinet override         10/16/11 1431 10/16/11 1427         Scheduled Meds:   . ibuprofen  600 mg Oral Q6H  . loratadine  10 mg Oral QHS  . prenatal multivitamin  1 tablet Oral Daily  . scopolamine  1 patch Transdermal Once  . senna-docusate  2 tablet Oral QHS  . simethicone  80 mg Oral TID PC & HS   Continuous Infusions:   . lactated ringers 125 mL/hr at 10/17/11 0231  . naloxone [facility]) infusion    . oxytocin 40 units in LR 1000 mL     PRN Meds:.acetaminophen, dibucaine, diphenhydrAMINE, diphenhydrAMINE, diphenhydrAMINE, diphenhydrAMINE, ketorolac, ketorolac, lanolin, menthol-cetylpyridinium, metoCLOPramide (REGLAN) injection, nalbuphine, nalbuphine, naloxone (NARCAN) infusion, naloxone, ondansetron (ZOFRAN) IV, ondansetron (ZOFRAN) IV, ondansetron, oxyCODONE-acetaminophen, simethicone, sodium chloride, witch hazel-glycerin Assessment/Plan: S/p Cesarean section - doing well post-op Continue current care    LOS: 2 days   This is a Psychologist, occupational Note.  The care of  the patient was discussed with Philipp Deputy, CNM and the assessment and plan formulated with their assistance.  Please see their attached note for official documentation of the daily encounter.  Lewie Chamber 10/18/2011, 7:30 AM

## 2011-10-19 MED ORDER — VITAMIN D 1000 UNITS PO CAPS: 1000.0000 [IU] | ORAL_CAPSULE | Freq: Every day | ORAL | Status: DC

## 2011-10-19 MED ORDER — IBUPROFEN 600 MG PO TABS: 600.0000 mg | ORAL_TABLET | Freq: Four times a day (QID) | ORAL | Status: AC

## 2011-10-19 MED ORDER — PRENATAL MULTIVITAMIN CH: 1.0000 | ORAL_TABLET | Freq: Every day | ORAL | Status: DC

## 2011-10-19 MED ORDER — HYDROCODONE-ACETAMINOPHEN 5-500 MG PO TABS: 1.0000 | ORAL_TABLET | Freq: Four times a day (QID) | ORAL | Status: AC | PRN

## 2011-10-19 MED ORDER — FAMOTIDINE 20 MG PO TABS: 20.0000 mg | ORAL_TABLET | Freq: Two times a day (BID) | ORAL | Status: DC

## 2011-10-19 NOTE — Discharge Summary (Signed)
Obstetric Discharge Summary Reason for Admission: cesarean section Prenatal Procedures: none Intrapartum Procedures: cesarean: low cervical, transverse Postpartum Procedures: none Complications-Operative and Postpartum: none Hemoglobin  Date Value Range Status  10/17/2011 11.4* 12.0 - 15.0 g/dL Final     HCT  Date Value Range Status  10/17/2011 34.3* 36.0 - 46.0 % Final    Physical Exam:  General: alert, cooperative, appears stated age and no distress Lochia: appropriate Uterine Fundus: firm Incision: healing well, no significant drainage, no dehiscence, no significant erythema DVT Evaluation: No evidence of DVT seen on physical exam. Negative Homan's sign. No cords or calf tenderness. No significant calf/ankle edema.  Discharge Diagnoses: Term Pregnancy-delivered Prior Myomectomy requiring early term cesarean  Discharge Information: Date: 10/19/2011 Activity: pelvic rest Diet: routine Medications: Ibuprofen and Vicodin Condition: stable Instructions: refer to practice specific booklet Discharge to: home Follow-up Information    Follow up with [facility] [place]. Schedule an appointment as soon as possible for a visit in 6 weeks.         Newborn Data: Live born female  Birth Weight: 5 lb 8.2 oz (2500 g) APGAR: 8, 9  Home with mother.  RIGBY, MICHAEL 10/19/2011, 7:40 AM

## 2011-10-19 NOTE — Discharge Instructions (Signed)

## 2011-10-25 ENCOUNTER — Ambulatory Visit (HOSPITAL_COMMUNITY)
Admission: RE | Admit: 2011-10-25 | Discharge: 2011-10-25 | Payer: BC Managed Care – PPO | Source: Ambulatory Visit | Attending: Obstetrics & Gynecology | Admitting: Obstetrics & Gynecology

## 2011-10-25 NOTE — Progress Notes (Signed)
Adult Lactation Consultation Outpatient Visit Note  Patient Name: [name]         Baby:  [name] Date of Birth: [birth year]                    DOB:  [birth year] Gestational Age at Delivery: [redacted]w[redacted]d   BIRTH WEIGHT: 5-8 Type of Delivery: C/S                          DISCHARGE WEIGHT: 5-0.1                                                              WEIGHT TODAY:  5-5 Breastfeeding History: Frequency of Breastfeeding:every 3-4 hours  Length of Feeding: 45 minutes total, baby nurses on both breasts Voids: 6+ Stools: 4 yellow  Supplementing / Method:per choice so dad can feed/60 mls EBM twice per day Pumping:  Type of Pump:pump in style   Frequency:3 times per day  Volume:  2 oz from each breast  Comments:    Consultation Evaluation: Mom and 9 day old baby here for feeding assessment due to nipple shield use.  Mom attended BF support group yesterday.  Mom reports very good milk supply and active feedings using the nipple shield.  She states that baby comes off breast content and she notices good breast softening after feeds.  Mom has attempted to latch baby without the shield but baby still has difficulty.  Both breast full today.  Mom latched baby  easily onto left breast using 20 mm nipple shield.  Baby pulling in an adequate amount of breast tissue and nurses vigorous with a lot of gulping heard.  Demonstrated to Mom how to use good breast massage and compression to enhance feeding and breast emptying. Baby transferred in 20 minutes and too full for other breast.  Mom praised for all her efforts and her questions answered.  Initial Feeding Assessment:Left breast x 20 minutes Pre-feed [id number] Post-feed [id number] Amount Transferred:78 mls Comments:  Additional Feeding Assessment: Pre-feed Weight: Post-feed Weight: Amount Transferred: Comments:  Additional Feeding Assessment: Pre-feed Weight: Post-feed Weight: Amount Transferred: Comments:  Total Breast milk  Transferred this Visit: 78 mls Total Supplement Given: none  Additional Interventions:   Follow-Up Pedi appointment 10/31/11.  Mom will call LC office prn and plans to attend BF support group      Hansel Feinstein 10/25/2011, 12:04 PM

## 2011-11-20 ENCOUNTER — Encounter: Payer: Self-pay | Admitting: Family

## 2011-11-20 ENCOUNTER — Ambulatory Visit (INDEPENDENT_AMBULATORY_CARE_PROVIDER_SITE_OTHER): Payer: BC Managed Care – PPO | Admitting: Family

## 2011-11-20 MED ORDER — IBUPROFEN 600 MG PO TABS: 600.0000 mg | ORAL_TABLET | Freq: Four times a day (QID) | ORAL | Status: DC | PRN

## 2011-11-20 MED ORDER — OXYCODONE-ACETAMINOPHEN 5-500 MG PO CAPS: 1.0000 | ORAL_CAPSULE | ORAL | Status: DC | PRN

## 2011-11-20 NOTE — Progress Notes (Signed)
  Subjective:     [name] is a 31 y.o. female who presents for a postpartum visit. She is 5 weeks postpartum following a low cervical transverse Cesarean section. I have fully reviewed the prenatal and intrapartum course. The delivery was at 37 gestational weeks. Outcome: repeat cesarean section, low transverse incision. Anesthesia: spinal. Postpartum course has been unremarkable. Baby's course has been unremarkable. Baby is feeding by breast. Bleeding no bleeding. Bowel function is normal. Bladder function is normal. Patient is sexually active. Contraception method is vasectomy. Postpartum depression screening: negative.  Reports some incisional pain.  The following portions of the patient's history were reviewed and updated as appropriate: allergies, current medications, past family history, past medical history, past social history, past surgical history and problem list.  Review of Systems Pertinent items are noted in HPI.   Objective:    BP 138/99  Pulse 84  Ht 5\' 8"  (1.727 m)  Wt 189 lb (85.73 kg)  BMI 28.74 kg/m2  Breastfeeding? Yes  General:  alert, cooperative and appears stated age   Breasts:  inspection negative, no nipple discharge or bleeding, no masses or nodularity palpable  Lungs: clear to auscultation bilaterally  Heart:  regular rate and rhythm, S1, S2 normal, no murmur, click, rub or gallop  Abdomen: soft, non-tender; bowel sounds normal; no masses,  no organomegaly; incision site healed well, no signs of infection   Vulva:  normal  Vagina: normal vagina  Cervix:  no cervical motion tenderness  Corpus: normal size, contour, position, consistency, mobility, non-tender  Adnexa:  normal adnexa  Rectal Exam: Normal rectovaginal exam        Assessment:    Normal postpartum exam. Pap smear not done at today's visit.   Plan:    1. Contraception: vasectomy 2. RX Ibuprofen; Tylenol #3 3. Follow up as needed.

## 2011-11-22 ENCOUNTER — Encounter (HOSPITAL_COMMUNITY)
Admission: RE | Admit: 2011-11-22 | Discharge: 2011-11-22 | Disposition: A | Discharge status: Home / Self Care | Payer: BC Managed Care – PPO | Source: Ambulatory Visit | Attending: Obstetrics & Gynecology | Admitting: Obstetrics & Gynecology

## 2011-11-22 NOTE — Progress Notes (Signed)
Infant Lactation Consultation Outpatient Visit Note  Patient Name: Chery Giusto Date of Birth: [birth year] Birth Weight:   Gestational Age at Delivery: Gestational Age: <None> 37 week per mom  Type of Delivery: 6/24 C/section tx with antibiotics in the hospital                              Birth weight = 5-8.2 oz  , D/c weight= 5.0.1 oz   Mom presents today due to sore nipples and possible decrease in milk supply per mom    Baby Naomi - 5 weeks and 2 days   Breastfeeding History per mom Naomi fed well in the hospital and has fed well at home.  For the last week has experienced with nipple soreness and latch. Once the depth is obtained  The discomfort does go away. Mom denies s/s's of yeast! Did receive antibiotics in the hospital.  Had been using a nipple shield initially and had stopped and last evening due to soreness had resumed  Using it.   Also has been taking Motherloves capsules ( 2-3 x's ) per day due to concerns over milk supply.                 Last weight - 6-7 oz 7/23 at the BF support group.                                     Noted the infant to have a white coated tongue  Also mom mentioned the infant has been very gasey . No diaper rash noted. LC suspects "THRUSH" . Mom also has early signs ( nipple soreness)  Frequency of Breastfeeding: every 2 hours days and evenings and @ night every 4 hours  Length of Feeding: 40 mins and content between feedings per mom  Voids: >6 Stools: 3-4 yellow seedy   Supplementing / Method: Pumping:  Type of Pump:DEBP Medela    Frequency: some pumping to start transitioning back to work   Volume:    Comments:    Consultation Evaluation:  Initial Feeding Assessment: right breast  Pre-feed Weight:6-13.6 oz, 3106 g  Post-feed Weight: 6-15.2 oz 3152g  Amount Transferred: 46 ml  Comments:Fed 15 mins with depth and sustained the latch well . Worked with mom to obtain the depth and she was comfortable   Additional Feeding  Assessment:re-latched right breast due to fullness  Pre-feed Weight:6-14.5 oz -3134 g ( reweight due to wet diaper )  Post-feed Weight:6-15.1 oz 3150g  Amount Transferred:16 ml  Comments:Fed another 10 mins on the right . Mom did better obtaining the depth   Additional Feeding Assessment: Pre-feed Weight:6-15.1 oz 3150g  Post-feed Weight:6-15.7 oz 3168g  Amount Transferred:38ml  Comments:latched well with depth and mom was able to do it independently .   Total Breast milk Transferred this Visit: 70 ml  Total Supplement Given:none    Additional Interventions:   BABY GAINED ^?% OZ in 1 week and 1 day !!    Follow-Up1() call Pedis for assessment of infant's tongue to R/O thrush ! 2) mom start tx for yeast on the hand out 30 Work with naomi to open mouth wide and obtain the depth at the breast. 3) Transitioning for work - 1) after am feeding pump plan pumping for 10-15 mins , post midday feeding , and post early evening feeding.  Kathrin Greathouse 11/22/2011, 7:37 PM

## 2011-12-01 ENCOUNTER — Ambulatory Visit (INDEPENDENT_AMBULATORY_CARE_PROVIDER_SITE_OTHER): Payer: BC Managed Care – PPO | Admitting: Family Medicine

## 2011-12-01 ENCOUNTER — Encounter: Payer: Self-pay | Admitting: Family Medicine

## 2011-12-01 VITALS — BP 142/95 | HR 100 | Resp 18 | Wt 190.0 lb

## 2011-12-01 DIAGNOSIS — I10 Essential (primary) hypertension: Secondary | ICD-10-CM

## 2011-12-01 DIAGNOSIS — R0982 Postnasal drip: Secondary | ICD-10-CM

## 2011-12-01 MED ORDER — METOPROLOL SUCCINATE ER 50 MG PO TB24: 50.0000 mg | ORAL_TABLET | Freq: Every day | ORAL | Status: DC

## 2011-12-01 NOTE — Progress Notes (Signed)
CC: [name] is a 31 y.o. female is here for Hypertension   Subjective: HPI:  Very pleasant 31 year old 6 weeks postpartum from an uncomplicated pregnancy however prior pregnancy approximately 2 years ago frequency requiring 2 weeks bedrest. She presents today at the request of her obstetrician to elevated blood pressures following delivery of her most recent child, [name]. Patient has not been taking her blood pressure at home. She was on metoprolol in the past and she believes she may have been on hydrochlorothiazide many years ago. She has never had blood pressure issues until her episode of preeclampsia. She denies any motor or sensory disturbances headaches, palpitations, chest pain, shortness of breath, orthopnea, nor edema (since delivering her child). She does not imbibe in salty food such as canned goods, micro-meals, fast foods. She's currently breast-feeding.  She has an additional complaint of a sore throat for the past "days". She describes this as a pain when swallowing on the right side of her throat. There is no pain with moving her neck. She denies fevers chills no cough. There have been no interventions as of yet.   Review Of Systems Outlined In HPI  Past Medical History  Diagnosis Date  . Fibroids     myomectomy  . IBS (irritable bowel syndrome)   . Herpes genitalia 2008    Last outbreak Nov 2012  . Abnormal Pap smear 2004    colposcopy  . Hypertension     2009  . Pre-eclampsia     2011  . Preeclampsia      Family History  Problem Relation Age of Onset  . Alcohol abuse Father     parent  . Breast cancer Maternal Aunt     2 maternal aunt  . Breast cancer Paternal Aunt     paternal aunt  . Hypertension Maternal Aunt     aunt  . Hypertension Mother     parent  . Heart attack Paternal Uncle     2 uncles  . Stroke Maternal Grandmother     TIA's     History  Substance Use Topics  . Smoking status: Never Smoker   . Smokeless tobacco: Never Used  .  Alcohol Use: Yes     rarely     Objective: Filed Vitals:   12/01/11 1332  BP: 142/95  Pulse: 100  Resp: 18    General: Alert and Oriented, No Acute Distress HEENT: Pupils equal, round, reactive to light. Conjunctivae clear.  External ears unremarkable, canals clear with intact TMs with appropriate landmarks.  Middle ear appears open without effusion. Pink inferior turbinates.  Moist mucous membranes, pharynx without inflammation nor lesions however moderate cobblestoning of posterior pharynx.  Neck supple without palpable lymphadenopathy nor abnormal masses. Lungs: Clear to auscultation bilaterally, no wheezing/ronchi/rales.  Comfortable work of breathing. Good air movement. Cardiac: Regular rate and rhythm. Normal S1/S2.  No murmurs, rubs, nor gallops.   Extremities: No peripheral edema.  Strong peripheral pulses.  Mental Status: No depression, anxiety, nor agitation. Skin: Warm and dry.  Assessment & Plan: Laurine was seen today for hypertension.  Diagnoses and associated orders for this visit:  Hypertension, benign - metoprolol succinate (TOPROL XL) 50 MG 24 hr tablet; Take 1 tablet (50 mg total) by mouth daily. Take with or immediately following a meal.  Post-nasal drip    For her postnasal drip I encouraged saline washes on as-needed basis and encouraged her to consider Zyrtec trial. For her hypertension she had done well metoprolol in the past  therefore restart this literature review shows that it is safe in breast-feeding however I went over signs and symptoms the patient to watch out for with respect to [facility] which might signal a need to switch to a different agent. Patient is in agreement to return next week for blood pressure check. Return in about 1 week (around 12/08/2011) for BP Check with Shannie Kontos or [facility].  Requested Prescriptions   Signed Prescriptions Disp Refills  . metoprolol succinate (TOPROL XL) 50 MG 24 hr tablet 30 tablet 5    Sig: Take 1 tablet (50 mg  total) by mouth daily. Take with or immediately following a meal.

## 2011-12-06 ENCOUNTER — Ambulatory Visit (INDEPENDENT_AMBULATORY_CARE_PROVIDER_SITE_OTHER): Payer: BC Managed Care – PPO | Admitting: Obstetrics & Gynecology

## 2011-12-06 ENCOUNTER — Encounter: Payer: Self-pay | Admitting: Obstetrics & Gynecology

## 2011-12-06 VITALS — BP 139/99 | HR 116 | Temp 98.5°F | Resp 16 | Ht 68.0 in | Wt 189.0 lb

## 2011-12-06 DIAGNOSIS — R102 Pelvic and perineal pain: Secondary | ICD-10-CM

## 2011-12-06 DIAGNOSIS — N949 Unspecified condition associated with female genital organs and menstrual cycle: Secondary | ICD-10-CM

## 2011-12-06 DIAGNOSIS — O26899 Other specified pregnancy related conditions, unspecified trimester: Secondary | ICD-10-CM

## 2011-12-06 NOTE — Progress Notes (Signed)
  Subjective:    Patient ID: [name], female    DOB: August 22, 1980, 31 y.o.   MRN: [id number]  HPI  Pt concerned that she is still sore after c/s and sexual intercourse hurt.  Pt has   Review of Systems  Constitutional: Negative.   Respiratory: Negative.   Cardiovascular: Negative.   Gastrointestinal: Negative.   Genitourinary: Negative.   Psychiatric/Behavioral: Negative.   All other systems reviewed and are negative.       Objective:   Physical Exam  Vitals reviewed. Constitutional: She is oriented to person, place, and time. She appears well-developed.  HENT:  Head: Normocephalic and atraumatic.  Eyes: Conjunctivae are normal.  Pulmonary/Chest: Effort normal.  Abdominal: Soft. She exhibits no distension and no mass. There is no tenderness. There is no rebound and no guarding.  Genitourinary: Vagina normal and uterus normal.       B adnexa wnl.  No pain on exam  Musculoskeletal: She exhibits no edema.  Neurological: She is alert and oriented to person, place, and time.  Skin: Skin is warm and dry.  Psychiatric: She has a normal mood and affect.          Assessment & Plan:  31 yo female s/p c/s approx 7 weeks ago  1- No evidence of infection or post surgical complication. 2-BP elevated; pt to follow up with primary care for medication adjustment. 3-vasectomy for birth control

## 2011-12-18 ENCOUNTER — Encounter: Payer: Self-pay | Admitting: *Deleted

## 2011-12-21 ENCOUNTER — Emergency Department (INDEPENDENT_AMBULATORY_CARE_PROVIDER_SITE_OTHER): Payer: BC Managed Care – PPO

## 2011-12-21 ENCOUNTER — Encounter: Payer: Self-pay | Admitting: Emergency Medicine

## 2011-12-21 ENCOUNTER — Emergency Department (INDEPENDENT_AMBULATORY_CARE_PROVIDER_SITE_OTHER)
Admission: EM | Admit: 2011-12-21 | Discharge: 2011-12-21 | Disposition: A | Discharge status: Home / Self Care | Payer: BC Managed Care – PPO | Source: Home / Self Care

## 2011-12-21 DIAGNOSIS — S139XXA Sprain of joints and ligaments of unspecified parts of neck, initial encounter: Secondary | ICD-10-CM

## 2011-12-21 DIAGNOSIS — M4040 Postural lordosis, site unspecified: Secondary | ICD-10-CM

## 2011-12-21 DIAGNOSIS — M542 Cervicalgia: Secondary | ICD-10-CM

## 2011-12-21 DIAGNOSIS — S239XXA Sprain of unspecified parts of thorax, initial encounter: Secondary | ICD-10-CM

## 2011-12-21 DIAGNOSIS — M549 Dorsalgia, unspecified: Secondary | ICD-10-CM

## 2011-12-21 MED ORDER — KETOROLAC TROMETHAMINE 60 MG/2ML IM SOLN
60.0000 mg | Freq: Once | INTRAMUSCULAR | Status: AC
  Administered 2011-12-21: 60 mg via INTRAMUSCULAR

## 2011-12-21 MED ORDER — CYCLOBENZAPRINE HCL 5 MG PO TABS: 5.0000 mg | ORAL_TABLET | Freq: Every evening | ORAL | Status: AC | PRN

## 2011-12-21 NOTE — ED Notes (Signed)
Right side mid back pain x 3 days, no trauma, radiates up to neck

## 2011-12-21 NOTE — ED Provider Notes (Signed)
History     CSN: [id number]  Arrival date & time 12/21/11  0845   First MD Initiated Contact with Patient 12/21/11 [phone]      Chief Complaint  Patient presents with  . Back Pain   HPI  Patient presents today with chief complaint of right mid back as well as right neck pain x3 days. Patient states she initially noticed the pain while at work. Patient currently works as a Doctor, general practice at a Doctor, hospital. Patient denies any trauma associated with this pain, though patient states that she does have to move patients at times for her to do her job. Patient describes pain as aching and is worse with certain movements including certain movements including neck flexion and extension, back rotation, and certain shoulder movements. Patient also has 2 children at home there are 87 months old and 24 months old. Patient does report that she has been carrying them a regular basis. Patient is right-hand dominant. Patient does report some mild radiating pain down the right upper arm associated with some very faint intermittent tingling. No numbness or persistent paresthesias. Arm strength and sensation has been intact.  Past Medical History  Diagnosis Date  . Fibroids     myomectomy  . IBS (irritable bowel syndrome)   . Herpes genitalia 2008    Last outbreak Nov 2012  . Abnormal Pap smear 2004    colposcopy  . Hypertension     2009  . Pre-eclampsia     2011  . Preeclampsia     Past Surgical History  Procedure Date  . Dilation and curettage of uterus   . Myomectomy abdominal approach   . Lt breast biopsy   . Dilation and curettage of uterus     x2  . Cesarean section   . Dilation and curettage of uterus     Miscarriages x 2  . Cesarean section 10/16/2011    Procedure: CESAREAN SECTION;  Surgeon: Catalina Antigua, MD;  Location: [facility];  Service: Gynecology;  Laterality: N/A;    Family History  Problem Relation Age of Onset  . Alcohol abuse Father     parent  . Breast  cancer Maternal Aunt     2 maternal aunt  . Breast cancer Paternal Aunt     paternal aunt  . Hypertension Maternal Aunt     aunt  . Hypertension Mother     parent  . Heart attack Paternal Uncle     2 uncles  . Stroke Maternal Grandmother     TIA's    History  Substance Use Topics  . Smoking status: Never Smoker   . Smokeless tobacco: Never Used  . Alcohol Use: Yes     rarely    OB History    Grav Para Term Preterm Abortions TAB SAB Ect Mult Living   4 2 1 1 2  2   2       Review of Systems  All other systems reviewed and are negative.    Allergies  Guaifenesin & derivatives  Home Medications   Current Outpatient Rx  Name Route Sig Dispense Refill  . VITAMIN D 1000 UNITS PO CAPS Oral Take 1 capsule (1,000 Units total) by mouth daily.    . IBUPROFEN 600 MG PO TABS      . METOPROLOL SUCCINATE ER 50 MG PO TB24 Oral Take 1 tablet (50 mg total) by mouth daily. Take with or immediately following a meal. 30 tablet 5  . PRENATAL MULTIVITAMIN CH  Oral Take 1 tablet by mouth daily.    Marland Kitchen PROBIOTIC DAILY PO Oral Take by mouth daily.    Marland Kitchen VALACYCLOVIR HCL 500 MG PO TABS        BP 138/93  Pulse 105  Temp 98.3 F (36.8 C) (Oral)  Resp 18  Ht 5\' 8"  (1.727 m)  Wt 191 lb (86.637 kg)  BMI 29.04 kg/m2  SpO2 98%  Breastfeeding? Yes  Physical Exam  Constitutional: She appears well-developed and well-nourished.  HENT:  Head: Normocephalic and atraumatic.  Eyes: Conjunctivae are normal. Pupils are equal, round, and reactive to light.  Neck:       + TTP along R trapezius  Full ROM  Spurlings mildly positive.   Cardiovascular: Normal rate and regular rhythm.   Pulmonary/Chest: Effort normal and breath sounds normal.  Abdominal: Soft. Bowel sounds are normal.       No RUQ tenderness   Musculoskeletal:       Arms:      + TTP along R lattisimus dorsi and R lower trapezius.  + mid back pain with external rotation of R shoulder.   Neurological: She is alert. No cranial  nerve deficit.       Neurovascularly intact distally in R arm      ED Course  Procedures (including critical care time)  Labs Reviewed - No data to display No results found.   1. Thoracic back sprain   2. Neck sprain       MDM  Suspect this is a musculoskeletal strain with particular strain to R trapezius and latissimus dorsi.  Likely contributors to strain are occupation and carrying children.  RICE, NSAIDs, and tylenol.  Primarily tylenol in setting of hypertension.  Discussed flexeril for spasmodic component with breastfeeding. This was discussed with on call pharmacist. Some crossover into breast milk in category B drug. Otherwise ok for use with breast feeding.  Discussed follow up with PCP.  Also on ddx includes GI sources of pain. However, no GI red flags currently.  Would consider ultrasound for GB disease if this persists.  Handout given    The patient and/or caregiver has been counseled thoroughly with regard to treatment plan and/or medications prescribed including dosage, schedule, interactions, rationale for use, and possible side effects and they verbalize understanding. Diagnoses and expected course of recovery discussed and will return if not improved as expected or if the condition worsens. Patient and/or caregiver verbalized understanding.               Doree Albee, MD 12/21/11 1230

## 2011-12-23 ENCOUNTER — Telehealth: Payer: Self-pay

## 2011-12-23 NOTE — ED Notes (Signed)
Patient states she feels better and will call with any questions or concerns if needed.

## 2012-02-20 ENCOUNTER — Other Ambulatory Visit: Payer: Self-pay | Admitting: Family Medicine

## 2012-02-21 ENCOUNTER — Other Ambulatory Visit: Payer: Self-pay | Admitting: Family Medicine

## 2012-03-25 ENCOUNTER — Other Ambulatory Visit: Payer: Self-pay | Admitting: Family Medicine

## 2012-04-02 ENCOUNTER — Other Ambulatory Visit: Payer: Self-pay | Admitting: Family Medicine

## 2012-05-12 ENCOUNTER — Other Ambulatory Visit: Payer: Self-pay

## 2012-05-12 NOTE — Telephone Encounter (Signed)
FYI  We received a fax from [facility] for a refill on her Metoprolol Er 50 mg once daily. This medication is being refused due to the fact she needs to come in for an office visit for her Hypertension.

## 2012-05-27 ENCOUNTER — Encounter: Payer: Self-pay | Admitting: Family Medicine

## 2012-05-27 ENCOUNTER — Ambulatory Visit (INDEPENDENT_AMBULATORY_CARE_PROVIDER_SITE_OTHER): Payer: BC Managed Care – PPO | Admitting: Family Medicine

## 2012-05-27 VITALS — BP 151/93 | HR 67 | Ht 68.0 in | Wt 192.0 lb

## 2012-05-27 DIAGNOSIS — Z0000 Encounter for general adult medical examination without abnormal findings: Secondary | ICD-10-CM

## 2012-05-27 DIAGNOSIS — Z803 Family history of malignant neoplasm of breast: Secondary | ICD-10-CM

## 2012-05-27 DIAGNOSIS — I10 Essential (primary) hypertension: Secondary | ICD-10-CM

## 2012-05-27 MED ORDER — VALACYCLOVIR HCL 500 MG PO TABS: 500.0000 mg | ORAL_TABLET | Freq: Two times a day (BID) | ORAL | Status: DC

## 2012-05-27 MED ORDER — PRENATAL MULTIVITAMIN CH: 1.0000 | ORAL_TABLET | Freq: Every day | ORAL | Status: DC

## 2012-05-27 MED ORDER — METOPROLOL SUCCINATE ER 50 MG PO TB24
50.0000 mg | ORAL_TABLET | Freq: Every day | ORAL | Status: DC
Start: 2012-05-27 — End: 2012-05-31

## 2012-05-27 MED ORDER — HYOSCYAMINE SULFATE 0.125 MG SL SUBL: 0.1250 mg | SUBLINGUAL_TABLET | SUBLINGUAL | Status: DC | PRN

## 2012-05-27 NOTE — Patient Instructions (Addendum)
Keep up a regular exercise program and make sure you are eating a healthy diet Try to eat 4 servings of dairy a day, or if you are lactose intolerant take a calcium with vitamin D daily.  Your vaccines are up to date.   

## 2012-05-27 NOTE — Progress Notes (Signed)
Subjective:     [name] is a 32 y.o. female and is here for a comprehensive physical exam. The patient reports no problems.  Hypertension-no chest pain, shortness of breath, palpitations or dizziness. She admits she has not been taking her medication regularly but knows she needs to. She does follow a low-sodium diet. Some exercise but not regular.  History   Social History  . Marital Status: Married    Spouse Name: N/A    Number of Children: N/A  . Years of Education: N/A   Occupational History  . speech therapist    Social History Main Topics  . Smoking status: Never Smoker   . Smokeless tobacco: Never Used  . Alcohol Use: Yes     Comment: rarely  . Drug Use: No  . Sexually Active: Yes -- Female partner(s)     Comment: post baccalaurette degree, married, 1 girl, exercise twice weekly, no caffeine.   Other Topics Concern  . Not on file   Social History Narrative  . No narrative on file   Health Maintenance  Topic Date Due  . Pap Smear  07/06/2013  . Tetanus/tdap  10/22/2016  . Influenza Vaccine  12/24/2011    The following portions of the patient's history were reviewed and updated as appropriate: allergies, current medications, past family history, past medical history, past social history, past surgical history and problem list.  Review of Systems A comprehensive review of systems was negative.   Objective:    BP 151/93  Pulse 67  Ht 5\' 8"  (1.727 m)  Wt 192 lb (87.091 kg)  BMI 29.19 kg/m2  LMP 05/24/2012 General appearance: alert, cooperative and appears stated age Head: Normocephalic, without obvious abnormality, atraumatic Eyes: conj clear, EOMi, PEERLA Ears: normal TM's and external ear canals both ears Nose: Nares normal. Septum midline. Mucosa normal. No drainage or sinus tenderness. Throat: lips, mucosa, and tongue normal; teeth and gums normal Neck: no adenopathy, no carotid bruit, no JVD, supple, symmetrical, trachea midline and thyroid not  enlarged, symmetric, no tenderness/mass/nodules Back: symmetric, no curvature. ROM normal. No CVA tenderness. Lungs: clear to auscultation bilaterally Breasts: normal appearance, no masses or tenderness Heart: regular rate and rhythm, S1, S2 normal, no murmur, click, rub or gallop Abdomen: soft, non-tender; bowel sounds normal; no masses,  no organomegaly Extremities: extremities normal, atraumatic, no cyanosis or edema Pulses: 2+ and symmetric Skin: Skin color, texture, turgor normal. No rashes or lesions Lymph nodes: Cervical, supraclavicular, and axillary nodes normal. Neurologic: Alert and oriented X 3, normal strength and tone. Normal symmetric reflexes. Normal coordination and gait    Assessment:    Healthy female exam.      Plan:     See After Visit Summary for Counseling Recommendations  Keep up a regular exercise program and make sure you are eating a healthy diet Try to eat 4 servings of dairy a day, or if you are lactose intolerant take a calcium with vitamin D daily.  Your vaccines are up to date.   Family history breast cancer. She has 2 maternal aunts with breast cancer. One was in her 41s the other was in her 108s. She had one paternal and with breast cancer and her paternal grandmother had stomach and colon cancer. I will check and see if she could be a candidate for practicing. At this point time I don't think she necessarily needs early as cancer screening with a mammogram until age 27 is not 64.  Hypertension-uncontrolled. She admits she has not  been taking her medication consistently. I encouraged her to stay in followup in one month to make sure that she is well controlled. New prescription sent to pharmacy.

## 2012-05-31 ENCOUNTER — Telehealth: Payer: Self-pay | Admitting: *Deleted

## 2012-05-31 DIAGNOSIS — I10 Essential (primary) hypertension: Secondary | ICD-10-CM

## 2012-05-31 MED ORDER — TRIAMCINOLONE 0.1 % CREAM:EUCERIN CREAM 1:1: 1.0000 "application " | TOPICAL_CREAM | Freq: Two times a day (BID) | CUTANEOUS | Status: DC

## 2012-05-31 MED ORDER — METOPROLOL SUCCINATE ER 100 MG PO TB24: 100.0000 mg | ORAL_TABLET | Freq: Every day | ORAL | Status: DC

## 2012-05-31 NOTE — Telephone Encounter (Signed)
Sorry, forgot to sent.  Rx sent for cream.

## 2012-05-31 NOTE — Telephone Encounter (Signed)
Go ahead and increase metoprolol to 100 mg. New prescription sent to pharmacy. Given at least a couple weeks to completely taken.

## 2012-05-31 NOTE — Telephone Encounter (Signed)
Pt notified of instructions. Resent med to [facility] in [place] as [facility] was the wrong pharmacy. Also pt asked if you would send in a cream for eczema- said she thought you were gonna send it at her appt but its not at pharmacy either- said it was one you told her she would mix with eucerin cream

## 2012-05-31 NOTE — Telephone Encounter (Signed)
Pt calls and states she has taken her BP 2 times today and her BP is 150/100 and 138/100. Wants to know if she needs to increase her Metoprolol?

## 2012-06-07 ENCOUNTER — Encounter: Payer: Self-pay | Admitting: Family Medicine

## 2012-06-07 ENCOUNTER — Telehealth: Payer: Self-pay | Admitting: Family Medicine

## 2012-06-07 ENCOUNTER — Telehealth: Payer: Self-pay

## 2012-06-07 NOTE — Telephone Encounter (Signed)
Opened in error

## 2012-06-07 NOTE — Telephone Encounter (Signed)
Please call patient. Because she has to relatives on the same side of the family that had breast cancer then she should qualify for bracketing testing. If she would like to make an appointment for nurse to come in and do this and fill out the paperwork we can send it to the company. They always verified with her insurance first to make sure that there is coverage for further process the sample. Usually collect it with a mouth wash sample.

## 2012-06-07 NOTE — Telephone Encounter (Signed)
Patient advised and scheduled.  

## 2012-06-10 ENCOUNTER — Ambulatory Visit: Payer: BC Managed Care – PPO

## 2012-06-24 ENCOUNTER — Ambulatory Visit: Payer: BC Managed Care – PPO | Admitting: Family Medicine

## 2012-06-24 DIAGNOSIS — Z0289 Encounter for other administrative examinations: Secondary | ICD-10-CM

## 2012-07-05 ENCOUNTER — Encounter: Payer: Self-pay | Admitting: Physician Assistant

## 2012-07-05 ENCOUNTER — Ambulatory Visit (INDEPENDENT_AMBULATORY_CARE_PROVIDER_SITE_OTHER): Payer: BC Managed Care – PPO | Admitting: Physician Assistant

## 2012-07-05 VITALS — BP 144/76 | HR 71 | Ht 68.0 in | Wt 191.0 lb

## 2012-07-05 DIAGNOSIS — I10 Essential (primary) hypertension: Secondary | ICD-10-CM

## 2012-07-05 DIAGNOSIS — R51 Headache: Secondary | ICD-10-CM

## 2012-07-05 MED ORDER — KETOROLAC TROMETHAMINE 60 MG/2ML IJ SOLN
60.0000 mg | Freq: Once | INTRAMUSCULAR | Status: AC
  Administered 2012-07-05: 60 mg via INTRAMUSCULAR

## 2012-07-05 MED ORDER — SUMATRIPTAN SUCCINATE 100 MG PO TABS: 100.0000 mg | ORAL_TABLET | ORAL | Status: DC | PRN

## 2012-07-05 NOTE — Progress Notes (Addendum)
  Subjective:    Patient ID: [name], female    DOB: 03-08-1981, 32 y.o.   MRN: [id number]  Headache  Episode onset: 3 weeks daily headaches. The problem occurs daily. The problem has been gradually worsening. Pain location: Right frontal lobe. The pain does not radiate. The pain quality is not similar to prior headaches. The quality of the pain is described as pulsating. The pain is at a severity of 6/10. The pain is moderate. Associated symptoms include blurred vision and nausea. Pertinent negatives include no abdominal pain, abnormal behavior, anorexia, back pain, coughing, dizziness, drainage, ear pain, eye pain, eye redness, eye watering, facial sweating, fever, hearing loss, insomnia, loss of balance, muscle aches, neck pain, numbness, phonophobia, photophobia, rhinorrhea, scalp tenderness, seizures, sinus pressure, sore throat, swollen glands, tingling, tinnitus, visual change, vomiting, weakness or weight loss. She has tried NSAIDs for the symptoms. The treatment provided mild relief. Her past medical history is significant for hypertension. There is no history of cancer, cluster headaches, immunosuppression, migraine headaches, migraines in the family, obesity, pseudotumor cerebri, recent head traumas, sinus disease or TMJ.   Patient has a history of HTN. Her Metoprolol was recently increased because of HA's and BP still being elevated. Headaches have not worsened or changed but been constant even after increase in metoprolol.     Review of Systems  Constitutional: Negative for fever and weight loss.  HENT: Negative for hearing loss, ear pain, sore throat, rhinorrhea, neck pain, sinus pressure and tinnitus.   Eyes: Positive for blurred vision. Negative for photophobia, pain and redness.  Respiratory: Negative for cough.   Gastrointestinal: Positive for nausea. Negative for vomiting, abdominal pain and anorexia.  Musculoskeletal: Negative for back pain.  Neurological: Positive for  headaches. Negative for dizziness, tingling, seizures, weakness, numbness and loss of balance.  Psychiatric/Behavioral: The patient does not have insomnia.        Objective:   Physical Exam  Constitutional: She is oriented to person, place, and time. She appears well-developed and well-nourished.  HENT:  Head: Normocephalic and atraumatic.  Right Ear: External ear normal.  Left Ear: External ear normal.  Nose: Nose normal.  Mouth/Throat: Oropharynx is clear and moist. No oropharyngeal exudate.  Eyes: Conjunctivae and EOM are normal. Pupils are equal, round, and reactive to light.  Neck: Normal range of motion. Neck supple.  Cardiovascular: Normal rate, regular rhythm and normal heart sounds.   Pulmonary/Chest: Effort normal and breath sounds normal.  Lymphadenopathy:    She has no cervical adenopathy.  Neurological: She is alert and oriented to person, place, and time.  Skin: Skin is warm and dry.  Psychiatric: She has a normal mood and affect. Her behavior is normal.          Assessment & Plan:  Headache/HTN- dose not sound like typical migraine. Gave Toradol 60mg  in office. Did give Imitrex to try at onset of bad HA's. Pt does not want to increase BB again which was option or start on another BP medication today. Discussed with pt her BP is still consider hypertensive. Not convinced that HA's are coming from BP. Would like for pt to also conisder daily anti-histamines if there may be some allergies involved. Follow up in 1 month for BP recheck and HA's. Call if HA's are worsening. Discussed with pt if BP still not controlled may need to consider another BP med.

## 2012-07-05 NOTE — Patient Instructions (Addendum)
Follow up with Dr. Linford Arnold in 1 month or call if worsening or not improving.   Use Imitrex as needed for HA's.

## 2012-07-05 NOTE — Telephone Encounter (Signed)
Pt gave following information in regards to BRCA testing:  Maternal Aunts:  1st  Dx @ 57 05/2010 2nd Dx @ 40 06/2001  Paternal Aunt:  Dx @ 62 07/2009  .[name]

## 2012-08-12 ENCOUNTER — Ambulatory Visit (INDEPENDENT_AMBULATORY_CARE_PROVIDER_SITE_OTHER): Payer: BC Managed Care – PPO | Admitting: Family Medicine

## 2012-08-12 ENCOUNTER — Encounter: Payer: Self-pay | Admitting: Family Medicine

## 2012-08-12 VITALS — BP 144/88 | HR 81 | Ht 68.0 in | Wt 188.0 lb

## 2012-08-12 DIAGNOSIS — I10 Essential (primary) hypertension: Secondary | ICD-10-CM

## 2012-08-12 DIAGNOSIS — K589 Irritable bowel syndrome without diarrhea: Secondary | ICD-10-CM

## 2012-08-12 MED ORDER — HYDROCHLOROTHIAZIDE 12.5 MG PO CAPS: 12.5000 mg | ORAL_CAPSULE | Freq: Every day | ORAL | Status: DC

## 2012-08-12 NOTE — Progress Notes (Signed)
  Subjective:    Patient ID: [name], female    DOB: Nov 26, 1980, 32 y.o.   MRN: [id number]  HPI HTN -  Pt denies chest pain, SOB, dizziness, or heart palpitations.  Taking meds as directed w/o problems.  Denies medication side effects.  Still some HA but not as frequent and and intense. HA are maybe once a week now and last a few hours.     Review of Systems     Objective:   Physical Exam  Constitutional: She is oriented to person, place, and time. She appears well-developed and well-nourished.  HENT:  Head: Normocephalic and atraumatic.  Cardiovascular: Normal rate, regular rhythm and normal heart sounds.   Pulmonary/Chest: Effort normal and breath sounds normal.  Neurological: She is alert and oriented to person, place, and time.  Skin: Skin is warm and dry.  Psychiatric: She has a normal mood and affect. Her behavior is normal.          Assessment & Plan:  HTN- much improved today but still uncontrolled. We discussed different options. Remind her of low-salt diet as well as regular exercise. This can certainly will pressure out of 10 points. In addition we'll add a low dose of chlorothiazide. Follow back up in 6 weeks. Her blood pressure tends to go back down then we can certainly look at reducing the medication. In the meantime I would like to check kidney function, thyroid to evaluate for other causes of her elevated blood pressure. She is otherwise tolerating metoprolol well. Continue current regimen.  IBS-her IBS has been flaring recently. She is diarrhea predominant. She is currently on Levsin and plans on going back up with her GI because her symptoms have been under poor control recently.

## 2012-09-23 ENCOUNTER — Ambulatory Visit: Payer: BC Managed Care – PPO | Admitting: Family Medicine

## 2012-10-10 IMAGING — US US OB COMP LESS 14 WK
1 series · 14 of 28 positions shown · non-contrast
Comparison: none

[Series 1: us ob comp less 14 wk · 0.19mm/px · 14 of 34 slices shown]
[im 2/34]
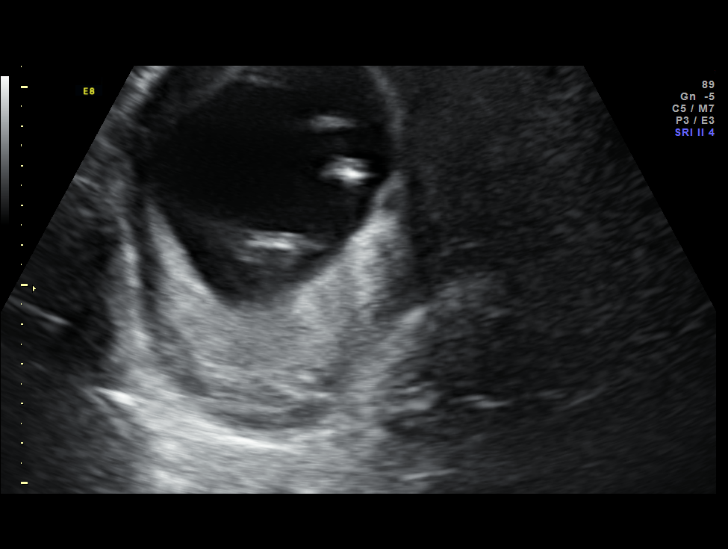
[im 4/34]
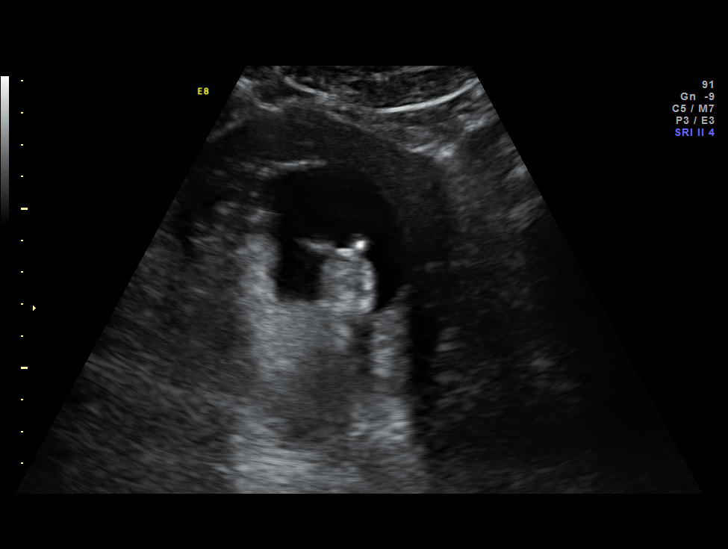
[im 7/34]
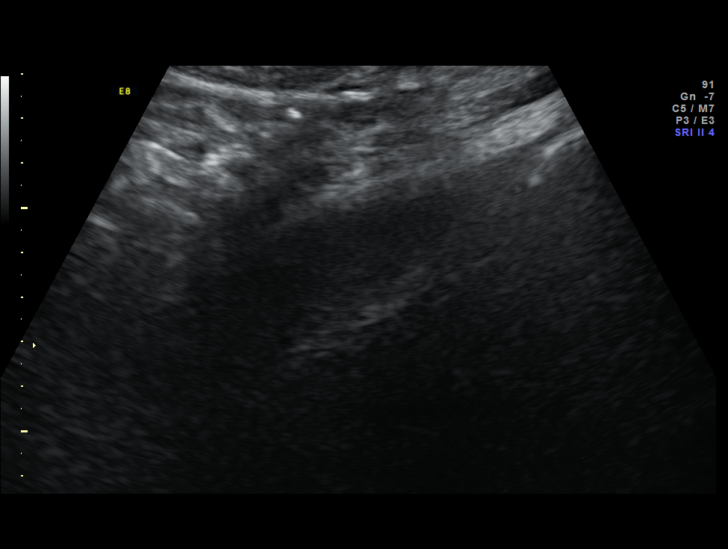
[im 9/34]
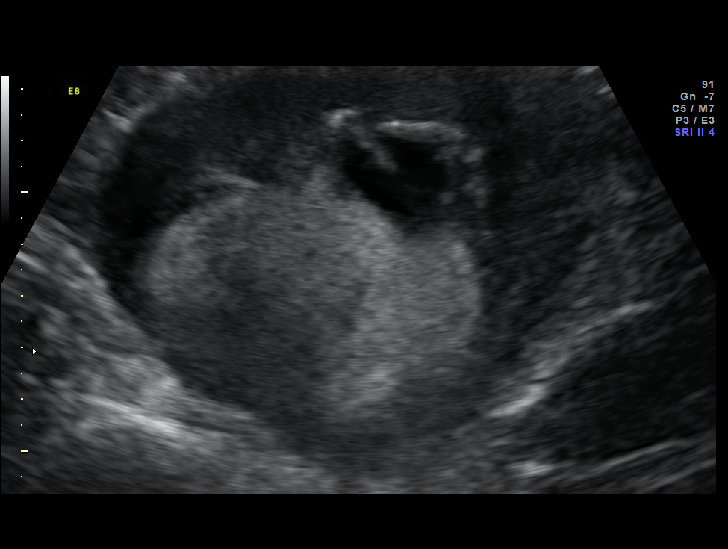
[im 12/34]
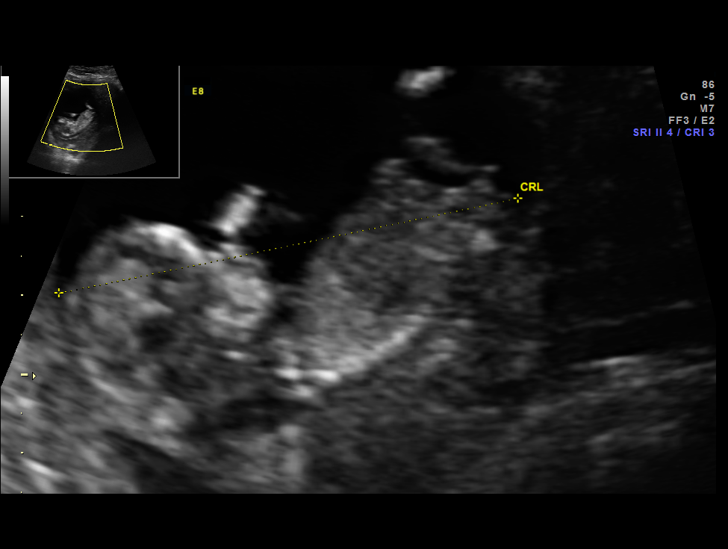
[im 14/34]
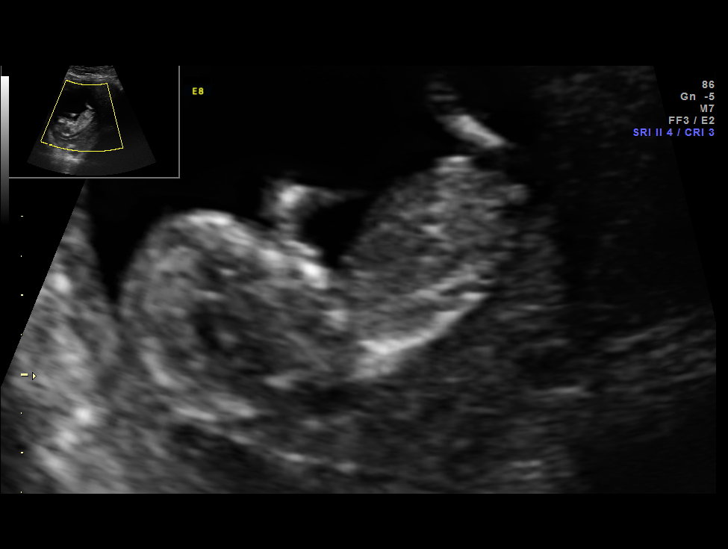
[im 16/34]
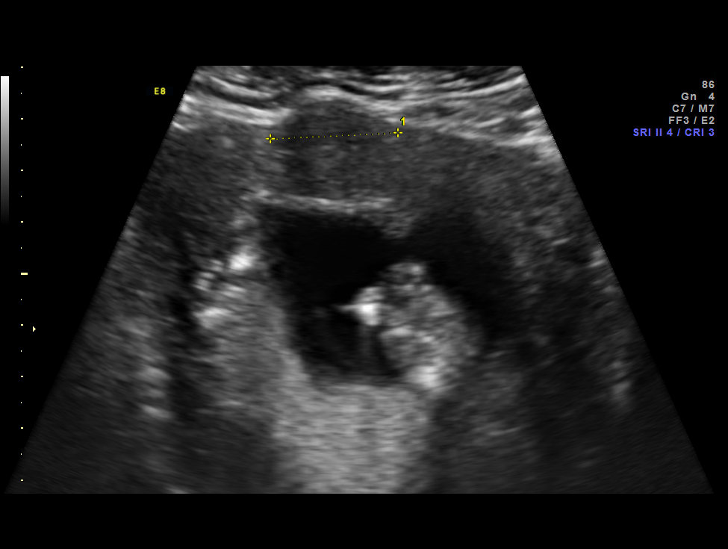
[im 19/34]
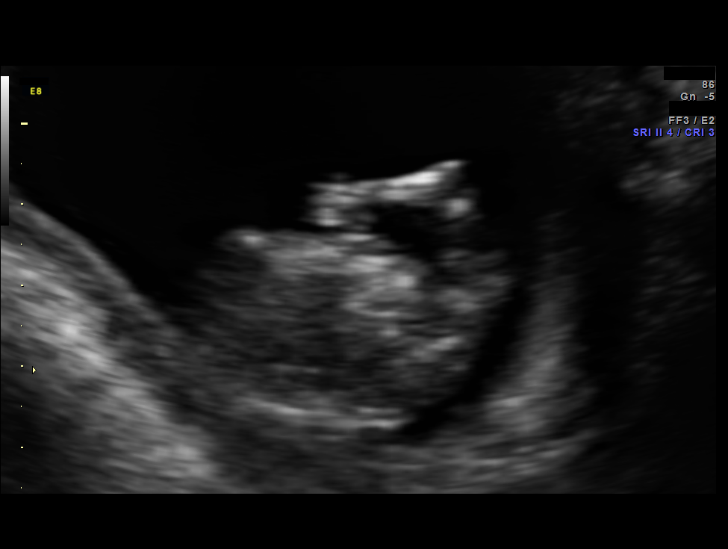
[im 21/34]
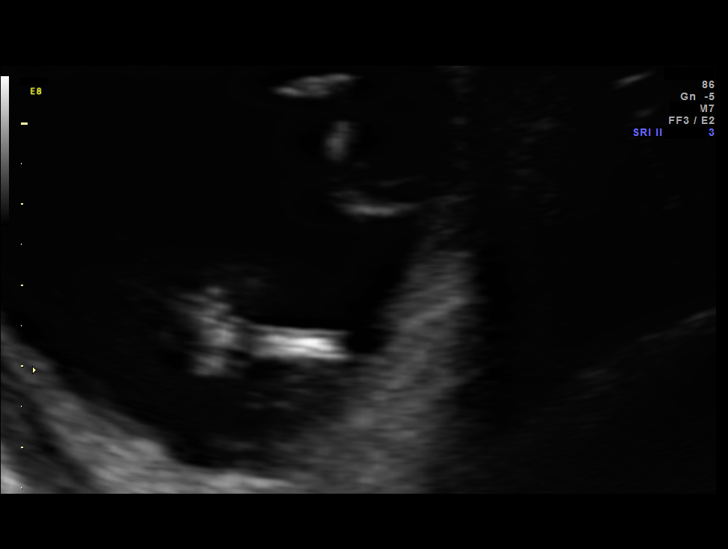
[im 24/34]
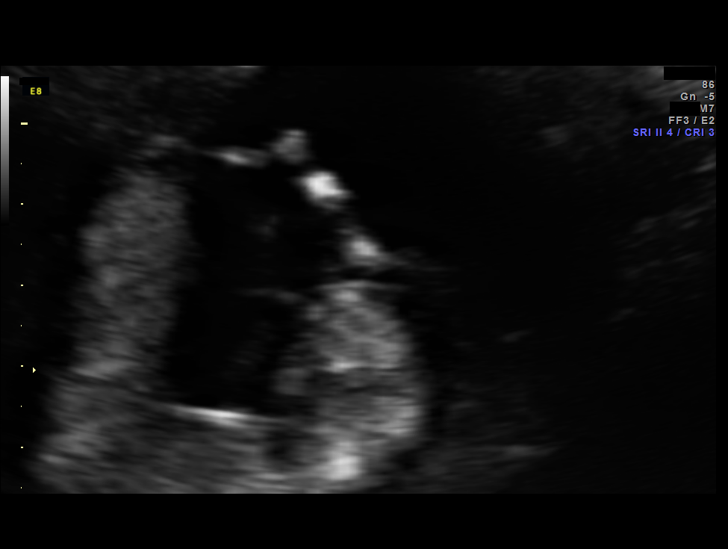
[im 26/34]
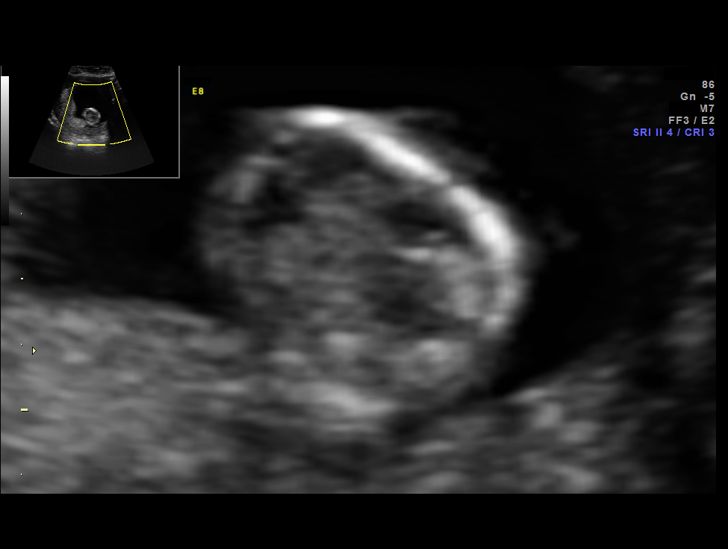
[im 29/34]
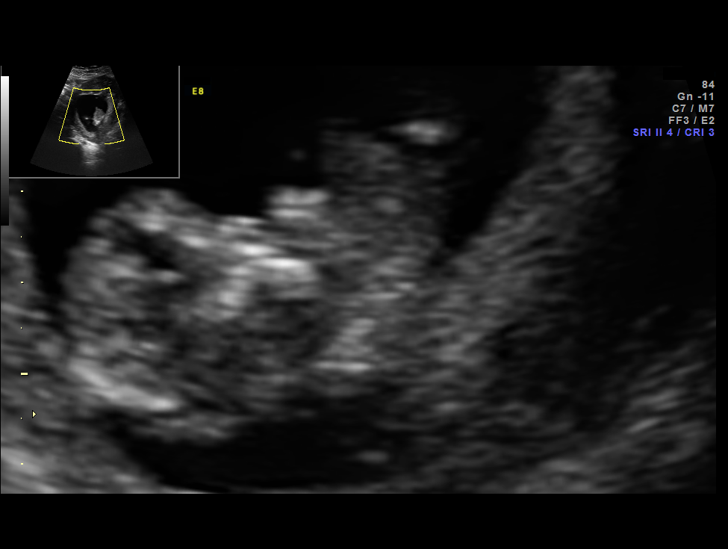
[im 31/34]
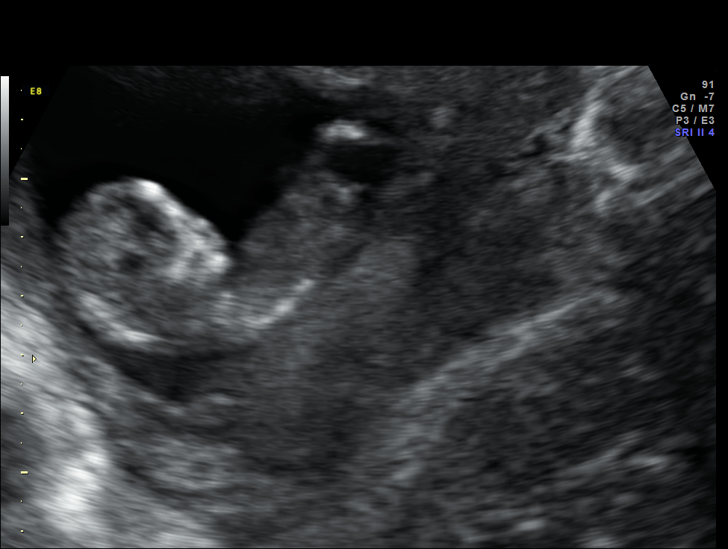
[im 34/34]
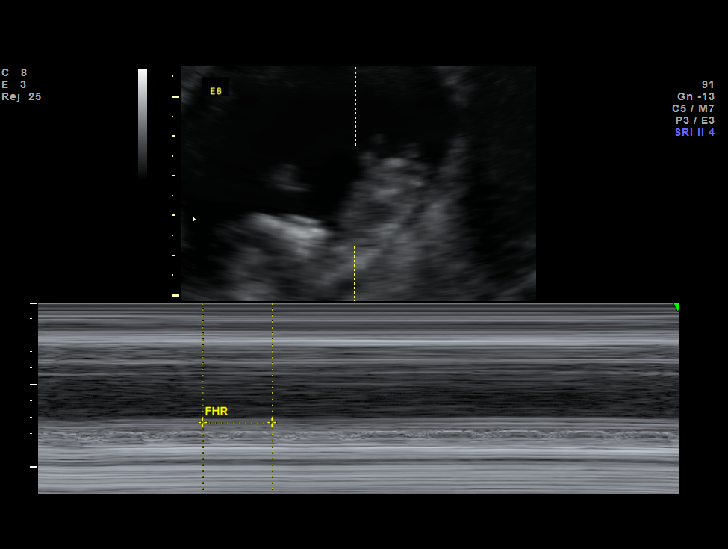

[14 of 28 positions shown; findings below may reference images not displayed]

Canned report from images found in remote index.

Refer to host system for actual result text.

## 2012-10-18 IMAGING — US US OB NUCHAL TRANSLUCENCY 1ST GEST
1 series · 14 of 22 positions shown · non-contrast
Comparison: none

[Series 1: us ob nuchal translucency 1st gest · 0.20mm/px · 14 of 22 slices shown]
[im 1/22]
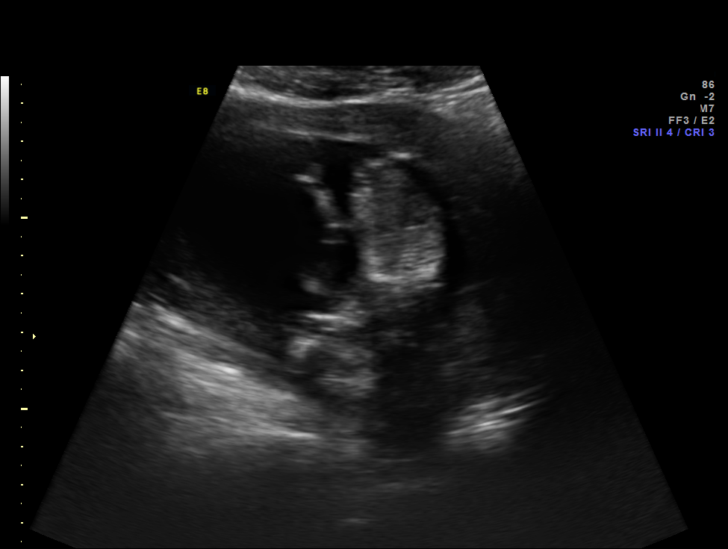
[im 3/22]
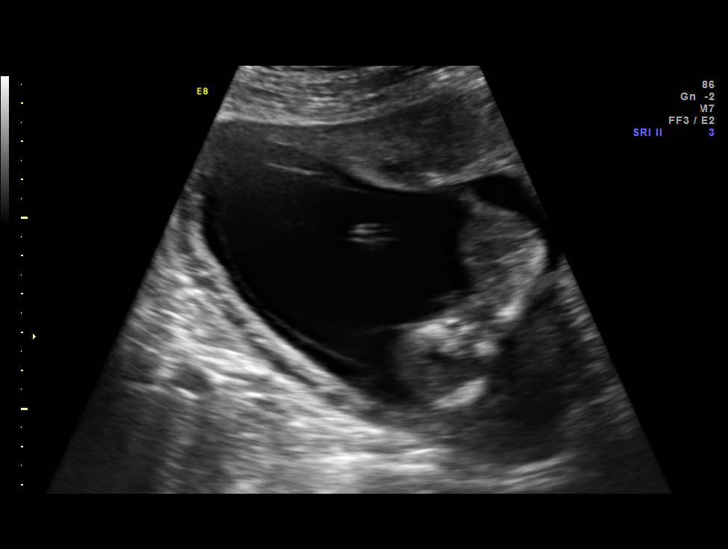
[im 4/22]
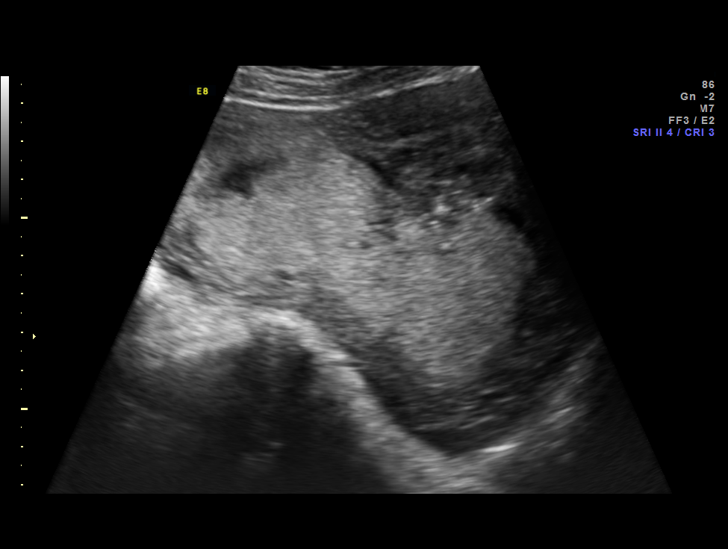
[im 6/22]
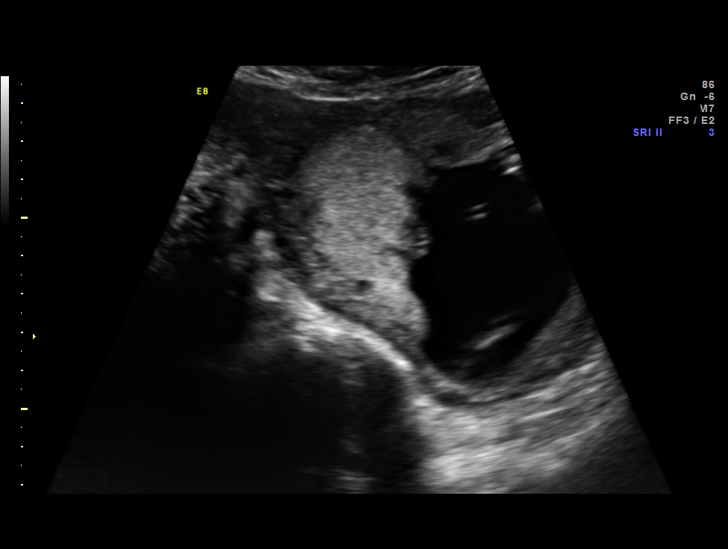
[im 8/22]
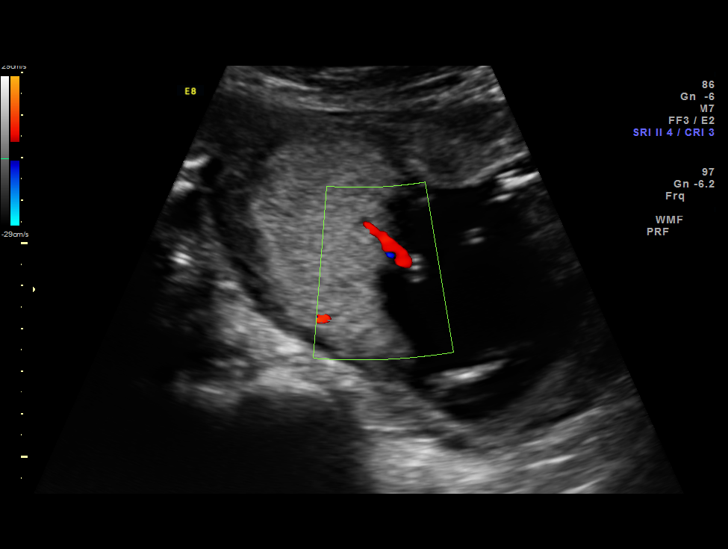
[im 9/22]
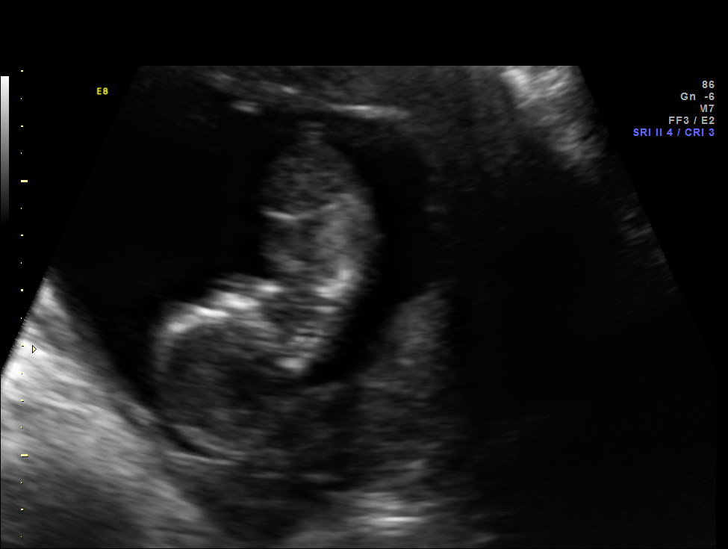
[im 11/22]
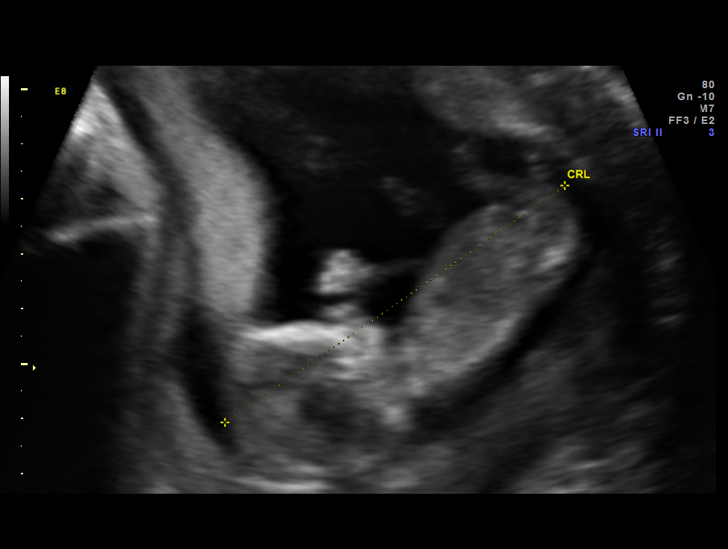
[im 12/22]
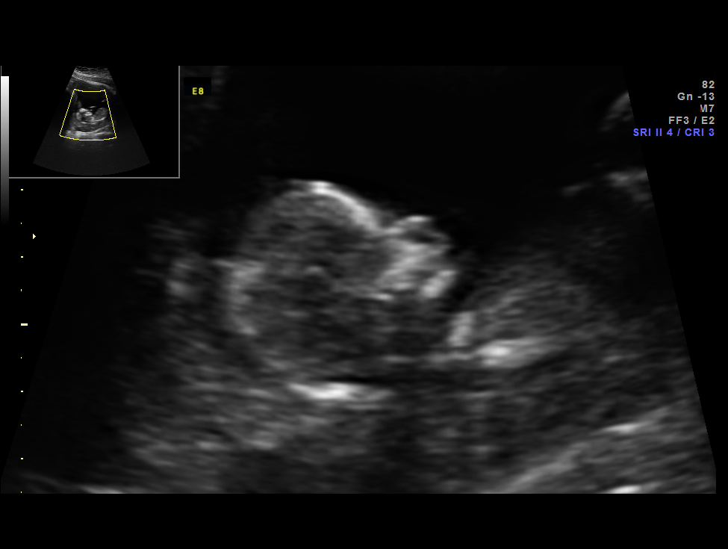
[im 14/22]
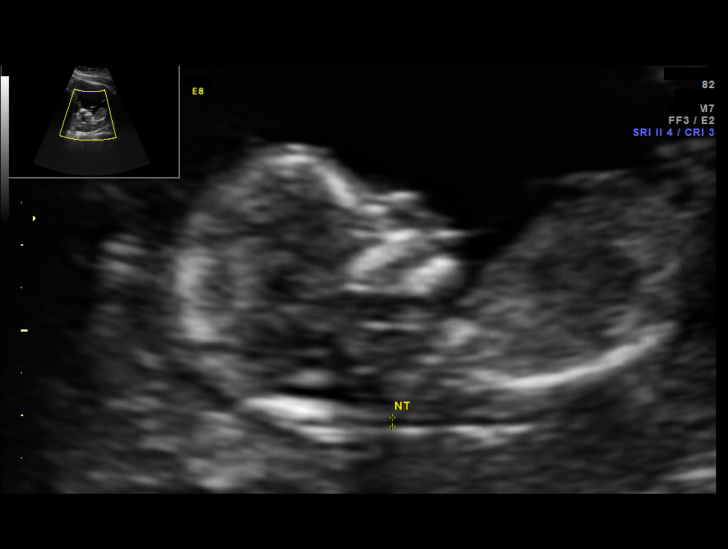
[im 15/22]
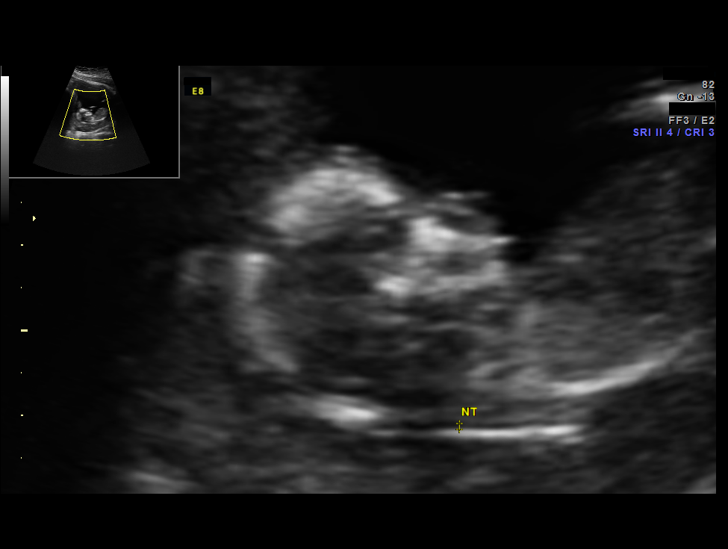
[im 17/22]
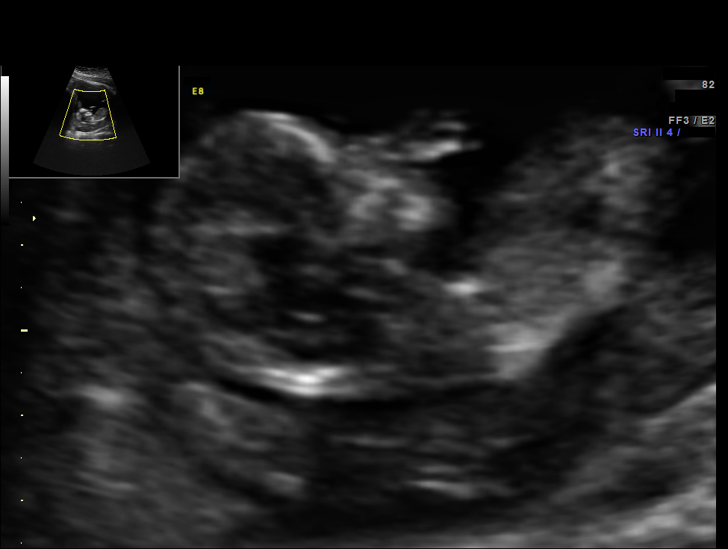
[im 19/22]
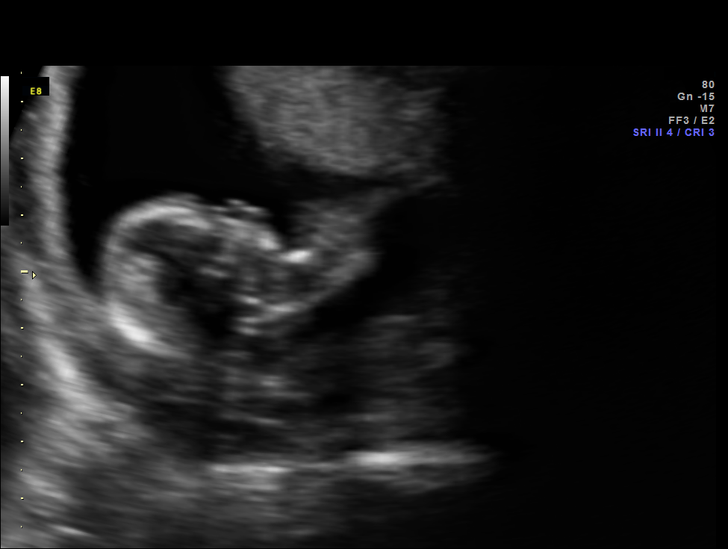
[im 20/22]
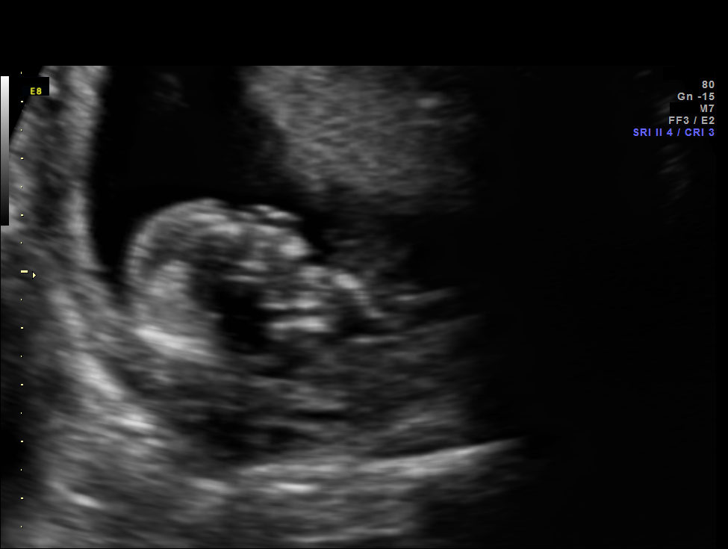
[im 22/22]
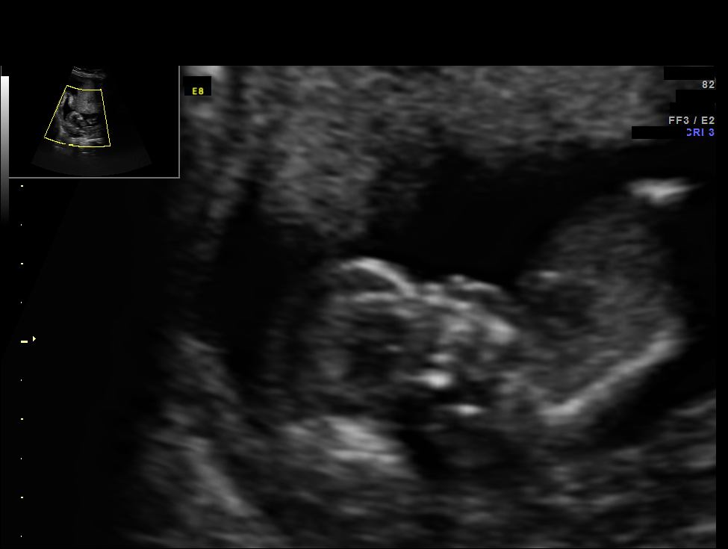

[14 of 22 positions shown; findings below may reference images not displayed]

Canned report from images found in remote index.

Refer to host system for actual result text.

## 2013-01-16 ENCOUNTER — Telehealth: Payer: Self-pay | Admitting: *Deleted

## 2013-01-16 NOTE — Telephone Encounter (Signed)
Pt calls & states that she has never received the results from the BrCa test.  I looked but couldn't find anything.

## 2013-01-16 NOTE — Telephone Encounter (Signed)
Let call the company. I haven't received the results either.

## 2013-01-22 NOTE — Telephone Encounter (Signed)
Went digging through the patient's chart & found where the [facility] test had been canceled due to them being unable to reach the pt for further clinical history info.  Called pt & left vm for her to call me back.

## 2013-01-24 NOTE — Telephone Encounter (Signed)
Patient advised and scheduled for follow up nurse visit.

## 2013-02-11 ENCOUNTER — Encounter: Payer: Self-pay | Admitting: Obstetrics & Gynecology

## 2013-02-11 ENCOUNTER — Encounter: Payer: Self-pay | Admitting: *Deleted

## 2013-02-11 ENCOUNTER — Other Ambulatory Visit: Payer: BC Managed Care – PPO | Admitting: Family Medicine

## 2013-02-11 ENCOUNTER — Ambulatory Visit (INDEPENDENT_AMBULATORY_CARE_PROVIDER_SITE_OTHER): Payer: BC Managed Care – PPO | Admitting: Obstetrics & Gynecology

## 2013-02-11 VITALS — BP 156/99 | HR 94 | Resp 16 | Ht 68.0 in | Wt 180.0 lb

## 2013-02-11 DIAGNOSIS — Z124 Encounter for screening for malignant neoplasm of cervix: Secondary | ICD-10-CM

## 2013-02-11 DIAGNOSIS — N920 Excessive and frequent menstruation with regular cycle: Secondary | ICD-10-CM

## 2013-02-11 DIAGNOSIS — Z1151 Encounter for screening for human papillomavirus (HPV): Secondary | ICD-10-CM

## 2013-02-11 DIAGNOSIS — Z803 Family history of malignant neoplasm of breast: Secondary | ICD-10-CM

## 2013-02-11 DIAGNOSIS — Z01419 Encounter for gynecological examination (general) (routine) without abnormal findings: Secondary | ICD-10-CM

## 2013-02-11 LAB — CBC
Platelets: 315 10*3/uL (ref 150–400)
RDW: 14.2 % (ref 11.5–15.5)
WBC: 4.5 10*3/uL (ref 4.0–10.5)

## 2013-02-11 MED ORDER — VALACYCLOVIR HCL 500 MG PO TABS: 500.0000 mg | ORAL_TABLET | Freq: Every day | ORAL | Status: DC

## 2013-02-11 NOTE — Progress Notes (Signed)
  Subjective:     [name] is a 32 y.o. female here for a routine exam.  Current complaints: heavier periods for the past 5 months.  Pt menses lasts 7 days with 3-4 days of super tampons and pad q 4 hours.  No cramping.  Pt has stained clothes.  Pt also having increased fishy discharge that comes and goes.  Pt is not experiencing this now.  Pt is not taking meds for HTN and is having increase IBS symptoms.Personal health questionnaire reviewed: yes.   Gynecologic History Patient's last menstrual period was 01/18/2013. Contraception: vasectomy Last Pap: 2012. Results were: normal Last mammogram: n/a.  Obstetric History OB History  Gravida Para Term Preterm AB SAB TAB Ectopic Multiple Living  4 2 1 1 2 2    2     # Outcome Date GA Lbr Len/2nd Weight Sex Delivery Anes PTL Lv  4 TRM 10/16/11 [redacted]w[redacted]d  5 lb 8.2 oz (2.5 kg) F LTCS Spinal  Y  3 PRE 02/25/10 [redacted]w[redacted]d  4 lb 6 oz (1.984 kg) F CS Spinal N Y     Comments: [place]; preeclampsia at 33 wks with induction labor  2 SAB 08/31/07 [redacted]w[redacted]d            Comments: D&C  1 SAB 08/23/06 [redacted]w[redacted]d            Comments: D&C       The following portions of the patient's history were reviewed and updated as appropriate: allergies, current medications, past family history, past medical history, past social history, past surgical history and problem list.  Review of Systems Pertinent items are noted in HPI.    Objective:      Filed Vitals:   02/11/13 1403  BP: 156/99  Pulse: 94  Resp: 16  Height: 5\' 8"  (1.727 m)  Weight: 180 lb (81.647 kg)   Vitals:  WNL General appearance: alert, cooperative and no distress Head: Normocephalic, without obvious abnormality, atraumatic Eyes: negative Throat: lips, mucosa, and tongue normal; teeth and gums normal Lungs: clear to auscultation bilaterally Breasts: normal appearance, no masses or tenderness, No nipple retraction or dimpling, No nipple discharge or bleeding Heart: regular rate and rhythm Abdomen:  soft, non-tender; bowel sounds normal; no masses,  no organomegaly Pelvic: cervix normal in appearance, external genitalia normal, no adnexal masses or tenderness, no bladder tenderness, no cervical motion tenderness, perianal skin: no external genital warts noted, urethra without abnormality or discharge, uterus normal size, shape, and consistency and vagina normal without discharge Extremities: no edema, redness or tenderness in the calves or thighs Skin: no lesions or rash Lymph nodes: Axillary adenopathy: none         Assessment:    Healthy female exam.    Plan:    Education reviewed: self breast exams. Contraception: vasectomy. Follow up in: 2 weeks. TVUS TSH Cbc Pap Come to office when vaginal d/c is present Resume HTN meds and follow up with PCP for IBS symptoms.

## 2013-02-11 NOTE — Progress Notes (Unsigned)
Pt comes in today to have brCa testing done.  [employer] called and they will come tomorrow to p/u package conf# Miles [address]Niles Ess, Viann Shove

## 2013-02-11 NOTE — Addendum Note (Signed)
Addended by: Arne Cleveland on: 02/11/2013 02:43 PM   Modules accepted: Orders

## 2013-02-12 ENCOUNTER — Telehealth: Payer: Self-pay | Admitting: *Deleted

## 2013-02-12 LAB — TSH: TSH: 0.411 u[IU]/mL (ref 0.350–4.500)

## 2013-02-12 NOTE — Telephone Encounter (Signed)
Called pt to adv CBC and TSH are WNL - still waiting on PAP - LMOM

## 2013-02-12 NOTE — Telephone Encounter (Signed)
LM on cel lphone that her recent labs were WNL.

## 2013-02-17 ENCOUNTER — Ambulatory Visit (HOSPITAL_COMMUNITY)
Admission: RE | Admit: 2013-02-17 | Discharge: 2013-02-17 | Disposition: A | Discharge status: Home / Self Care | Payer: BC Managed Care – PPO | Source: Ambulatory Visit | Attending: Obstetrics & Gynecology | Admitting: Obstetrics & Gynecology

## 2013-02-17 DIAGNOSIS — D251 Intramural leiomyoma of uterus: Secondary | ICD-10-CM | POA: Insufficient documentation

## 2013-02-17 DIAGNOSIS — N920 Excessive and frequent menstruation with regular cycle: Secondary | ICD-10-CM | POA: Insufficient documentation

## 2013-02-17 DIAGNOSIS — N83209 Unspecified ovarian cyst, unspecified side: Secondary | ICD-10-CM | POA: Insufficient documentation

## 2013-02-24 ENCOUNTER — Telehealth: Payer: Self-pay | Admitting: Family Medicine

## 2013-02-24 NOTE — Telephone Encounter (Signed)
Pt advised on 02/24/13 by Eusebio Me, SCMA.

## 2013-02-24 NOTE — Telephone Encounter (Signed)
Call patient: She is negative for the mutation on the BRCA1 gene and negative for the mutation on the BRCA2 gene. This is fantastic news. She still needs to consider routine screening for mammogram but she is not at increased risk because of a gene mutation.

## 2013-02-25 ENCOUNTER — Encounter: Payer: Self-pay | Admitting: Obstetrics & Gynecology

## 2013-02-25 ENCOUNTER — Ambulatory Visit (INDEPENDENT_AMBULATORY_CARE_PROVIDER_SITE_OTHER): Payer: BC Managed Care – PPO | Admitting: Obstetrics & Gynecology

## 2013-02-25 VITALS — BP 141/92 | HR 81 | Resp 16 | Ht 68.0 in | Wt 180.0 lb

## 2013-02-25 DIAGNOSIS — Z803 Family history of malignant neoplasm of breast: Secondary | ICD-10-CM

## 2013-02-25 DIAGNOSIS — N83209 Unspecified ovarian cyst, unspecified side: Secondary | ICD-10-CM

## 2013-02-25 MED ORDER — IBUPROFEN 600 MG PO TABS: 600.0000 mg | ORAL_TABLET | Freq: Four times a day (QID) | ORAL | Status: DC | PRN

## 2013-02-25 MED ORDER — NORGESTIMATE-ETH ESTRADIOL 0.25-35 MG-MCG PO TABS: 1.0000 | ORAL_TABLET | Freq: Every day | ORAL | Status: DC

## 2013-02-25 NOTE — Progress Notes (Addendum)
  Subjective:    Patient ID: [name], female    DOB: [birth year], 32 y.o.   MRN: [id number]  HPI Pt presents for test results.  Nml Hgb and TSH.   US shows small intramural fibroids and a left probably hemorrhagic cyst.  Pt is not having pain.  She is having heavy menses.  NO evidence of polyp or submucosal fibroid.  Discussed NSAIDS, OCPs, depo, IUD.  Pt opting for NSAIDS.  Pt does have mild HTN (141/92 today without medications).  Pt will see if NSAIDS and weight loss help her menstrual cycle.  If not then she will try OCPs.  Pt will call our office for a BP check is she starts OCPs.  We will also get a rpt [place] for follow up cyst.  BRCA test negative.   Review of Systems  Constitutional: Negative.   Respiratory: Negative.   Cardiovascular: Negative.        Objective:   Physical Exam  Vitals reviewed. Constitutional: She is oriented to person, place, and time. She appears well-developed and well-nourished. No distress.  HENT:  Head: Normocephalic and atraumatic.  Eyes: Conjunctivae are normal.  Pulmonary/Chest: Effort normal.  Neurological: She is alert and oriented to person, place, and time.  Skin: Skin is warm and dry.  Psychiatric: She has a normal mood and affect.          Assessment & Plan:  32 yo female with heavy menses and nml hgb  1-NSAIDS 2-OCPS if not controlled with NSAIDS with subsequent BP check. 3-F/U US; will call with results.  Addendum: F/U US in December shows resolution of cyst.

## 2013-04-08 ENCOUNTER — Ambulatory Visit (INDEPENDENT_AMBULATORY_CARE_PROVIDER_SITE_OTHER): Payer: BC Managed Care – PPO

## 2013-04-08 DIAGNOSIS — D259 Leiomyoma of uterus, unspecified: Secondary | ICD-10-CM

## 2013-04-08 DIAGNOSIS — N83209 Unspecified ovarian cyst, unspecified side: Secondary | ICD-10-CM

## 2013-04-09 ENCOUNTER — Telehealth: Payer: Self-pay | Admitting: *Deleted

## 2013-04-09 NOTE — Telephone Encounter (Signed)
Lm that her recent U/S showed resolved ovarian cyst.

## 2013-04-09 NOTE — Telephone Encounter (Signed)
Message copied by Granville Lewis on Wed Apr 09, 2013  2:41 PM ------      Message from: Lesly Dukes      Created: Tue Apr 08, 2013  5:27 PM       F/U US shows resolution of left ovarian cyst ------

## 2013-06-01 ENCOUNTER — Other Ambulatory Visit: Payer: Self-pay | Admitting: Family Medicine

## 2013-07-08 ENCOUNTER — Other Ambulatory Visit: Payer: Self-pay | Admitting: *Deleted

## 2013-07-08 DIAGNOSIS — B009 Herpesviral infection, unspecified: Secondary | ICD-10-CM

## 2013-07-08 MED ORDER — VALACYCLOVIR HCL 500 MG PO TABS: 500.0000 mg | ORAL_TABLET | Freq: Every day | ORAL | Status: DC

## 2013-07-08 NOTE — Telephone Encounter (Signed)
Pt called stating that for the last 2 months her RX for Valtrex was only given 21 tablets where as the prescription is written for 30 tabs.  RF sent to [facility] for 30 tabs.  Instructed pt to check RX before leaving the store to insure that the quanity was correct.

## 2013-12-26 ENCOUNTER — Emergency Department (INDEPENDENT_AMBULATORY_CARE_PROVIDER_SITE_OTHER): Payer: BC Managed Care – PPO

## 2013-12-26 ENCOUNTER — Emergency Department (INDEPENDENT_AMBULATORY_CARE_PROVIDER_SITE_OTHER)
Admission: EM | Admit: 2013-12-26 | Discharge: 2013-12-26 | Disposition: A | Discharge status: Home / Self Care | Payer: BC Managed Care – PPO | Source: Home / Self Care | Attending: Family Medicine | Admitting: Family Medicine

## 2013-12-26 ENCOUNTER — Encounter: Payer: Self-pay | Admitting: Emergency Medicine

## 2013-12-26 DIAGNOSIS — R590 Localized enlarged lymph nodes: Secondary | ICD-10-CM

## 2013-12-26 DIAGNOSIS — R079 Chest pain, unspecified: Secondary | ICD-10-CM

## 2013-12-26 DIAGNOSIS — R599 Enlarged lymph nodes, unspecified: Secondary | ICD-10-CM

## 2013-12-26 MED ORDER — TUBERCULIN PPD 5 UNIT/0.1ML ID SOLN: 5.0000 [IU] | Freq: Once | INTRADERMAL | Status: DC

## 2013-12-26 NOTE — ED Notes (Signed)
[name] c/o swollen axillary lymph nodes that started 2 weeks ago on the left, resolved then started on the right. Now both are swollen and tender. No changes in breasts. LMP was 2 weeks ago, lasting 5 days.

## 2013-12-26 NOTE — ED Provider Notes (Signed)
CSN: [id number]     Arrival date & time 12/26/13  0821 History   First MD Initiated Contact with Patient 12/26/13 [phone]     Chief Complaint  Patient presents with  . Lymphadenopathy    axillary     HPI Comments: Patient noticed a small tender lymph node in her right axilla about two weeks ago that resolved, and then recurred.  It has gradually enlarged.  About 3 days ago she noticed small tender nodule in her left axilla.  She feels well otherwise.  No fevers, chills, and sweats.  No weight loss.  No insect or tick bites, and no lesions on arms.  No breast tenderness.                                                                                                                                                                                                                                                                                                                                                                           The history is provided by the patient.    Past Medical History  Diagnosis Date  . Fibroids     myomectomy  . IBS (irritable bowel syndrome)   . Herpes genitalia 2008    Last outbreak Nov 2012  . Abnormal Pap smear 2004    colposcopy  . Hypertension     2009  . Pre-eclampsia     2011  . Preeclampsia    Past Surgical History  Procedure Laterality Date  . Dilation and curettage of uterus    . Myomectomy abdominal approach    . Lt breast biopsy    . Dilation and curettage of uterus      x2  . Cesarean section    .  Dilation and curettage of uterus      Miscarriages x 2  . Cesarean section  10/16/2011    Procedure: CESAREAN SECTION;  Surgeon: Mora Bellman, MD;  Location: [facility];  Service: Gynecology;  Laterality: N/A;   Family History  Problem Relation Age of Onset  . Alcohol abuse Father   . Breast cancer Maternal Aunt     2 maternal aunt  . Breast cancer Paternal Aunt   . Hypertension Maternal Aunt   . Hypertension Mother   . Heart attack Paternal Uncle      2 uncles  . Stroke Maternal Grandmother     TIA's  . Colon cancer Paternal Grandmother   . Stomach cancer Paternal Grandmother    History  Substance Use Topics  . Smoking status: Never Smoker   . Smokeless tobacco: Never Used  . Alcohol Use: Yes     Comment: rarely   OB History   Grav Para Term Preterm Abortions TAB SAB Ect Mult Living   4 2 1 1 2  2   2      Review of Systems  Constitutional: Negative for fever, chills, activity change, appetite change, fatigue and unexpected weight change.  HENT: Negative.   Eyes: Negative.   Respiratory: Negative for cough, shortness of breath and wheezing.   Cardiovascular: Negative.   Gastrointestinal: Negative.   Genitourinary: Negative.   Musculoskeletal: Negative for arthralgias, back pain, joint swelling, myalgias, neck pain and neck stiffness.  Skin: Negative.   Neurological: Negative.   Hematological: Positive for adenopathy. Does not bruise/bleed easily.    Allergies  Guaifenesin & derivatives  Home Medications   Prior to Admission medications   Medication Sig Start Date End Date Taking? Authorizing Provider  hydrochlorothiazide (MICROZIDE) 12.5 MG capsule TAKE ONE CAPSULE BY MOUTH ONCE DAILY 06/01/13   Hali Marry, MD  hyoscyamine (LEVSIN SL) 0.125 MG SL tablet Place 1 tablet (0.125 mg total) under the tongue every 4 (four) hours as needed for cramping or diarrhea or loose stools. 05/27/12   Hali Marry, MD  ibuprofen (ADVIL,MOTRIN) 600 MG tablet Take 1 tablet (600 mg total) by mouth every 6 (six) hours as needed. 02/25/13   Guss Bunde, MD  metoprolol succinate (TOPROL-XL) 100 MG 24 hr tablet Take 1 tablet (100 mg total) by mouth daily. Take with or immediately following a meal. 05/31/12 05/31/13  Hali Marry, MD  norgestimate-ethinyl estradiol (ORTHO-CYCLEN,SPRINTEC,PREVIFEM) 0.25-35 MG-MCG tablet Take 1 tablet by mouth daily. 02/25/13   Guss Bunde, MD  triamcinolone cream (KENALOG) 0.1 % APPLY  CREAM TO SKIN TO AFFECTED AREA TWICE DAILY 06/01/13   Hali Marry, MD  valACYclovir (VALTREX) 500 MG tablet Take 1 tablet (500 mg total) by mouth daily. X 3 day prn breakout. 07/08/13   Guss Bunde, MD   BP 148/99  Pulse 97  Temp(Src) 99.3 F (37.4 C) (Oral)  Resp 16  Wt 167 lb (75.751 kg)  SpO2 100%  LMP 12/10/2013 Physical Exam  Nursing note and vitals reviewed. Constitutional: She is oriented to person, place, and time. She appears well-developed and well-nourished. No distress.  HENT:  Head: Normocephalic.  Nose: Nose normal.  Mouth/Throat: Oropharynx is clear and moist. No oropharyngeal exudate.  Eyes: Conjunctivae and EOM are normal. Pupils are equal, round, and reactive to light. Right eye exhibits no discharge. Left eye exhibits no discharge.  Neck: Normal range of motion. Neck supple.  Cardiovascular: Normal heart sounds.   Pulmonary/Chest: Breath sounds normal.  Abdominal:  She exhibits no mass. There is no tenderness. There is no guarding.  Musculoskeletal: She exhibits no edema.  Lymphadenopathy:       Head (right side): No submental, no submandibular, no tonsillar, no preauricular, no posterior auricular and no occipital adenopathy present.       Head (left side): No submental, no submandibular, no tonsillar, no preauricular, no posterior auricular and no occipital adenopathy present.    She has no cervical adenopathy.    She has axillary adenopathy.       Right axillary: Lateral adenopathy present.       Left axillary: Lateral adenopathy present.       Right: No supraclavicular and no epitrochlear adenopathy present.       Left: No supraclavicular and no epitrochlear adenopathy present.  Right axilla reveals a 3cm diameter mobile, soft, tender node without overlying erythema, swelling, or warmth. Left axilla reveals a soft, mobile, tender shotty node in similar location.  Neurological: She is alert and oriented to person, place, and time.  Skin: Skin is warm  and dry. No rash noted.    ED Course  Procedures  None   Labs Reviewed  HIV ANTIBODY (ROUTINE TESTING)  RPR  MONONUCLEOSIS SCREEN  ANA  CBC    Imaging Review Dg Chest 2 View  12/26/2013   CLINICAL DATA:  Bilateral axillary lymphadenopathy.  EXAM: CHEST  2 VIEW  COMPARISON:  PA and lateral chest of July 05, 2004  FINDINGS: The lungs are well-expanded and clear. The heart and pulmonary vascularity are normal. There is no mediastinal nor hilar soft tissue mass. There is no pleural effusion. There is no acute bony abnormality. There is gentle stable curvature of the mid thoracic spine with the convexity toward the right.  IMPRESSION: There is no mediastinal or hilar lymphadenopathy nor other acute cardiopulmonary abnormality. Mild hyperinflation is stable and may be voluntary or could reflect underlying reactive airway disease or COPD.   Electronically Signed   By: David  Martinique   On: 12/26/2013 09:40     MDM   1. Axillary adenopathy; probably benign     Pending labs:  CBC, HIV antibodies, RPR, Monospot, and ANA.  Return for PPD. Followup with Family Doctor if not improved in about three weeks.     Kandra Nicolas, MD 12/26/13 [phone]

## 2013-12-26 NOTE — ED Notes (Signed)
Patient will be out of town during window for PPD read if placed today. Will come back 12/30/13 for PPD.

## 2013-12-26 NOTE — Discharge Instructions (Signed)
Return for PPD.   Lymphadenopathy Lymphadenopathy means "disease of the lymph glands." But the term is usually used to describe swollen or enlarged lymph glands, also called lymph nodes. These are the bean-shaped organs found in many locations including the neck, underarm, and groin. Lymph glands are part of the immune system, which fights infections in your body. Lymphadenopathy can occur in just one area of the body, such as the neck, or it can be generalized, with lymph node enlargement in several areas. The nodes found in the neck are the most common sites of lymphadenopathy. CAUSES When your immune system responds to germs (such as viruses or bacteria ), infection-fighting cells and fluid build up. This causes the glands to grow in size. Usually, this is not something to worry about. Sometimes, the glands themselves can become infected and inflamed. This is called lymphadenitis. Enlarged lymph nodes can be caused by many diseases:  Bacterial disease, such as strep throat or a skin infection.  Viral disease, such as a common cold.  Other germs, such as Lyme disease, tuberculosis, or sexually transmitted diseases.  Cancers, such as lymphoma (cancer of the lymphatic system) or leukemia (cancer of the white blood cells).  Inflammatory diseases such as lupus or rheumatoid arthritis.  Reactions to medications. Many of the diseases above are rare, but important. This is why you should see your caregiver if you have lymphadenopathy. SYMPTOMS  Swollen, enlarged lumps in the neck, back of the head, or other locations.  Tenderness.  Warmth or redness of the skin over the lymph nodes.  Fever. DIAGNOSIS Enlarged lymph nodes are often near the source of infection. They can help health care providers diagnose your illness. For instance:  Swollen lymph nodes around the jaw might be caused by an infection in the mouth.  Enlarged glands in the neck often signal a throat infection.  Lymph  nodes that are swollen in more than one area often indicate an illness caused by a virus. Your caregiver will likely know what is causing your lymphadenopathy after listening to your history and examining you. Blood tests, x-rays, or other tests may be needed. If the cause of the enlarged lymph node cannot be found, and it does not go away by itself, then a biopsy may be needed. Your caregiver will discuss this with you. TREATMENT Treatment for your enlarged lymph nodes will depend on the cause. Many times the nodes will shrink to normal size by themselves, with no treatment. Antibiotics or other medicines may be needed for infection. Only take over-the-counter or prescription medicines for pain, discomfort, or fever as directed by your caregiver. HOME CARE INSTRUCTIONS Swollen lymph glands usually return to normal when the underlying medical condition goes away. If they persist, contact your health-care provider. He/she might prescribe antibiotics or other treatments, depending on the diagnosis. Take any medications exactly as prescribed. Keep any follow-up appointments made to check on the condition of your enlarged nodes. SEEK MEDICAL CARE IF:  Swelling lasts for more than two weeks.  You have symptoms such as weight loss, night sweats, fatigue, or fever that does not go away.  The lymph nodes are hard, seem fixed to the skin, or are growing rapidly.  Skin over the lymph nodes is red and inflamed. This could mean there is an infection. SEEK IMMEDIATE MEDICAL CARE IF:  Fluid starts leaking from the area of the enlarged lymph node.  You develop a fever of 102 F (38.9 C) or greater.  Severe pain develops (not necessarily  at the site of a large lymph node).  You develop chest pain or shortness of breath.  You develop worsening abdominal pain. MAKE SURE YOU:  Understand these instructions.  Will watch your condition.  Will get help right away if you are not doing well or get  worse. Document Released: 01/18/2008 Document Revised: 08/25/2013 Document Reviewed: 01/18/2008 [facility] Patient Information 2015 [place], [place]. This information is not intended to replace advice given to you by your health care provider. Make sure you discuss any questions you have with your health care provider.

## 2013-12-27 LAB — CBC
HEMATOCRIT: 38.5 % (ref 36.0–46.0)
HEMOGLOBIN: 12.6 g/dL (ref 12.0–15.0)
MCH: 28.8 pg (ref 26.0–34.0)
MCHC: 32.7 g/dL (ref 30.0–36.0)
MCV: 88.1 fL (ref 78.0–100.0)
Platelets: 304 10*3/uL (ref 150–400)
RBC: 4.37 MIL/uL (ref 3.87–5.11)
RDW: 14 % (ref 11.5–15.5)
WBC: 2.9 10*3/uL — ABNORMAL LOW (ref 4.0–10.5)

## 2013-12-27 LAB — RPR

## 2013-12-27 LAB — MONONUCLEOSIS SCREEN: Mono Screen: NEGATIVE

## 2013-12-27 LAB — HIV ANTIBODY (ROUTINE TESTING W REFLEX): HIV: NONREACTIVE

## 2013-12-28 ENCOUNTER — Telehealth: Payer: Self-pay | Admitting: *Deleted

## 2013-12-30 LAB — ANA: Anti Nuclear Antibody(ANA): NEGATIVE

## 2014-02-23 ENCOUNTER — Encounter: Payer: Self-pay | Admitting: Emergency Medicine

## 2014-03-30 ENCOUNTER — Ambulatory Visit (INDEPENDENT_AMBULATORY_CARE_PROVIDER_SITE_OTHER): Payer: BC Managed Care – PPO | Admitting: Physician Assistant

## 2014-03-30 ENCOUNTER — Encounter: Payer: Self-pay | Admitting: Physician Assistant

## 2014-03-30 VITALS — BP 141/82 | HR 77 | Ht 68.0 in | Wt 168.0 lb

## 2014-03-30 DIAGNOSIS — J208 Acute bronchitis due to other specified organisms: Secondary | ICD-10-CM | POA: Diagnosis not present

## 2014-03-30 DIAGNOSIS — J0100 Acute maxillary sinusitis, unspecified: Secondary | ICD-10-CM | POA: Diagnosis not present

## 2014-03-30 MED ORDER — HYDROCODONE-HOMATROPINE 5-1.5 MG/5ML PO SYRP: 5.0000 mL | ORAL_SOLUTION | Freq: Every evening | ORAL | Status: DC | PRN

## 2014-03-30 MED ORDER — AZITHROMYCIN 250 MG PO TABS: ORAL_TABLET | ORAL | Status: DC

## 2014-03-30 NOTE — Progress Notes (Signed)
   Subjective:    Patient ID: [name], female    DOB: 06-14-1980, 33 y.o.   MRN: [id number]  HPI  Patient presents to the clinic with 3.5 weeks of URI symptoms. She continues to cough with some production. She has sinus pressure and congestion. Tried robatussin, coricedin, mucinex, and flonase with little relief. No fever, chills, body aches. All her children have been sick but they have improved.     Review of Systems  All other systems reviewed and are negative.      Objective:   Physical Exam  Constitutional: She is oriented to person, place, and time. She appears well-developed and well-nourished.  HENT:  Head: Normocephalic and atraumatic.  Right Ear: External ear normal.  Left Ear: External ear normal.  Nose: Nose normal.  Mouth/Throat: Oropharynx is clear and moist. No oropharyngeal exudate.  Eyes: Conjunctivae are normal. Right eye exhibits no discharge. Left eye exhibits no discharge.  Neck: Normal range of motion. Neck supple.  Cardiovascular: Normal rate, regular rhythm and normal heart sounds.   Lymphadenopathy:    She has no cervical adenopathy.  Neurological: She is alert and oriented to person, place, and time.  Psychiatric: She has a normal mood and affect. Her behavior is normal.          Assessment & Plan:  Acute maxillary sinusitis/cough- treated with zpak and hycodan at bedtime. Gave HO. Discussed other symptomatic care. Consider continuing flonase. Follow up as needed.

## 2014-03-30 NOTE — Progress Notes (Deleted)
   Subjective:    Patient ID: [name], female    DOB: [birth year], 33 y.o.   MRN: [id number]  HPI robatussin and coricden. Cough bronchitis mucus coming up. No fever. no   Review of Systems     Objective:   Physical Exam        Assessment & Plan:

## 2014-05-03 ENCOUNTER — Other Ambulatory Visit: Payer: Self-pay | Admitting: Family Medicine

## 2014-05-26 ENCOUNTER — Telehealth: Payer: Self-pay | Admitting: Emergency Medicine

## 2014-05-26 ENCOUNTER — Other Ambulatory Visit: Payer: Self-pay | Admitting: Physician Assistant

## 2014-05-26 MED ORDER — IBUPROFEN 800 MG PO TABS: 800.0000 mg | ORAL_TABLET | Freq: Three times a day (TID) | ORAL | Status: DC | PRN

## 2014-05-26 NOTE — Telephone Encounter (Signed)
Ok and sent to pharmacy.

## 2014-05-26 NOTE — Telephone Encounter (Signed)
Patient requests a rx for ibuprofen 800 mg to help with dysmennorhea; her GYN said she could call us for that instead of putting her on OCP for this problem; pt tried sample and feels it is effective.

## 2014-05-27 ENCOUNTER — Telehealth: Payer: Self-pay | Admitting: *Deleted

## 2014-05-27 NOTE — Telephone Encounter (Signed)
Returning call to patient from yesterday.  LM for patient to call office.

## 2014-05-28 NOTE — Telephone Encounter (Signed)
Medication sent to pharmacy  

## 2014-07-09 ENCOUNTER — Encounter: Payer: Self-pay | Admitting: Obstetrics & Gynecology

## 2014-07-09 ENCOUNTER — Ambulatory Visit (INDEPENDENT_AMBULATORY_CARE_PROVIDER_SITE_OTHER): Payer: BLUE CROSS/BLUE SHIELD | Admitting: Obstetrics & Gynecology

## 2014-07-09 VITALS — BP 138/82 | HR 88 | Resp 16 | Ht 68.0 in | Wt 167.0 lb

## 2014-07-09 DIAGNOSIS — Z1151 Encounter for screening for human papillomavirus (HPV): Secondary | ICD-10-CM

## 2014-07-09 DIAGNOSIS — Z124 Encounter for screening for malignant neoplasm of cervix: Secondary | ICD-10-CM

## 2014-07-09 DIAGNOSIS — Z01419 Encounter for gynecological examination (general) (routine) without abnormal findings: Secondary | ICD-10-CM | POA: Diagnosis not present

## 2014-07-09 NOTE — Progress Notes (Signed)
Subjective:    [name] is a 34 y.o. separated AAA P2 (30 and 30 yo kids) female who presents for an annual exam. The patient has no complaints today. The patient is not currently sexually active. GYN screening history: last pap: was normal. The patient wears seatbelts: yes. The patient participates in regular exercise: yes. Has the patient ever been transfused or tattooed?: no. The patient reports that there is not domestic violence in her life.   Menstrual History: OB History    Gravida Para Term Preterm AB TAB SAB Ectopic Multiple Living   4 2 1 1 2  2   2       Menarche age: 79  Patient's last menstrual period was 06/22/2014.    The following portions of the patient's history were reviewed and updated as appropriate: allergies, current medications, past family history, past medical history, past social history, past surgical history and problem list.  Review of Systems A comprehensive review of systems was negative.  Working at [employer] (retirement community). Lost 30# by diet and exercise. Already had a flu vaccine.   Objective:    BP 138/82 mmHg  Pulse 88  Resp 16  Ht 5\' 8"  (1.727 m)  Wt 167 lb (75.751 kg)  BMI 25.40 kg/m2  LMP 06/22/2014  General Appearance:    Alert, cooperative, no distress, appears stated age  Head:    Normocephalic, without obvious abnormality, atraumatic  Eyes:    PERRL, conjunctiva/corneas clear, EOM's intact, fundi    benign, both eyes  Ears:    Normal TM's and external ear canals, both ears  Nose:   Nares normal, septum midline, mucosa normal, no drainage    or sinus tenderness  Throat:   Lips, mucosa, and tongue normal; teeth and gums normal  Neck:   Supple, symmetrical, trachea midline, no adenopathy;    thyroid:  no enlargement/tenderness/nodules; no carotid   bruit or JVD  Back:     Symmetric, no curvature, ROM normal, no CVA tenderness  Lungs:     Clear to auscultation bilaterally, respirations unlabored  Chest Wall:    No tenderness  or deformity   Heart:    Regular rate and rhythm, S1 and S2 normal, no murmur, rub   or gallop  Breast Exam:    No tenderness, masses, or nipple abnormality  Abdomen:     Soft, non-tender, bowel sounds active all four quadrants,    no masses, no organomegaly  Genitalia:    Normal female without lesion, discharge or tenderness, ULN size, RV, NT, mobile, normal adnexal exam     Extremities:   Extremities normal, atraumatic, no cyanosis or edema  Pulses:   2+ and symmetric all extremities  Skin:   Skin color, texture, turgor normal, no rashes or lesions  Lymph nodes:   Cervical, supraclavicular, and axillary nodes normal  Neurologic:   CNII-XII intact, normal strength, sensation and reflexes    throughout  .    Assessment:    Healthy female exam.    Plan:     Breast self exam technique reviewed and patient encouraged to perform self-exam monthly. Thin prep Pap smear. with cotesting

## 2014-07-14 ENCOUNTER — Other Ambulatory Visit: Payer: Self-pay | Admitting: *Deleted

## 2014-07-14 DIAGNOSIS — B009 Herpesviral infection, unspecified: Secondary | ICD-10-CM

## 2014-07-14 LAB — CYTOLOGY - PAP

## 2014-07-14 MED ORDER — IBUPROFEN 800 MG PO TABS: 800.0000 mg | ORAL_TABLET | Freq: Three times a day (TID) | ORAL | Status: DC | PRN

## 2014-07-14 MED ORDER — VALACYCLOVIR HCL 500 MG PO TABS: 500.0000 mg | ORAL_TABLET | Freq: Every day | ORAL | Status: DC

## 2014-07-14 NOTE — Telephone Encounter (Signed)
Pt called requesting that her Valtrex and Ibuprofen be sent to [facility] mail order pharmacy.

## 2014-07-27 ENCOUNTER — Encounter: Payer: Self-pay | Admitting: Family Medicine

## 2014-07-27 ENCOUNTER — Ambulatory Visit (INDEPENDENT_AMBULATORY_CARE_PROVIDER_SITE_OTHER): Payer: BLUE CROSS/BLUE SHIELD | Admitting: Family Medicine

## 2014-07-27 VITALS — BP 133/86 | HR 82 | Ht 68.0 in | Wt 169.0 lb

## 2014-07-27 DIAGNOSIS — J309 Allergic rhinitis, unspecified: Secondary | ICD-10-CM | POA: Diagnosis not present

## 2014-07-27 DIAGNOSIS — I10 Essential (primary) hypertension: Secondary | ICD-10-CM

## 2014-07-27 MED ORDER — METOPROLOL SUCCINATE ER 50 MG PO TB24: 50.0000 mg | ORAL_TABLET | Freq: Every day | ORAL | Status: DC

## 2014-07-27 MED ORDER — HYDROCHLOROTHIAZIDE 12.5 MG PO CAPS: 12.5000 mg | ORAL_CAPSULE | Freq: Every day | ORAL | Status: DC

## 2014-07-27 NOTE — Patient Instructions (Signed)
Zyrtec at bedtime for allergies.

## 2014-07-27 NOTE — Progress Notes (Signed)
   Subjective:    Patient ID: [name], female    DOB: [birth year], 34 y.o.   MRN: [id number]  HPI Hypertension- Pt denies chest pain, SOB, dizziness, or heart palpitations.  Taking meds as directed w/o problems.  Denies medication side effects.    Wake up 2 days ago with ST and nasal congestion.  No fever, chills or sweats.  Says using some old cough med at bedtime.    Review of Systems     Objective:   Physical Exam  Constitutional: She is oriented to person, place, and time. She appears well-developed and well-nourished.  HENT:  Head: Normocephalic and atraumatic.  Cardiovascular: Normal rate, regular rhythm and normal heart sounds.   Pulmonary/Chest: Effort normal and breath sounds normal.  Neurological: She is alert and oriented to person, place, and time.  Skin: Skin is warm and dry.  Psychiatric: She has a normal mood and affect. Her behavior is normal.          Assessment & Plan:  HTN - well controlled. Continue current regimen. F/U in 6 months.   AR - Call if not better in 1 week.  Consider tx with antibiotic if not better or sooner if developed fever.

## 2014-08-04 IMAGING — US US PELVIS COMPLETE
1 series · 13 of 25 positions shown · non-contrast
Comparison: 02/02/2011

CLINICAL DATA: Bleeding



[Series 1: us pelvis complete · 13 of 68 slices shown]
[im 1/68]
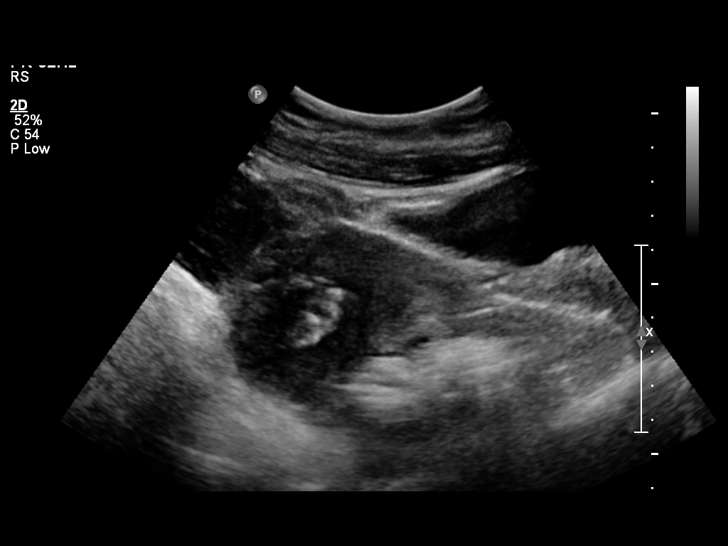
[im 6/68]
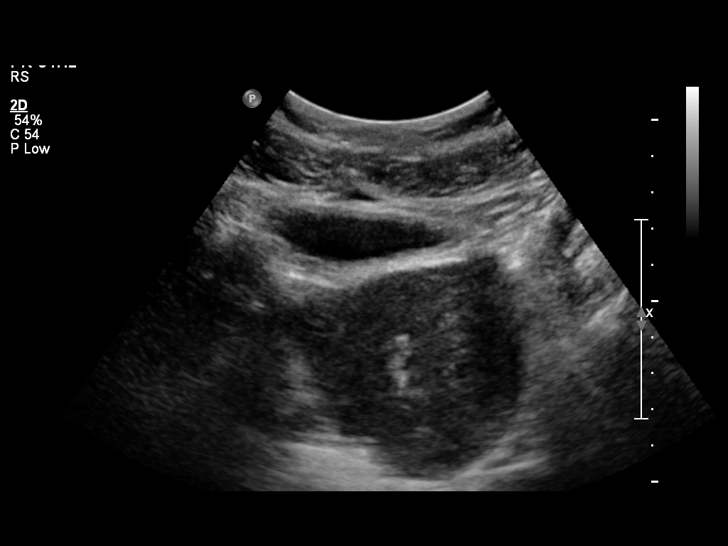
[im 12/68]
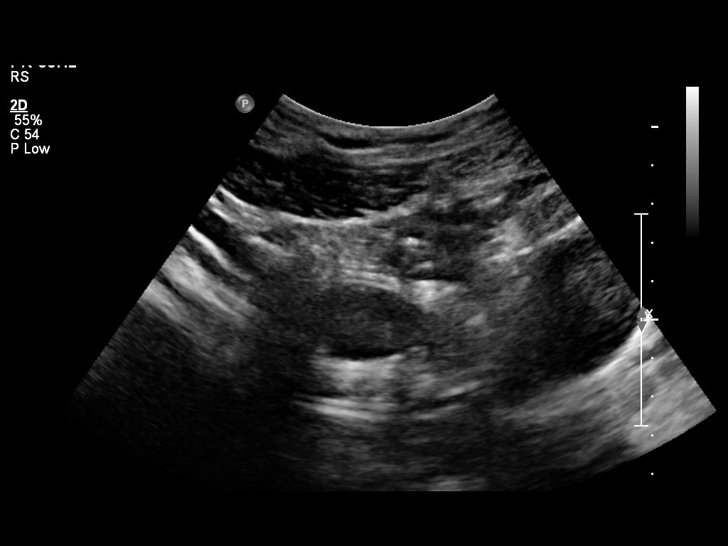
[im 17/68]
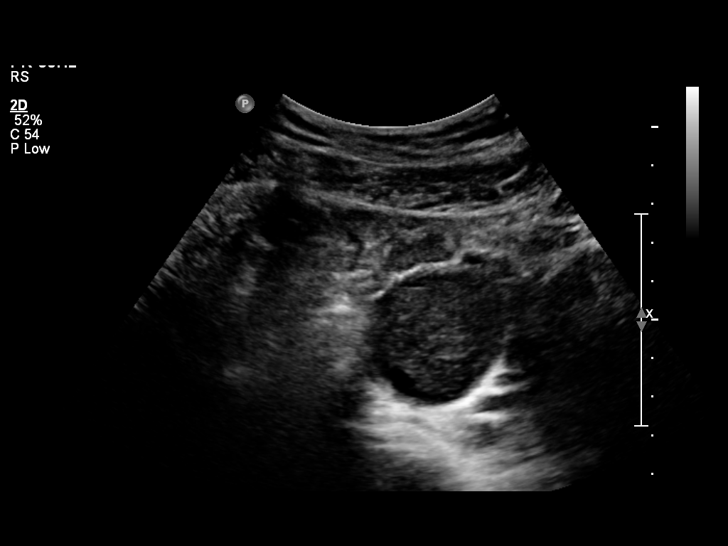
[im 23/68]
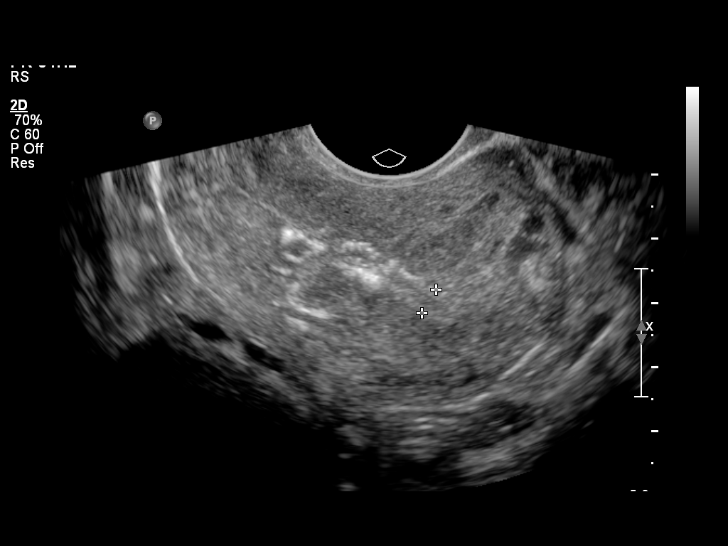
[im 28/68]
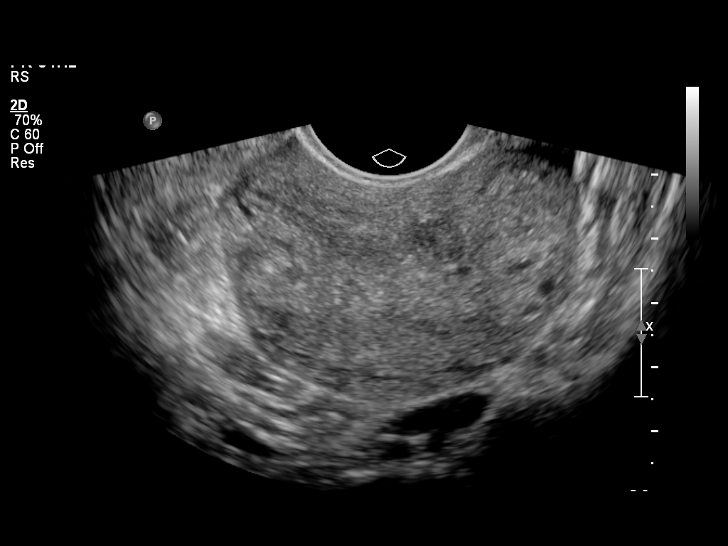
[im 34/68]
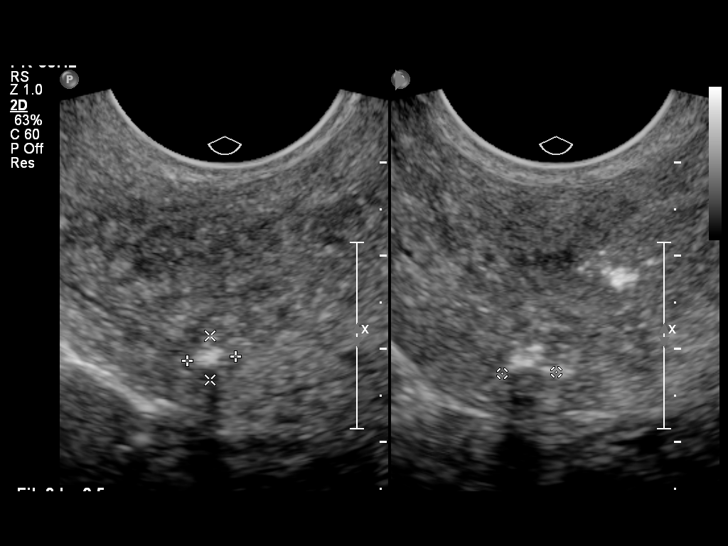
[im 40/68]
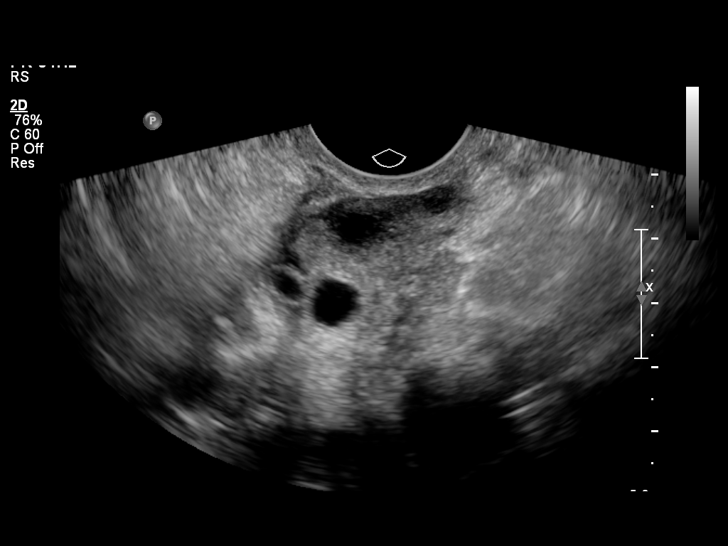
[im 45/68]
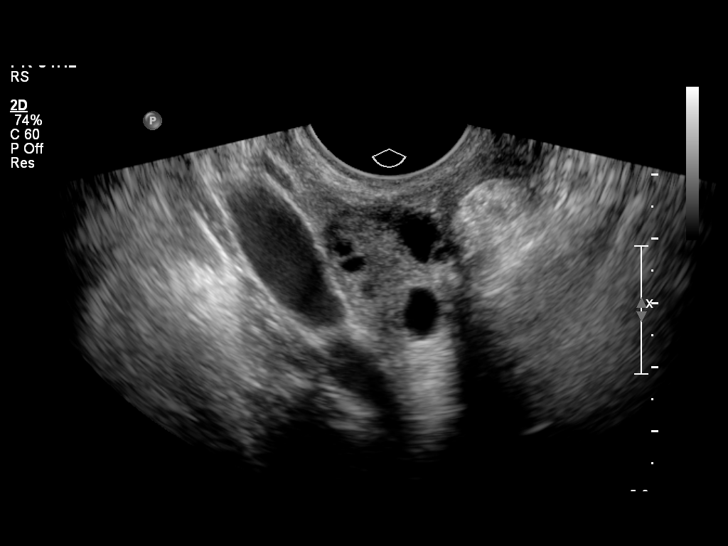
[im 51/68]
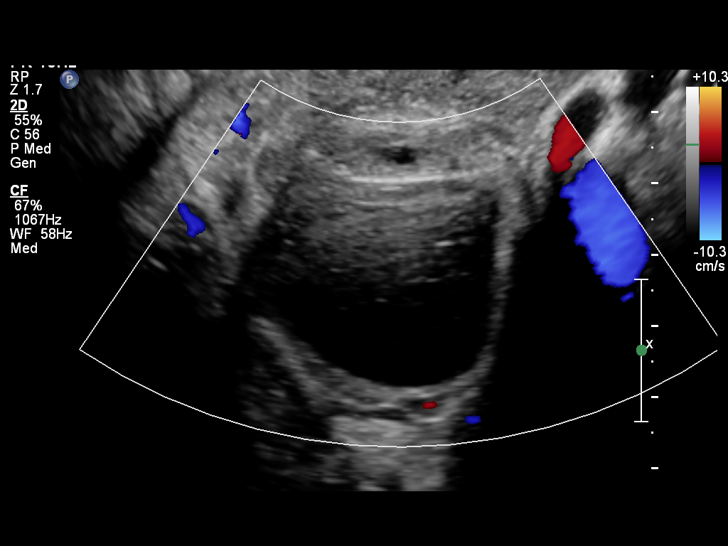
[im 56/68]
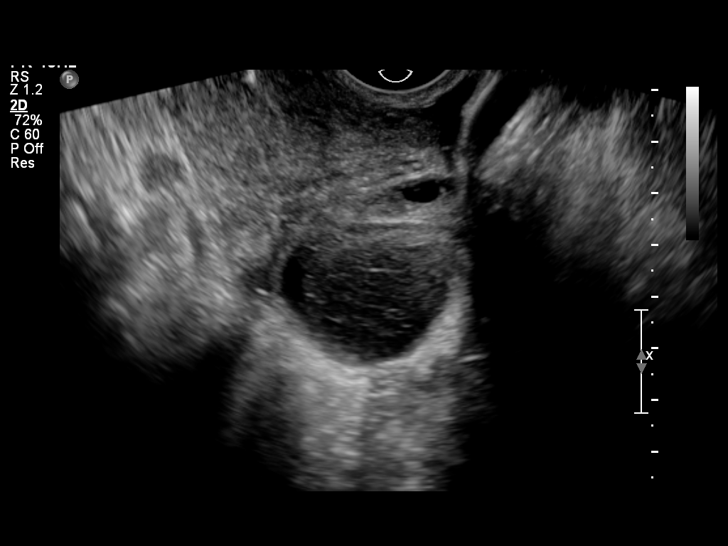
[im 62/68]
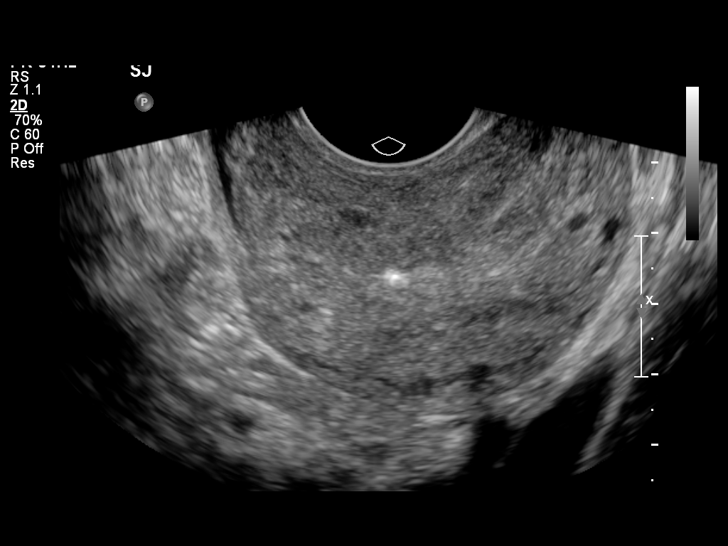
[im 68/68]
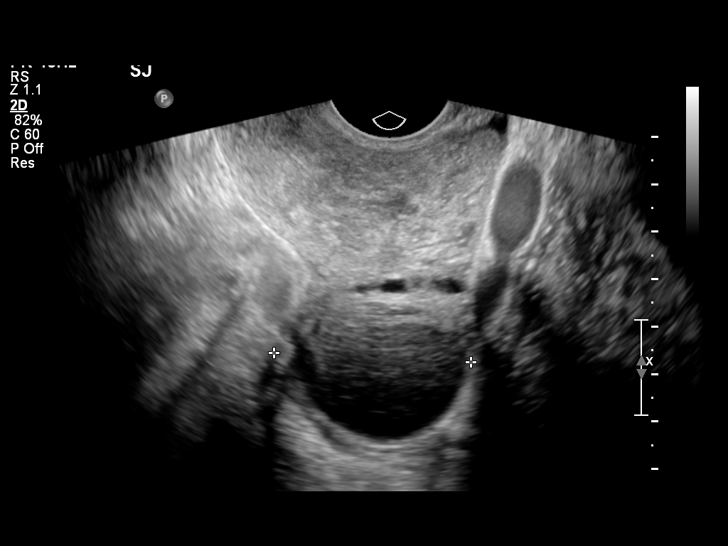

[13 of 25 positions shown; findings below may reference images not displayed]

FINDINGS: Uterus

Measurements: 8.2 x 4.3 x 6.1 cm. Small anterior intramural fibroid
measures 11 x 9 x 8 mm. Right posterior intramural fibroid measures
2.4 x 0.8 x 0.7 cm and demonstrates intralesional calcifications
compatible with degeneration. Small 6 mm left lower fibroid also
demonstrates calcifications..

Endometrium

Thickness: 4 mm.  No focal abnormality visualized.

Right ovary

Measurements: 3.6 x 2.1 x 2.2 cm. Normal appearance/no adnexal mass.

Left ovary

Measurements: 4.6 x 4.4 x 4.8 cm. 3.8 x 3.2 x 4.1 cm cystic
structure with internal echoes, likely hemorrhagic cyst

Other findings

Trace free fluid in the pelvis.
IMPRESSION: Uterine fibroids as above.

4.1 cm left ovarian hemorrhagic cyst.

## 2014-10-14 ENCOUNTER — Telehealth: Payer: Self-pay | Admitting: *Deleted

## 2014-10-14 DIAGNOSIS — N926 Irregular menstruation, unspecified: Secondary | ICD-10-CM

## 2014-10-14 MED ORDER — NORETHINDRONE 0.35 MG PO TABS: ORAL_TABLET | ORAL | Status: DC

## 2014-10-14 NOTE — Telephone Encounter (Signed)
Pt called stating that she was going on vacation next month and does not want her period during that time.  She is requesting OCP to take continuously during that time.  Per Dr Hulan Fray may start on Micronor.  Pt informed that this is not a good form of birth control since she is not breast feeding and must take the pill around the same time each day to prevent BTB.

## 2014-11-10 ENCOUNTER — Telehealth: Payer: Self-pay

## 2014-11-10 MED ORDER — PROMETHAZINE HCL 25 MG PO TABS: 25.0000 mg | ORAL_TABLET | Freq: Three times a day (TID) | ORAL | Status: DC | PRN

## 2014-11-10 NOTE — Telephone Encounter (Signed)
Also sent a Rx for phenergan to her pharmacy

## 2014-11-10 NOTE — Telephone Encounter (Signed)
[name] has had diarrhea and vomiting some lightheadedness and fatigue starting 3 am this morning. She is starting to feel a little better. I advised to increase her fluids if she can. Also to go on a BRAT diet until she feels better. She agreed.

## 2014-11-11 NOTE — Telephone Encounter (Signed)
Pt.notified

## 2014-11-11 NOTE — Telephone Encounter (Signed)
Pt notified of rx. 

## 2015-01-04 ENCOUNTER — Encounter: Payer: BLUE CROSS/BLUE SHIELD | Admitting: Family Medicine

## 2015-01-04 ENCOUNTER — Other Ambulatory Visit: Payer: Self-pay | Admitting: Obstetrics & Gynecology

## 2015-01-04 DIAGNOSIS — A6000 Herpesviral infection of urogenital system, unspecified: Secondary | ICD-10-CM

## 2015-01-05 NOTE — Telephone Encounter (Signed)
Pt needed Valtrex sent to local pharmacy instead of mail order pharmacy

## 2015-03-01 ENCOUNTER — Ambulatory Visit: Payer: BLUE CROSS/BLUE SHIELD | Admitting: Family Medicine

## 2015-04-02 ENCOUNTER — Other Ambulatory Visit: Payer: Self-pay

## 2015-04-02 DIAGNOSIS — I10 Essential (primary) hypertension: Secondary | ICD-10-CM

## 2015-04-02 MED ORDER — METOPROLOL SUCCINATE ER 50 MG PO TB24: 50.0000 mg | ORAL_TABLET | Freq: Every day | ORAL | Status: DC

## 2015-04-21 ENCOUNTER — Encounter: Payer: Self-pay | Admitting: Family Medicine

## 2015-04-21 ENCOUNTER — Ambulatory Visit (INDEPENDENT_AMBULATORY_CARE_PROVIDER_SITE_OTHER): Payer: BLUE CROSS/BLUE SHIELD | Admitting: Family Medicine

## 2015-04-21 VITALS — BP 123/67 | HR 84 | Wt 171.0 lb

## 2015-04-21 DIAGNOSIS — M549 Dorsalgia, unspecified: Secondary | ICD-10-CM

## 2015-04-21 DIAGNOSIS — I10 Essential (primary) hypertension: Secondary | ICD-10-CM

## 2015-04-21 DIAGNOSIS — M546 Pain in thoracic spine: Secondary | ICD-10-CM | POA: Diagnosis not present

## 2015-04-21 MED ORDER — METOPROLOL SUCCINATE ER 50 MG PO TB24: 50.0000 mg | ORAL_TABLET | Freq: Every day | ORAL | Status: DC

## 2015-04-21 MED ORDER — HYDROCHLOROTHIAZIDE 12.5 MG PO CAPS: 12.5000 mg | ORAL_CAPSULE | Freq: Every day | ORAL | Status: DC

## 2015-04-21 NOTE — Progress Notes (Signed)
   Subjective:    Patient ID: [name], female    DOB: 07-04-1980, 34 y.o.   MRN: [id number]  HPI  Hypertension- Pt denies chest pain, SOB, dizziness, or heart palpitations.  Taking meds as directed w/o problems.  Denies medication side effects.  She just restarted her metoprolol.    She also c/o upper back and shoulder pain that radiates into her neck at time. Started about a year ago.  Says will occ get a deep tissue massage and it really helps her pain and tension. Says when she is stressed she tends to carry  Her tension there. The right side is worse than the left.     Review of Systems     Objective:   Physical Exam  Constitutional: She is oriented to person, place, and time. She appears well-developed and well-nourished.  HENT:  Head: Normocephalic and atraumatic.  Cardiovascular: Normal rate, regular rhythm and normal heart sounds.   Pulmonary/Chest: Effort normal and breath sounds normal.  Neurological: She is alert and oriented to person, place, and time.  Skin: Skin is warm and dry.  Psychiatric: She has a normal mood and affect. Her behavior is normal.          Assessment & Plan:  HTN  - well controlled. continue current regimen. F/U in 6 moths. Due for CMP and lipids.    Upper back and neck pain - I do feel like she would benefit from massage therapy. She may want to check to see if her insurance would help cover this.  I would be happy to write a letter if needed.

## 2015-05-17 ENCOUNTER — Other Ambulatory Visit: Payer: Self-pay | Admitting: Family Medicine

## 2015-05-17 DIAGNOSIS — I10 Essential (primary) hypertension: Secondary | ICD-10-CM

## 2015-05-17 MED ORDER — METOPROLOL SUCCINATE ER 50 MG PO TB24: 50.0000 mg | ORAL_TABLET | Freq: Every day | ORAL | Status: DC

## 2015-05-17 MED ORDER — METOPROLOL SUCCINATE ER 50 MG PO TB24
50.0000 mg | ORAL_TABLET | Freq: Every day | ORAL | Status: DC
Start: 2015-05-17 — End: 2015-10-21

## 2015-07-16 ENCOUNTER — Encounter: Payer: Self-pay | Admitting: Family Medicine

## 2015-07-16 ENCOUNTER — Ambulatory Visit (INDEPENDENT_AMBULATORY_CARE_PROVIDER_SITE_OTHER): Payer: BLUE CROSS/BLUE SHIELD | Admitting: Family Medicine

## 2015-07-16 VITALS — BP 139/90 | HR 62 | Temp 98.5°F | Wt 181.0 lb

## 2015-07-16 DIAGNOSIS — H9202 Otalgia, left ear: Secondary | ICD-10-CM

## 2015-07-16 DIAGNOSIS — K121 Other forms of stomatitis: Secondary | ICD-10-CM

## 2015-07-16 DIAGNOSIS — J029 Acute pharyngitis, unspecified: Secondary | ICD-10-CM

## 2015-07-16 LAB — POCT RAPID STREP A (OFFICE): Rapid Strep A Screen: NEGATIVE

## 2015-07-16 MED ORDER — MAGIC MOUTHWASH W/LIDOCAINE: 5.0000 mL | Freq: Four times a day (QID) | ORAL | Status: DC | PRN

## 2015-07-16 NOTE — Addendum Note (Signed)
Addended by: Terance Hart on: 07/16/2015 01:37 PM   Modules accepted: Orders

## 2015-07-16 NOTE — Progress Notes (Signed)
   Subjective:    Patient ID: [name], female    DOB: November 05, 1980, 35 y.o.   MRN: [id number]  HPI She reports about 3 days ago notice the "gland" under her tongue on the left side was swollen.  Says She switch with some peroxide yesterday and feels like it's going down a little bit today. But then starting yesterday she started to get a very sore throat on that left side as well as well as some left-sided ear pain. She woke up this morning and says it feels almost like a fish bone or something is caught in her throat is not painful. She thinks she may have had a fever yesterday but is not sure. She denies any nausea vomiting or diarrhea. No upper respiratory symptom. Though she reports a little bit of nasal congestion just in the evenings. She does get allergies is Artie been taking her Zyrtec.   Review of Systems     Objective:   Physical Exam  Constitutional: She is oriented to person, place, and time. She appears well-developed and well-nourished.  HENT:  Head: Normocephalic and atraumatic.  Right Ear: External ear normal.  Left Ear: External ear normal.  Nose: Nose normal.  Mouth/Throat: Oropharynx is clear and moist.  She does have some effusion behind the left tympanic membrane but no erythema and still has presence of light reflex an ossicle. Right TM and canal are clear.  Eyes: Conjunctivae and EOM are normal. Pupils are equal, round, and reactive to light.  Neck: Neck supple. No thyromegaly present.  Cardiovascular: Normal rate, regular rhythm and normal heart sounds.   Pulmonary/Chest: Effort normal and breath sounds normal. She has no wheezes.  Lymphadenopathy:    She has no cervical adenopathy.  Neurological: She is alert and oriented to person, place, and time.  Skin: Skin is warm and dry.  Psychiatric: She has a normal mood and affect.          Assessment & Plan:  Otalgia with effusion-continue with Zyrtec and consider adding a nasal steroid spray. If getting  worse or not improving then consider treatment for otitis media.  Pharyngitis-rapid strep was negative. Will perform culture. Call if getting worse.    Mouth ulcer-we'll treat with Magic mouthwash. Call if not resolving in 5 days.

## 2015-07-18 LAB — CULTURE, GROUP A STREP: ORGANISM ID, BACTERIA: NORMAL

## 2015-07-19 ENCOUNTER — Telehealth: Payer: Self-pay

## 2015-07-19 ENCOUNTER — Other Ambulatory Visit: Payer: Self-pay

## 2015-07-19 MED ORDER — AMOXICILLIN-POT CLAVULANATE 875-125 MG PO TABS: 1.0000 | ORAL_TABLET | Freq: Two times a day (BID) | ORAL | Status: DC

## 2015-07-19 NOTE — Telephone Encounter (Signed)
Opened in error

## 2015-07-19 NOTE — Telephone Encounter (Signed)
[name] is feeling worse. She now has a cough, chills and runny nose. Please advise.

## 2015-07-19 NOTE — Telephone Encounter (Signed)
Patient advised of recommendations.  

## 2015-07-19 NOTE — Telephone Encounter (Signed)
I will call in Augmentin. If nto better by the end of week then needs to follow up .  Please send to her preferred pharmacy. She has 2 local pharms in her list.

## 2015-07-22 ENCOUNTER — Telehealth: Payer: Self-pay

## 2015-07-22 MED ORDER — BENZONATATE 200 MG PO CAPS: 200.0000 mg | ORAL_CAPSULE | Freq: Three times a day (TID) | ORAL | Status: DC | PRN

## 2015-07-22 NOTE — Telephone Encounter (Signed)
Sent in a prescription for [employer]. If this is not helpful them please call us back and let us know.

## 2015-07-22 NOTE — Telephone Encounter (Signed)
[name] states she is feeling better but she still has a cough. She would like a cough medication. Please advise.

## 2015-07-23 NOTE — Telephone Encounter (Signed)
Patient advised.

## 2015-09-29 ENCOUNTER — Telehealth: Payer: Self-pay

## 2015-09-29 NOTE — Telephone Encounter (Signed)
Lashaya called and left a message stating she never received Tessalon cough medication. I see it was sent to [facility] instead of [facility]. This was sent back in March. Is it ok to send to [facility] now since it was such a long time ago?

## 2015-09-29 NOTE — Telephone Encounter (Signed)
No, this is only for acute cough.  If she didn't receive it the first time she should have called. If she has a new cough then we can see here.

## 2015-09-29 NOTE — Telephone Encounter (Signed)
Patient advised and scheduled for an appointment.

## 2015-10-12 ENCOUNTER — Other Ambulatory Visit: Payer: Self-pay | Admitting: Family Medicine

## 2015-10-21 ENCOUNTER — Ambulatory Visit (INDEPENDENT_AMBULATORY_CARE_PROVIDER_SITE_OTHER): Payer: BLUE CROSS/BLUE SHIELD | Admitting: Family Medicine

## 2015-10-21 ENCOUNTER — Encounter: Payer: Self-pay | Admitting: Family Medicine

## 2015-10-21 VITALS — BP 111/76 | HR 81 | Wt 176.0 lb

## 2015-10-21 DIAGNOSIS — J0191 Acute recurrent sinusitis, unspecified: Secondary | ICD-10-CM | POA: Diagnosis not present

## 2015-10-21 DIAGNOSIS — R7301 Impaired fasting glucose: Secondary | ICD-10-CM

## 2015-10-21 DIAGNOSIS — I10 Essential (primary) hypertension: Secondary | ICD-10-CM | POA: Diagnosis not present

## 2015-10-21 MED ORDER — BENZONATATE 200 MG PO CAPS: 200.0000 mg | ORAL_CAPSULE | Freq: Three times a day (TID) | ORAL | Status: DC | PRN

## 2015-10-21 MED ORDER — HYDROCHLOROTHIAZIDE 12.5 MG PO CAPS: 12.5000 mg | ORAL_CAPSULE | Freq: Every day | ORAL | Status: DC

## 2015-10-21 MED ORDER — METOPROLOL SUCCINATE ER 50 MG PO TB24: 50.0000 mg | ORAL_TABLET | Freq: Every day | ORAL | Status: DC

## 2015-10-21 MED ORDER — AZITHROMYCIN 250 MG PO TABS: ORAL_TABLET | ORAL | Status: AC

## 2015-10-21 MED ORDER — AMLODIPINE BESYLATE 5 MG PO TABS: 5.0000 mg | ORAL_TABLET | Freq: Every day | ORAL | Status: DC

## 2015-10-21 NOTE — Progress Notes (Signed)
Subjective:    CC: HTN  HPI: Hypertension- Pt denies chest pain, SOB, dizziness, or heart palpitations.  Taking meds as directed w/o problems.  Denies medication side effects.  She reports that she does feel tired on the metoprolol and would like to consider switching medications. She denies any side effects from hydrochlorothiazide.  URI  - Patient says that she's been sick 3 times in the last 6 weeks. Currently she is complaining of yellow-green mucus. She denies any fevers or chills. She was recently treated with an antibiotic in May, Augmentin.  She felt some better after teh abx but not completely better.  She has had a ST, runny nose and hoarse voice.  She was previously taking an antihistamine and decongestant but stopped it as she has an allergy appointment next week to get some allergy testing done and had to be off the medication for a little over a week. She denies any facial pain or pressure. No headaches. She just feels very tired. No fever or chills or sweats.  She also recently started a weight loss program. She said that they did blood work there and she will try to bring [place] a copy. She said they did check a hemoglobin A1c and it was 5.8. She says she has started to exercise regularly.   Past medical history, Surgical history, Family history not pertinant except as noted below, Social history, Allergies, and medications have been entered into the medical record, reviewed, and corrections made.   Review of Systems: No fevers, chills, night sweats, weight loss, chest pain, or shortness of breath.   Objective:    General: Well Developed, well nourished, and in no acute distress.  Neuro: Alert and oriented x3, extra-ocular muscles intact, sensation grossly intact.  HEENT: Normocephalic, atraumatic, oropharynx is clear, TMs and canals are clear. No cervical lymphadenopathy.  Skin: Warm and dry, no rashes. Cardiac: Regular rate and rhythm, no murmurs rubs or gallops, no lower  extremity edema.  Respiratory: Clear to auscultation bilaterally. Not using accessory muscles, speaking in full sentences.   Impression and Recommendations:   HTN - Well controlled. Continue hydrochlorothiazide. Discontinue metoprolol. Start amlodipine. Follow-up in one month for blood pressure check. Again reminded her the importance of getting me a copy of her recent blood work. I really have to have an up-to-date renal function on her to prescribe her medications. She reports that her last creatinine was 0.76.  Acute sinusitis- Explained t I can't completely differentiate between allergy symptoms and her sinusitis type symptoms. She's not having any fever or facial pain but certainly she is not getting better either. We discussed waiting until her allergy appointment next week versus a trial of azithromycin. She would prefer to do the azithromycin. Perception for [employer] sent as well.  Impaired fasting glucose-Hemoccult A1c of 5.8 per her report. I did ask her to drop off a copy of her blood work. Strongly encouraged her to have her labs rechecked in about 6 months. We are happy to do that here or she can get it done at the clinic that she is currently going to for weight loss.

## 2015-11-24 ENCOUNTER — Telehealth: Payer: Self-pay | Admitting: *Deleted

## 2015-11-24 DIAGNOSIS — B009 Herpesviral infection, unspecified: Secondary | ICD-10-CM

## 2015-11-24 MED ORDER — VALACYCLOVIR HCL 500 MG PO TABS: 500.0000 mg | ORAL_TABLET | Freq: Every day | ORAL | 5 refills | Status: DC

## 2015-11-24 NOTE — Telephone Encounter (Signed)
Pt called requesting a RF on Valtrex 500 mg daily.  This was sent to [facility]

## 2016-01-18 ENCOUNTER — Other Ambulatory Visit: Payer: Self-pay | Admitting: Family Medicine

## 2016-03-10 ENCOUNTER — Other Ambulatory Visit: Payer: Self-pay

## 2016-03-10 ENCOUNTER — Other Ambulatory Visit: Payer: Self-pay | Admitting: Family Medicine

## 2016-03-10 MED ORDER — AMLODIPINE BESYLATE 5 MG PO TABS: 5.0000 mg | ORAL_TABLET | Freq: Every day | ORAL | 0 refills | Status: DC

## 2016-04-25 ENCOUNTER — Other Ambulatory Visit: Payer: Self-pay

## 2016-04-25 MED ORDER — HYDROCHLOROTHIAZIDE 12.5 MG PO CAPS: 12.5000 mg | ORAL_CAPSULE | Freq: Every day | ORAL | 0 refills | Status: DC

## 2016-04-25 NOTE — Progress Notes (Signed)
Pt will have insurance benefits in February, appt scheduled for then. Did send one month supply of HCTZ to [facility], it is on the $4 list.

## 2016-05-02 ENCOUNTER — Telehealth: Payer: Self-pay | Admitting: *Deleted

## 2016-05-02 DIAGNOSIS — B009 Herpesviral infection, unspecified: Secondary | ICD-10-CM

## 2016-05-02 MED ORDER — VALACYCLOVIR HCL 500 MG PO TABS: 500.0000 mg | ORAL_TABLET | Freq: Every day | ORAL | 1 refills | Status: DC

## 2016-05-02 NOTE — Telephone Encounter (Signed)
Pt called requesting a RF on her Valtrex be sent to [facility] order.  She has not been seen since 2016 but has been in a study for her fibroids for 1 year.  She has had Endo Bx, pap smear and mammogram.  She will get these records so that they can be scanned into her chart.  RF sent to [facility].

## 2016-05-26 ENCOUNTER — Encounter: Payer: Self-pay | Admitting: Family Medicine

## 2016-05-26 ENCOUNTER — Ambulatory Visit (INDEPENDENT_AMBULATORY_CARE_PROVIDER_SITE_OTHER): Payer: BLUE CROSS/BLUE SHIELD | Admitting: Family Medicine

## 2016-05-26 VITALS — BP 134/80 | HR 88 | Ht 68.0 in | Wt 182.0 lb

## 2016-05-26 DIAGNOSIS — R7309 Other abnormal glucose: Secondary | ICD-10-CM | POA: Diagnosis not present

## 2016-05-26 DIAGNOSIS — E559 Vitamin D deficiency, unspecified: Secondary | ICD-10-CM

## 2016-05-26 DIAGNOSIS — Z13220 Encounter for screening for lipoid disorders: Secondary | ICD-10-CM | POA: Diagnosis not present

## 2016-05-26 DIAGNOSIS — I10 Essential (primary) hypertension: Secondary | ICD-10-CM

## 2016-05-26 LAB — POCT GLYCOSYLATED HEMOGLOBIN (HGB A1C): Hemoglobin A1C: 5.4

## 2016-05-26 MED ORDER — HYDROCHLOROTHIAZIDE 12.5 MG PO CAPS: 12.5000 mg | ORAL_CAPSULE | Freq: Every day | ORAL | 1 refills | Status: DC

## 2016-05-26 MED ORDER — AMLODIPINE BESYLATE 5 MG PO TABS: 5.0000 mg | ORAL_TABLET | Freq: Every day | ORAL | 1 refills | Status: DC

## 2016-05-26 NOTE — Progress Notes (Signed)
Subjective:    Patient ID: [name], female    DOB: 09-Jun-1980, 36 y.o.   MRN: [id number]  HPI Hypertension- Pt denies chest pain, SOB, dizziness, or heart palpitations.  Taking meds as directed w/o problems.  Denies medication side effects.    She did show me some labs that she had done at a weight loss Center back in April. At that time her hemoglobin A1c was 5.8. She has not had it rechecked since then. Also noted her vitamin D was low. She takes a multivitamin but is not taking a specific vitamin D supplement.   Review of Systems   BP 134/80   Pulse 88   Ht 5\' 8"  (1.727 m)   Wt 182 lb (82.6 kg)   SpO2 100%   BMI 27.67 kg/m     Allergies  Allergen Reactions  . Guaifenesin & Derivatives Rash    Past Medical History:  Diagnosis Date  . Abnormal Pap smear 2004   colposcopy  . Fibroids    myomectomy  . Herpes genitalia 2008   Last outbreak Nov 2012  . Hypertension    2009  . IBS (irritable bowel syndrome)   . Pre-eclampsia    2011  . Preeclampsia     Past Surgical History:  Procedure Laterality Date  . CESAREAN SECTION    . CESAREAN SECTION  10/16/2011   Procedure: CESAREAN SECTION;  Surgeon: Mora Bellman, MD;  Location: [facility];  Service: Gynecology;  Laterality: N/A;  . DILATION AND CURETTAGE OF UTERUS    . DILATION AND CURETTAGE OF UTERUS     x2  . DILATION AND CURETTAGE OF UTERUS     Miscarriages x 2  . LT breast biopsy    . MYOMECTOMY ABDOMINAL APPROACH      Social History   Social History  . Marital status: Married    Spouse name: N/A  . Number of children: N/A  . Years of education: N/A   Occupational History  . speech therapist    Social History Main Topics  . Smoking status: Never Smoker  . Smokeless tobacco: Never Used  . Alcohol use Yes     Comment: rarely  . Drug use: No  . Sexual activity: Yes    Partners: Male     Comment: post baccalaurette degree, married, 1 girl, exercise twice weekly, no caffeine.   Other Topics  Concern  . Not on file   Social History Narrative  . No narrative on file    Family History  Problem Relation Age of Onset  . Alcohol abuse Father   . Breast cancer Maternal Aunt     2 maternal aunt  . Breast cancer Paternal Aunt   . Hypertension Maternal Aunt   . Hypertension Mother   . Heart attack Paternal Uncle     2 uncles  . Stroke Maternal Grandmother     TIA's  . Colon cancer Paternal Grandmother   . Stomach cancer Paternal Grandmother     Outpatient Encounter Prescriptions as of 05/26/2016  Medication Sig  . amLODipine (NORVASC) 5 MG tablet Take 1 tablet (5 mg total) by mouth daily.  . hydrochlorothiazide (MICROZIDE) 12.5 MG capsule Take 1 capsule (12.5 mg total) by mouth daily. Please keep upcoming appt  . ibuprofen (ADVIL,MOTRIN) 800 MG tablet Take 1 tablet (800 mg total) by mouth every 8 (eight) hours as needed.  . valACYclovir (VALTREX) 500 MG tablet Take 1 tablet (500 mg total) by mouth daily.  . [DISCONTINUED] amLODipine (  NORVASC) 5 MG tablet Take 1 tablet (5 mg total) by mouth daily. NEED FOLLOW UP VISIT FOR MORE REFILLS  . [DISCONTINUED] hydrochlorothiazide (MICROZIDE) 12.5 MG capsule Take 1 capsule (12.5 mg total) by mouth daily. Please keep upcoming appt  . [DISCONTINUED] benzonatate (TESSALON) 200 MG capsule Take 1 capsule (200 mg total) by mouth 3 (three) times daily as needed for cough.   No facility-administered encounter medications on file as of 05/26/2016.           Objective:   Physical Exam  Constitutional: She is oriented to person, place, and time. She appears well-developed and well-nourished.  HENT:  Head: Normocephalic and atraumatic.  Cardiovascular: Normal rate, regular rhythm and normal heart sounds.   Pulmonary/Chest: Effort normal and breath sounds normal.  Neurological: She is alert and oriented to person, place, and time.  Skin: Skin is warm and dry.  Psychiatric: She has a normal mood and affect. Her behavior is normal.        Assessment & Plan:  HTN - Well controlled. Continue current regimen. Follow up in  6 months.    Abnormal glucose-we'll perform repeat hemoglobin A1c today. Hemoglobin A1c of 5.4 today which is fantastic. Normal.    Vitamin D deficiency - Encouraged supplementation.

## 2016-06-02 ENCOUNTER — Telehealth: Payer: Self-pay

## 2016-06-02 NOTE — Telephone Encounter (Signed)
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 She would like to get Tamiflu. No symptoms. No history of CKD. Please advise.

## 2016-06-04 MED ORDER — OSELTAMIVIR PHOSPHATE 75 MG PO CAPS: 75.0000 mg | ORAL_CAPSULE | Freq: Every day | ORAL | 0 refills | Status: DC

## 2016-06-04 NOTE — Telephone Encounter (Signed)
Rx sent.   Beatrice Lecher, MD

## 2016-06-05 NOTE — Telephone Encounter (Signed)
Spoke with patient and told her Tamiflu rx has been sent in.

## 2017-02-22 ENCOUNTER — Telehealth: Payer: Self-pay

## 2017-02-22 NOTE — Telephone Encounter (Signed)
Returned pattient's call - Patient states her headache started yesterday. She states she went to a pharmacy around 12 noon today to taker her blood pressure and it was 148/108. She states she has not been taking her blood pressure medications as directed  - missed three consistent days before taking her Amlodipine and HCTZ today. She states her headache is now a 4/10 but she also feels she's coming down with a cold.  I advised patient to taker her blood pressure medications as directed and to keep her appt for 02/26/17 at 4:20 pm with provider. I also advised patient that should her headache get worse to go to the nearest Urgent Care or ER to be evaluated and treated accordingly. She stated she understood and would.

## 2017-02-23 ENCOUNTER — Encounter: Payer: Self-pay | Admitting: Physician Assistant

## 2017-02-23 ENCOUNTER — Ambulatory Visit (INDEPENDENT_AMBULATORY_CARE_PROVIDER_SITE_OTHER): Payer: BLUE CROSS/BLUE SHIELD | Admitting: Physician Assistant

## 2017-02-23 VITALS — BP 126/87 | HR 94 | Wt 173.0 lb

## 2017-02-23 DIAGNOSIS — G44201 Tension-type headache, unspecified, intractable: Secondary | ICD-10-CM

## 2017-02-23 DIAGNOSIS — I10 Essential (primary) hypertension: Secondary | ICD-10-CM

## 2017-02-23 MED ORDER — KETOROLAC TROMETHAMINE 30 MG/ML IJ SOLN
30.0000 mg | Freq: Once | INTRAMUSCULAR | Status: AC
Start: 2017-02-23 — End: 2017-02-23
  Administered 2017-02-23: 30 mg via INTRAMUSCULAR

## 2017-02-23 MED ORDER — KETOROLAC TROMETHAMINE 30 MG/ML IJ SOLN: 30.0000 mg | Freq: Once | INTRAMUSCULAR | Status: DC

## 2017-02-23 NOTE — Patient Instructions (Signed)
- Okay to take Tylenol 1000 mg every 8 hours as needed - If headache persists or worsens, or new symptoms, to the emergency room   For your blood pressure: - Continue your Amlodipine and HCTZ - Check blood pressure at home  - Limit salt to <2000 mg/day - Follow DASH eating plan - limit alcohol to 2 standard drinks per day - avoid tobacco products - weight loss: 7% of current body weight    Tension Headache A tension headache is a feeling of pain, pressure, or aching that is often felt over the front and sides of the head. The pain can be dull, or it can feel tight (constricting). Tension headaches are not normally associated with nausea or vomiting, and they do not get worse with physical activity. Tension headaches can last from 30 minutes to several days. This is the most common type of headache. CAUSES The exact cause of this condition is not known. Tension headaches often begin after stress, anxiety, or depression. Other triggers may include:  Alcohol.  Too much caffeine, or caffeine withdrawal.  Respiratory infections, such as colds, flu, or sinus infections.  Dental problems or teeth clenching.  Fatigue.  Holding your head and neck in the same position for a long period of time, such as while using a computer.  Smoking. SYMPTOMS Symptoms of this condition include:  A feeling of pressure around the head.  Dull, aching head pain.  Pain felt over the front and sides of the head.  Tenderness in the muscles of the head, neck, and shoulders. DIAGNOSIS This condition may be diagnosed based on your symptoms and a physical exam. Tests may be done, such as a CT scan or an MRI of your head. These tests may be done if your symptoms are severe or unusual. TREATMENT This condition may be treated with lifestyle changes and medicines to help relieve symptoms. HOME CARE INSTRUCTIONS Managing Pain  Take over-the-counter and prescription medicines only as told by your health care  provider.  Lie down in a dark, quiet room when you have a headache.  If directed, apply ice to the head and neck area: ? Put ice in a plastic bag. ? Place a towel between your skin and the bag. ? Leave the ice on for 20 minutes, 2-3 times per day.  Use a heating pad or a hot shower to apply heat to the head and neck area as told by your health care provider. Eating and Drinking  Eat meals on a regular schedule.  Limit alcohol use.  Decrease your caffeine intake, or stop using caffeine. General Instructions  Keep all follow-up visits as told by your health care provider. This is important.  Keep a headache journal to help find out what may trigger your headaches. For example, write down: ? What you eat and drink. ? How much sleep you get. ? Any change to your diet or medicines.  Try massage or other relaxation techniques.  Limit stress.  Sit up straight, and avoid tensing your muscles.  Do not use tobacco products, including cigarettes, chewing tobacco, or e-cigarettes. If you need help quitting, ask your health care provider.  Exercise regularly as told by your health care provider.  Get 7-9 hours of sleep, or the amount recommended by your health care provider. SEEK MEDICAL CARE IF:  Your symptoms are not helped by medicine.  You have a headache that is different from what you normally experience.  You have nausea or you vomit.  You have  a fever. SEEK IMMEDIATE MEDICAL CARE IF:  Your headache becomes severe.  You have repeated vomiting.  You have a stiff neck.  You have a loss of vision.  You have problems with speech.  You have pain in your eye or ear.  You have muscular weakness or loss of muscle control.  You lose your balance or you have trouble walking.  You feel faint or you pass out.  You have confusion. This information is not intended to replace advice given to you by your health care provider. Make sure you discuss any questions you have  with your health care provider. Document Released: 04/10/2005 Document Revised: 12/30/2014 Document Reviewed: 08/03/2014 Elsevier Interactive Patient Education  2017 [employer].

## 2017-02-23 NOTE — Progress Notes (Signed)
HPI:                                                                [name] is a 36 y.o. female who presents to [facility] today for headache  Headache   This is a new problem. The current episode started in the past 7 days (x 3 days). The problem occurs constantly. The problem has been gradually improving. The pain is located in the bilateral and frontal region. The pain radiates to the left neck and right neck. The quality of the pain is described as aching. The pain is at a severity of 6/10. Associated symptoms include scalp tenderness. Pertinent negatives include no dizziness, eye watering, fever, loss of balance, nausea, numbness, phonophobia, photophobia, rhinorrhea, visual change or weakness. Nothing aggravates the symptoms. She has tried darkened room, NSAIDs and acetaminophen for the symptoms. The treatment provided mild relief. There is no history of migraine headaches, migraines in the family or recent head traumas.  She was concerned because she had an elevated blood pressure reading at home, 140's/110 during this time.   Past Medical History:  Diagnosis Date  . Abnormal Pap smear 2004   colposcopy  . Fibroids    myomectomy  . Herpes genitalia 2008   Last outbreak Nov 2012  . Hypertension    2009  . IBS (irritable bowel syndrome)   . Pre-eclampsia    2011  . Preeclampsia    Past Surgical History:  Procedure Laterality Date  . CESAREAN SECTION    . CESAREAN SECTION  10/16/2011   Procedure: CESAREAN SECTION;  Surgeon: Mora Bellman, MD;  Location: [facility];  Service: Gynecology;  Laterality: N/A;  . DILATION AND CURETTAGE OF UTERUS    . DILATION AND CURETTAGE OF UTERUS     x2  . DILATION AND CURETTAGE OF UTERUS     Miscarriages x 2  . LT breast biopsy    . MYOMECTOMY ABDOMINAL APPROACH     Social History  Substance Use Topics  . Smoking status: Never Smoker  . Smokeless tobacco: Never Used  . Alcohol use Yes      Comment: rarely   family history includes Alcohol abuse in her father; Breast cancer in her maternal aunt and paternal aunt; Colon cancer in her paternal grandmother; Heart attack in her paternal uncle; Hypertension in her maternal aunt and mother; Stomach cancer in her paternal grandmother; Stroke in her maternal grandmother.  ROS: negative except as noted in the HPI  Medications: Current Outpatient Prescriptions  Medication Sig Dispense Refill  . amLODipine (NORVASC) 5 MG tablet Take 1 tablet (5 mg total) by mouth daily. 90 tablet 1  . hydrochlorothiazide (MICROZIDE) 12.5 MG capsule Take 1 capsule (12.5 mg total) by mouth daily. Please keep upcoming appt 90 capsule 1  . ibuprofen (ADVIL,MOTRIN) 800 MG tablet Take 1 tablet (800 mg total) by mouth every 8 (eight) hours as needed. 90 tablet 2  . valACYclovir (VALTREX) 500 MG tablet Take 1 tablet (500 mg total) by mouth daily. 90 tablet 1   No current facility-administered medications for this visit.    Allergies  Allergen Reactions  . Guaifenesin & Derivatives Rash       Objective:  BP 126/87  Pulse 94   Wt 173 lb (78.5 kg)   BMI 26.30 kg/m  Gen: well-groomed, not ill-appearing, no acute distress HEENT: head normocephalic, atraumatic; conjunctiva and cornea clear, oropharynx clear, moist mucus membranes Pulm: Normal work of breathing, normal phonation, clear to auscultation bilaterally CV: Normal rate, regular rhythm, s1 and s2 distinct, no murmurs, clicks or rubs Neuro:  cranial nerves II-XII intact, no nystagmus, no papilledema, normal finger-to-nose, normal heel-to-shin, negative pronator drift, normal coordination, DTR's intact, normal tone, no tremor MSK: strength 5/5 and symmetric in bilateral upper and lower extremities, normal gait and station, negative Romberg Mental Status: alert and oriented x 3, speech articulate, and thought processes clear and goal-directed    No flowsheet data found.   No results found  for this or any previous visit (from the past 72 hour(s)). No results found.    Assessment and Plan: 36 y.o. female with   1. Acute intractable tension-type headache - reassuring neurologic exam, no focal deficits, gradually improving, Ottawa score 0 - symptomatic management. Toradol given in office today. Discussed red flags and reasons to seek emergency care - ketorolac (TORADOL) 30 MG/ML injection 30 mg  2. Essential hypertension BP Readings from Last 3 Encounters:  02/23/17 126/87  05/26/16 134/80  10/21/15 836/62  - diastolic BP mildly elevated, no hypertensive urgency/emergency - continue current meds - follow-up with PCP    Patient education and anticipatory guidance given Patient agrees with treatment plan Follow-up as needed if symptoms worsen or fail to improve  Darlyne Russian PA-C

## 2017-02-26 ENCOUNTER — Encounter: Payer: Self-pay | Admitting: Physician Assistant

## 2017-02-26 ENCOUNTER — Ambulatory Visit: Payer: BLUE CROSS/BLUE SHIELD | Admitting: Family Medicine

## 2017-02-26 DIAGNOSIS — G44201 Tension-type headache, unspecified, intractable: Secondary | ICD-10-CM | POA: Insufficient documentation

## 2017-05-05 ENCOUNTER — Other Ambulatory Visit: Payer: Self-pay | Admitting: Physician Assistant

## 2017-05-11 ENCOUNTER — Encounter: Payer: Self-pay | Admitting: Emergency Medicine

## 2017-05-11 ENCOUNTER — Emergency Department (INDEPENDENT_AMBULATORY_CARE_PROVIDER_SITE_OTHER)
Admission: EM | Admit: 2017-05-11 | Discharge: 2017-05-11 | Disposition: A | Discharge status: Home / Self Care | Payer: BLUE CROSS/BLUE SHIELD | Source: Home / Self Care

## 2017-05-11 DIAGNOSIS — Z23 Encounter for immunization: Secondary | ICD-10-CM

## 2017-05-11 MED ORDER — INFLUENZA VAC SPLIT QUAD 0.5 ML IM SUSY
0.5000 mL | PREFILLED_SYRINGE | INTRAMUSCULAR | Status: AC
  Administered 2017-05-11: 0.5 mL via INTRAMUSCULAR

## 2017-05-11 NOTE — ED Triage Notes (Signed)
Pt here for flu vaccine. All questions answered. Given in left deltoid. Pt tolerated well.

## 2017-06-12 ENCOUNTER — Ambulatory Visit (INDEPENDENT_AMBULATORY_CARE_PROVIDER_SITE_OTHER): Payer: BLUE CROSS/BLUE SHIELD | Admitting: Obstetrics & Gynecology

## 2017-06-12 ENCOUNTER — Encounter: Payer: Self-pay | Admitting: Obstetrics & Gynecology

## 2017-06-12 VITALS — BP 135/91 | HR 83 | Ht 68.0 in | Wt 178.0 lb

## 2017-06-12 DIAGNOSIS — Z01419 Encounter for gynecological examination (general) (routine) without abnormal findings: Secondary | ICD-10-CM

## 2017-06-12 DIAGNOSIS — Z124 Encounter for screening for malignant neoplasm of cervix: Secondary | ICD-10-CM

## 2017-06-12 DIAGNOSIS — Z1151 Encounter for screening for human papillomavirus (HPV): Secondary | ICD-10-CM

## 2017-06-12 NOTE — Progress Notes (Signed)
Subjective:     [name] is a 37 y.o. female here for a routine exam.  Current complaints: Was in research study with [facility] for fibroids.  Sounds like a [place] antagonist.  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  She has been off drug for 1 year.  Fibroids growing back slowly.  Cycles have resumed.  Pt not interested in further treatment.  Dating  New person for one year and may have one more child soon.  Using vaginal film spermicide.  Pt understands that the medication she was on was keeping her from ovulating and preventing pregnancy.     Gynecologic History Patient's last menstrual period was 05/31/2017. Contraception: vaginal spermicide Last Pap: 2016 in [place]. Results were: normal Last mammogram: 2018 during study ([facility]). Results were: normal  Obstetric History OB History  [facility] Para Term Preterm AB Living  '4 2 1 1 2 2  ' SAB TAB Ectopic Multiple Live Births  2       2    # Outcome Date GA Lbr Len/2nd Weight Sex Delivery Anes PTL Lv  4 Term 10/16/11 [redacted]w[redacted]d 5 lb 8.2 oz (2.5 kg) F CS-LTranv Spinal  LIV  3 Preterm 02/25/10 [id number] lb 6 oz (1.984 kg) F CS-Unspec Spinal N LIV     Birth Comments: [facility] [place]; preeclampsia at 3375ks with induction labor  2 SAB 08/31/07 [id number]         Birth Comments: D&C  1 SAB 08/23/06 [id number]        Birth Comments: D&C       The following portions of the patient's history were reviewed and updated as appropriate: allergies, current medications, past family history, past medical history, past social history, past surgical history and problem list.  Review of Systems Pertinent items noted in HPI and remainder of comprehensive ROS otherwise negative.    Objective:      Vitals:   06/12/17 1326  BP: (!) 135/91  Pulse: 83  Weight: 178 lb (80.7 kg)  Height: '5\' 8"'  (1.727 m)   Vitals:  WNL General appearance: alert, cooperative and no distress  HEENT: Normocephalic, without obvious abnormality, atraumatic Eyes: negative Throat: lips,  mucosa, and tongue normal; teeth and gums normal  Respiratory: Clear to auscultation bilaterally  CV: Regular rate and rhythm  Breasts:  Normal appearance, no masses or tenderness, no nipple retraction or dimpling  GI: Soft, non-tender; bowel sounds normal; no masses,  no organomegaly  GU: External Genitalia:  Tanner V, no lesion Urethra:  No prolapse   Vagina: Pink, normal rugae, no blood or discharge  Cervix: No CMT, no lesion  Uterus:  Non tender, Retroverted, feels slighlty enlarged  Adnexa: Normal, no masses, non tender  Musculoskeletal: No edema, redness or tenderness in the calves or thighs  Skin: No lesions or rash  Lymphatic: Axillary adenopathy: none     Psychiatric: Normal mood and behavior   Assessment:    Healthy female exam.   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 MethJoellyn Quailsund starting surveillance. Will get US f[facility]--pt interested in fibroid sizes and location.  Has history of submucosal fibroid before myomectomy.  Wants to know if there is another one that could affect fertility.

## 2017-06-12 NOTE — Patient Instructions (Signed)
Preventive Care 18-39 Years, Female Preventive care refers to lifestyle choices and visits with your health care provider that can promote health and wellness. What does preventive care include?  A yearly physical exam. This is also called an annual well check.  Dental exams once or twice a year.  Routine eye exams. Ask your health care provider how often you should have your eyes checked.  Personal lifestyle choices, including: ? Daily care of your teeth and gums. ? Regular physical activity. ? Eating a healthy diet. ? Avoiding tobacco and drug use. ? Limiting alcohol use. ? Practicing safe sex. ? Taking vitamin and mineral supplements as recommended by your health care provider. What happens during an annual well check? The services and screenings done by your health care provider during your annual well check will depend on your age, overall health, lifestyle risk factors, and family history of disease. Counseling Your health care provider may ask you questions about your:  Alcohol use.  Tobacco use.  Drug use.  Emotional well-being.  Home and relationship well-being.  Sexual activity.  Eating habits.  Work and work Statistician.  Method of birth control.  Menstrual cycle.  Pregnancy history.  Screening You may have the following tests or measurements:  Height, weight, and BMI.  Diabetes screening. This is done by checking your blood sugar (glucose) after you have not eaten for a while (fasting).  Blood pressure.  Lipid and cholesterol levels. These may be checked every 5 years starting at age 38.  Skin check.  Hepatitis C blood test.  Hepatitis B blood test.  Sexually transmitted disease (STD) testing.  BRCA-related cancer screening. This may be done if you have a family history of breast, ovarian, tubal, or peritoneal cancers.  Pelvic exam and Pap test. This may be done every 3 years starting at age 38. Starting at age 30, this may be done  every 5 years if you have a Pap test in combination with an HPV test.  Discuss your test results, treatment options, and if necessary, the need for more tests with your health care provider. Vaccines Your health care provider may recommend certain vaccines, such as:  Influenza vaccine. This is recommended every year.  Tetanus, diphtheria, and acellular pertussis (Tdap, Td) vaccine. You may need a Td booster every 10 years.  Varicella vaccine. You may need this if you have not been vaccinated.  HPV vaccine. If you are 39 or younger, you may need three doses over 6 months.  Measles, mumps, and rubella (MMR) vaccine. You may need at least one dose of MMR. You may also need a second dose.  Pneumococcal 13-valent conjugate (PCV13) vaccine. You may need this if you have certain conditions and were not previously vaccinated.  Pneumococcal polysaccharide (PPSV23) vaccine. You may need one or two doses if you smoke cigarettes or if you have certain conditions.  Meningococcal vaccine. One dose is recommended if you are age 68-21 years and a first-year college student living in a residence hall, or if you have one of several medical conditions. You may also need additional booster doses.  Hepatitis A vaccine. You may need this if you have certain conditions or if you travel or work in places where you may be exposed to hepatitis A.  Hepatitis B vaccine. You may need this if you have certain conditions or if you travel or work in places where you may be exposed to hepatitis B.  Haemophilus influenzae type b (Hib) vaccine. You may need this  if you have certain risk factors.  Talk to your health care provider about which screenings and vaccines you need and how often you need them. This information is not intended to replace advice given to you by your health care provider. Make sure you discuss any questions you have with your health care provider. Document Released: 06/06/2001 Document Revised:  12/29/2015 Document Reviewed: 02/09/2015 Elsevier Interactive Patient Education  2018 Elsevier Inc.  

## 2017-06-14 LAB — CYTOLOGY - PAP
ADEQUACY: ABSENT
DIAGNOSIS: NEGATIVE
HPV: NOT DETECTED

## 2017-06-28 ENCOUNTER — Telehealth: Payer: Self-pay | Admitting: Obstetrics & Gynecology

## 2017-06-28 DIAGNOSIS — B009 Herpesviral infection, unspecified: Secondary | ICD-10-CM

## 2017-06-28 MED ORDER — IBUPROFEN 800 MG PO TABS: 800.0000 mg | ORAL_TABLET | Freq: Three times a day (TID) | ORAL | 2 refills | Status: DC | PRN

## 2017-06-28 NOTE — Telephone Encounter (Signed)
Pt called request refille IBUPROFEN to [facility] off [address] in [place].  Pt ph [phone].

## 2017-06-28 NOTE — Telephone Encounter (Signed)
Pt called requesting a RF on her Ibuprofen.  She takes this for mentrual cramps.  RX sent to [facility].

## 2017-07-21 ENCOUNTER — Other Ambulatory Visit: Payer: Self-pay | Admitting: Family Medicine

## 2017-07-21 DIAGNOSIS — I10 Essential (primary) hypertension: Secondary | ICD-10-CM

## 2017-08-01 ENCOUNTER — Other Ambulatory Visit: Payer: Self-pay

## 2017-08-01 DIAGNOSIS — R7301 Impaired fasting glucose: Secondary | ICD-10-CM

## 2017-08-01 DIAGNOSIS — I10 Essential (primary) hypertension: Secondary | ICD-10-CM

## 2017-08-01 DIAGNOSIS — Z0000 Encounter for general adult medical examination without abnormal findings: Secondary | ICD-10-CM

## 2017-08-13 ENCOUNTER — Other Ambulatory Visit: Payer: Self-pay | Admitting: *Deleted

## 2017-08-13 DIAGNOSIS — I10 Essential (primary) hypertension: Secondary | ICD-10-CM

## 2017-08-13 DIAGNOSIS — Z0000 Encounter for general adult medical examination without abnormal findings: Secondary | ICD-10-CM

## 2017-08-14 LAB — COMPLETE METABOLIC PANEL WITH GFR
AG RATIO: 1.1 (calc) (ref 1.0–2.5)
ALBUMIN MSPROF: 3.9 g/dL (ref 3.6–5.1)
ALT: 8 U/L (ref 6–29)
AST: 13 U/L (ref 10–30)
Alkaline phosphatase (APISO): 63 U/L (ref 33–115)
BUN: 12 mg/dL (ref 7–25)
CALCIUM: 8.8 mg/dL (ref 8.6–10.2)
CO2: 23 mmol/L (ref 20–32)
CREATININE: 0.76 mg/dL (ref 0.50–1.10)
Chloride: 106 mmol/L (ref 98–110)
GFR, EST AFRICAN AMERICAN: 116 mL/min/{1.73_m2} (ref >=60)
GFR, EST NON AFRICAN AMERICAN: 100 mL/min/{1.73_m2} (ref >=60)
GLOBULIN: 3.7 g/dL (ref 1.9–3.7)
Glucose, Bld: 93 mg/dL (ref 65–99)
POTASSIUM: 3.9 mmol/L (ref 3.5–5.3)
SODIUM: 137 mmol/L (ref 135–146)
TOTAL PROTEIN: 7.6 g/dL (ref 6.1–8.1)
Total Bilirubin: 0.3 mg/dL (ref 0.2–1.2)

## 2017-08-14 LAB — LIPID PANEL W/REFLEX DIRECT LDL
Cholesterol: 140 mg/dL (ref <200)
HDL: 62 mg/dL (ref >50)
LDL Cholesterol (Calc): 66 mg/dL (calc)
NON-HDL CHOLESTEROL (CALC): 78 mg/dL (ref <130)
Total CHOL/HDL Ratio: 2.3 (calc) (ref <5.0)
Triglycerides: 42 mg/dL (ref <150)

## 2017-08-14 LAB — CBC WITH DIFFERENTIAL/PLATELET
Basophils Absolute: 31 cells/uL (ref 0–200)
Basophils Relative: 1 %
EOS PCT: 7.7 %
Eosinophils Absolute: 239 cells/uL (ref 15–500)
HEMATOCRIT: 32.3 % — AB (ref 35.0–45.0)
HEMOGLOBIN: 10.7 g/dL — AB (ref 11.7–15.5)
LYMPHS ABS: 1600 {cells}/uL (ref 850–3900)
MCH: 27.9 pg (ref 27.0–33.0)
MCHC: 33.1 g/dL (ref 32.0–36.0)
MCV: 84.1 fL (ref 80.0–100.0)
MPV: 9.5 fL (ref 7.5–12.5)
Monocytes Relative: 16 %
NEUTROS ABS: 735 {cells}/uL — AB (ref 1500–7800)
NEUTROS PCT: 23.7 %
Platelets: 315 10*3/uL (ref 140–400)
RBC: 3.84 10*6/uL (ref 3.80–5.10)
RDW: 14.1 % (ref 11.0–15.0)
Total Lymphocyte: 51.6 %
WBC mixed population: 496 cells/uL (ref 200–950)
WBC: 3.1 10*3/uL — AB (ref 3.8–10.8)

## 2017-08-14 LAB — FERRITIN: Ferritin: 4 ng/mL — ABNORMAL LOW (ref 10–154)

## 2017-08-16 ENCOUNTER — Encounter: Payer: Self-pay | Admitting: Family Medicine

## 2017-08-16 ENCOUNTER — Ambulatory Visit (INDEPENDENT_AMBULATORY_CARE_PROVIDER_SITE_OTHER): Payer: BLUE CROSS/BLUE SHIELD | Admitting: Family Medicine

## 2017-08-16 VITALS — BP 139/80 | HR 92 | Ht 68.0 in | Wt 177.0 lb

## 2017-08-16 DIAGNOSIS — Z111 Encounter for screening for respiratory tuberculosis: Secondary | ICD-10-CM

## 2017-08-16 DIAGNOSIS — D509 Iron deficiency anemia, unspecified: Secondary | ICD-10-CM

## 2017-08-16 DIAGNOSIS — Z23 Encounter for immunization: Secondary | ICD-10-CM

## 2017-08-16 DIAGNOSIS — Z0000 Encounter for general adult medical examination without abnormal findings: Secondary | ICD-10-CM

## 2017-08-16 DIAGNOSIS — I10 Essential (primary) hypertension: Secondary | ICD-10-CM

## 2017-08-16 NOTE — Patient Instructions (Signed)

## 2017-08-16 NOTE — Progress Notes (Signed)
Subjective:     [name] is a 37 y.o. female and is here for a comprehensive physical exam. The patient reports no problems.  She is also concerned because she recently went to get a TB skin test.  She says she always gets a little bit of swelling.  This time she was actually a little bit late and out of the 72-hour window to have it checked.  But when she went to have it read they went ahead and read it as positive even though she was technically out of that window.  They then referred her to the health department who did a chest x-ray which was negative.  They diagnosed her with latent TB.  Patient is just concerned because she does not want to take 3 one months worth of medications with potential side effects unless she is really sure that she has latent TB.  Social History   Socioeconomic History  . Marital status: Single    Spouse name: Not on file  . Number of children: Not on file  . Years of education: Not on file  . Highest education level: Not on file  Occupational History  . Occupation: speech therapist  Social Needs  . Financial resource strain: Not on file  . Food insecurity:    Worry: Not on file    Inability: Not on file  . Transportation needs:    Medical: Not on file    Non-medical: Not on file  Tobacco Use  . Smoking status: Never Smoker  . Smokeless tobacco: Never Used  Substance and Sexual Activity  . Alcohol use: Yes    Comment: rarely  . Drug use: No  . Sexual activity: Yes    Partners: Male    Birth control/protection: Condom    Comment: post baccalaurette degree, married, 1 girl, exercise twice weekly, no caffeine.  Lifestyle  . Physical activity:    Days per week: Not on file    Minutes per session: Not on file  . Stress: Not on file  Relationships  . Social connections:    Talks on phone: Not on file    Gets together: Not on file    Attends religious service: Not on file    Active member of club or organization: Not on file    Attends  meetings of clubs or organizations: Not on file    Relationship status: Not on file  . Intimate partner violence:    Fear of current or ex partner: Not on file    Emotionally abused: Not on file    Physically abused: Not on file    Forced sexual activity: Not on file  Other Topics Concern  . Not on file  Social History Narrative  . Not on file   Health Maintenance  Topic Date Due  . INFLUENZA VACCINE  11/22/2017  . PAP SMEAR  06/12/2020  . TETANUS/TDAP  08/17/2027  . HIV Screening  Completed    The following portions of the patient's history were reviewed and updated as appropriate: allergies, current medications, past family history, past medical history, past social history, past surgical history and problem list.  Review of Systems A comprehensive review of systems was negative.   Objective:    BP 139/80   Pulse 92   Ht 5\' 8"  (1.727 m)   Wt 177 lb (80.3 kg)   SpO2 100%   BMI 26.91 kg/m  General appearance: alert, cooperative and appears stated age Head: Normocephalic, without obvious abnormality, atraumatic Eyes:  conj clear, EOMI, PEERLA Ears: normal TM's and external ear canals both ears Nose: Nares normal. Septum midline. Mucosa normal. No drainage or sinus tenderness. Throat: lips, mucosa, and tongue normal; teeth and gums normal Neck: no adenopathy, no carotid bruit, no JVD, supple, symmetrical, trachea midline and thyroid not enlarged, symmetric, no tenderness/mass/nodules Back: symmetric, no curvature. ROM normal. No CVA tenderness. Lungs: clear to auscultation bilaterally Breasts: normal appearance, no masses or tenderness Heart: regular rate and rhythm, S1, S2 normal, no murmur, click, rub or gallop Abdomen: soft, non-tender; bowel sounds normal; no masses,  no organomegaly Extremities: extremities normal, atraumatic, no cyanosis or edema Pulses: 2+ and symmetric Skin: Skin color, texture, turgor normal. No rashes or lesions Lymph nodes: Cervical,  supraclavicular, and axillary nodes normal. Neurologic: Alert and oriented X 3, normal strength and tone. Normal symmetric reflexes. Normal coordination and gait    Assessment:    Healthy female exam.      Plan:     See After Visit Summary for Counseling Recommendations   Keep up a regular exercise program and make sure you are eating a healthy diet Try to eat 4 servings of dairy a day, or if you are lactose intolerant take a calcium with vitamin D daily.  Your vaccines are up to date.   HTN - Bp borderline today. Will monitor .  Reviewed labs.  Iron deficiency anemia-discussed starting over-the-counter iron supplement daily.  Did warn about constipation encouraged her to use a stool softener if needed.  She does report heavy periods which certainly could be the cause.  Plan to recheck iron levels in 6 to 8 weeks.  Abnormal TB skin test-unfortunately they really should not have read the TB skin test that that was out of the window.  They really should have just repeated it and unfortunately I think that puts into question whether or not this could be an error so recommend we do a gold interferon test.  We will call her with results once available.  Tdap given.

## 2017-08-17 DIAGNOSIS — D500 Iron deficiency anemia secondary to blood loss (chronic): Secondary | ICD-10-CM | POA: Insufficient documentation

## 2017-08-17 DIAGNOSIS — D509 Iron deficiency anemia, unspecified: Secondary | ICD-10-CM | POA: Insufficient documentation

## 2017-08-24 ENCOUNTER — Telehealth: Payer: Self-pay

## 2017-08-24 DIAGNOSIS — B009 Herpesviral infection, unspecified: Secondary | ICD-10-CM

## 2017-08-24 MED ORDER — VALACYCLOVIR HCL 500 MG PO TABS: 500.0000 mg | ORAL_TABLET | Freq: Every day | ORAL | 1 refills | Status: DC

## 2017-08-24 NOTE — Telephone Encounter (Signed)
Pharmacy called requesting refill for pt's Valtrex. Pt is up to date on annual exam. Refill sent.

## 2017-10-17 ENCOUNTER — Ambulatory Visit: Payer: BLUE CROSS/BLUE SHIELD | Admitting: Obstetrics & Gynecology

## 2017-10-22 ENCOUNTER — Ambulatory Visit: Payer: BLUE CROSS/BLUE SHIELD | Admitting: Obstetrics & Gynecology

## 2017-10-25 ENCOUNTER — Other Ambulatory Visit: Payer: Self-pay | Admitting: Family Medicine

## 2017-10-25 DIAGNOSIS — I10 Essential (primary) hypertension: Secondary | ICD-10-CM

## 2017-10-30 ENCOUNTER — Encounter: Payer: Self-pay | Admitting: Obstetrics and Gynecology

## 2017-10-30 ENCOUNTER — Ambulatory Visit: Payer: 59 | Admitting: Obstetrics and Gynecology

## 2017-10-30 VITALS — BP 131/89 | HR 86 | Ht 68.0 in | Wt 177.0 lb

## 2017-10-30 DIAGNOSIS — N920 Excessive and frequent menstruation with regular cycle: Secondary | ICD-10-CM

## 2017-10-30 DIAGNOSIS — D259 Leiomyoma of uterus, unspecified: Secondary | ICD-10-CM

## 2017-10-30 MED ORDER — NORETHINDRONE 0.35 MG PO TABS: 1.0000 | ORAL_TABLET | Freq: Every day | ORAL | 11 refills | Status: DC

## 2017-10-30 NOTE — Progress Notes (Signed)
37 yo [id number] here for the evaluation of heavier cycles. Patient with history of fibroid uterus. She reports onset of heavier cycles 2-3 months ago. She experiences vaginal bleeding for 6 days and half of those days are heavy with passage of clots. She denies any cramping pain. She was recently diagnosed with anemia. She denies chest pain, lightheadedness/dizziness.  Past Medical History:  Diagnosis Date  . Abnormal Pap smear 2004   colposcopy  . Fibroids    myomectomy  . Herpes genitalia 2008   Last outbreak Nov 2012  . Hypertension    2009  . IBS (irritable bowel syndrome)   . Pre-eclampsia    2011  . Preeclampsia    Past Surgical History:  Procedure Laterality Date  . CESAREAN SECTION    . CESAREAN SECTION  10/16/2011   Procedure: CESAREAN SECTION;  Surgeon: Mora Bellman, MD;  Location: [facility];  Service: Gynecology;  Laterality: N/A;  . DILATION AND CURETTAGE OF UTERUS    . DILATION AND CURETTAGE OF UTERUS     x2  . DILATION AND CURETTAGE OF UTERUS     Miscarriages x 2  . LT breast biopsy    . MYOMECTOMY ABDOMINAL APPROACH     Family History  Problem Relation Age of Onset  . Alcohol abuse Father   . Hypertension Mother   . Breast cancer Maternal Aunt        2 maternal aunt  . Breast cancer Paternal Aunt   . Hypertension Maternal Aunt   . Heart attack Paternal Uncle        2 uncles  . Stroke Maternal Grandmother        TIA's  . Colon cancer Paternal Grandmother   . Stomach cancer Paternal Grandmother    Social History   Tobacco Use  . Smoking status: Never Smoker  . Smokeless tobacco: Never Used  Substance Use Topics  . Alcohol use: Yes    Comment: rarely  . Drug use: No   ROS See pertinent in HPI Blood pressure 131/89, pulse 86, height 5\' 8"  (1.727 m), weight 177 lb (80.3 kg), last menstrual period 10/09/2017. GENERAL: Well-developed, well-nourished female in no acute distress.  NEURO: alert and oriented x 3  A/P 37 yo with fibroid uterus and  menorrhagia with regular cycle - pelvic ultrasound ordered - discussed medical management with contraception for now. Patient opted for progesterone only pill - patient is hoping to conceive in the next 12 months - Patient will be contacted with ultrasound results and further management plan - continue iron supplementation

## 2017-10-30 NOTE — Progress Notes (Signed)
Pt states that she is changing a pad/tampon every 2-3 hours the first 3 days of her period

## 2017-10-31 ENCOUNTER — Ambulatory Visit (INDEPENDENT_AMBULATORY_CARE_PROVIDER_SITE_OTHER): Payer: 59

## 2017-10-31 DIAGNOSIS — D259 Leiomyoma of uterus, unspecified: Secondary | ICD-10-CM | POA: Diagnosis not present

## 2017-11-01 ENCOUNTER — Telehealth: Payer: Self-pay | Admitting: *Deleted

## 2017-11-01 ENCOUNTER — Encounter: Payer: Self-pay | Admitting: *Deleted

## 2017-11-01 NOTE — Telephone Encounter (Signed)
-----   Message from Mora Bellman, MD sent at 10/31/2017  4:21 PM EDT ----- Regarding: ultrasound result Please inform patient of normal size uterus with multiple small fibroids, largest being 2.7 cm in size. No need for surgical intervention at this time. Continue medical management of excessive bleeding with birth control pill until ready to conceive  Thanks  Peggy

## 2017-11-01 NOTE — Telephone Encounter (Signed)
Pt notified of U/S results and Dr Domenic Schwab recommendations.

## 2018-02-19 ENCOUNTER — Telehealth: Payer: Self-pay

## 2018-02-19 DIAGNOSIS — B009 Herpesviral infection, unspecified: Secondary | ICD-10-CM

## 2018-02-19 MED ORDER — VALACYCLOVIR HCL 500 MG PO TABS: 500.0000 mg | ORAL_TABLET | Freq: Every day | ORAL | 1 refills | Status: DC

## 2018-02-19 NOTE — Telephone Encounter (Signed)
PT called requesting her Rx for Valtrex to be switched to the [facility]. Rx fixed.

## 2018-04-11 ENCOUNTER — Telehealth: Payer: Self-pay | Admitting: *Deleted

## 2018-04-11 MED ORDER — NORETHINDRONE 0.35 MG PO TABS: 1.0000 | ORAL_TABLET | Freq: Every day | ORAL | 3 refills | Status: DC

## 2018-04-11 NOTE — Telephone Encounter (Signed)
Pt called wanting to switch her Micronor to [facility] home delivery.  RX sent via [employer].

## 2018-05-09 ENCOUNTER — Other Ambulatory Visit: Payer: Self-pay | Admitting: Family Medicine

## 2018-05-09 DIAGNOSIS — I10 Essential (primary) hypertension: Secondary | ICD-10-CM

## 2018-05-13 ENCOUNTER — Ambulatory Visit: Payer: 59 | Admitting: Obstetrics & Gynecology

## 2018-05-13 ENCOUNTER — Encounter: Payer: Self-pay | Admitting: Obstetrics & Gynecology

## 2018-05-13 VITALS — BP 137/87 | HR 85 | Resp 16 | Ht 68.0 in | Wt 179.0 lb

## 2018-05-13 DIAGNOSIS — D259 Leiomyoma of uterus, unspecified: Secondary | ICD-10-CM

## 2018-05-13 DIAGNOSIS — D509 Iron deficiency anemia, unspecified: Secondary | ICD-10-CM

## 2018-05-13 DIAGNOSIS — N920 Excessive and frequent menstruation with regular cycle: Secondary | ICD-10-CM

## 2018-05-13 NOTE — Progress Notes (Signed)
   Subjective:    Patient ID: Chistina Roston, female    DOB: [birth year], 38 y.o.   MRN: [id number]  HPI 38 yo P2 here for [facility] due to menorrhagia. Her u/s showed fibroids and a 17 mm endomotrium. She has been taking POPs but this has not helped   Review of Systems Pap 2/19 negative    Objective:   Physical Exam Breathing, conversing, and ambulating normally Well nourished, well hydrated Black female, no apparent distress  UPT negative, consent signed, time out done Cervix prepped with betadine and grasped with a single tooth tenaculum Uterus sounded to 9 cm Pipelle used for 2 passes with a moderate amount of tissue obtained. She tolerated the procedure well.      Assessment & Plan:  Menorrhagia with regular cycle- check CBC and TSH I have advised her that if she wants a pregnancy, then she needs to take her MVI consistently and stop taking the POPs. I have also told her that I will place a referral to Dr. Kerin Perna so that she can use that prn.

## 2018-05-14 LAB — CBC
HCT: 34.5 % — ABNORMAL LOW (ref 35.0–45.0)
HEMOGLOBIN: 11.2 g/dL — AB (ref 11.7–15.5)
MCH: 26.7 pg — ABNORMAL LOW (ref 27.0–33.0)
MCHC: 32.5 g/dL (ref 32.0–36.0)
MCV: 82.3 fL (ref 80.0–100.0)
MPV: 10.4 fL (ref 7.5–12.5)
Platelets: 346 10*3/uL (ref 140–400)
RBC: 4.19 10*6/uL (ref 3.80–5.10)
RDW: 15.3 % — ABNORMAL HIGH (ref 11.0–15.0)
WBC: 3 10*3/uL — ABNORMAL LOW (ref 3.8–10.8)

## 2018-05-14 LAB — TSH: TSH: 1.02 m[IU]/L

## 2018-07-03 ENCOUNTER — Other Ambulatory Visit: Payer: Self-pay | Admitting: Obstetrics & Gynecology

## 2018-07-03 DIAGNOSIS — B009 Herpesviral infection, unspecified: Secondary | ICD-10-CM

## 2018-08-20 ENCOUNTER — Other Ambulatory Visit: Payer: Self-pay

## 2018-08-20 ENCOUNTER — Encounter: Payer: Self-pay | Admitting: Family Medicine

## 2018-08-20 ENCOUNTER — Telehealth (INDEPENDENT_AMBULATORY_CARE_PROVIDER_SITE_OTHER): Payer: 59 | Admitting: Family Medicine

## 2018-08-20 VITALS — Ht 68.0 in

## 2018-08-20 DIAGNOSIS — J302 Other seasonal allergic rhinitis: Secondary | ICD-10-CM | POA: Diagnosis not present

## 2018-08-20 DIAGNOSIS — Z566 Other physical and mental strain related to work: Secondary | ICD-10-CM

## 2018-08-20 DIAGNOSIS — H1013 Acute atopic conjunctivitis, bilateral: Secondary | ICD-10-CM | POA: Diagnosis not present

## 2018-08-20 MED ORDER — FLUTICASONE PROPIONATE 50 MCG/ACT NA SUSP: 2.0000 | Freq: Every day | NASAL | 6 refills | Status: AC

## 2018-08-20 NOTE — Progress Notes (Signed)
Virtual Visit via Video Note  I connected with [name] on 08/20/18 at  4:20 PM EDT by a video enabled telemedicine application and verified that I am speaking with the correct person using two identifiers.   I discussed the limitations of evaluation and management by telemedicine and the availability of in person appointments. The patient expressed understanding and agreed to proceed.  Pt was at home and I was in my office for the virtual visit.     Subjective:    CC: C/o swollen eyelids.    HPI: Sxs started last October. These became more bothersome over the past 2 months. She takes OTC allergy medications: zyrtec,allegra,and cortisone cream. No blurred vision.    Sunday her eyes were extremely red,itchy and watery, and she reports nasal drainage. She stated that her sxs will go away x 1 wk and come back. She took a benadryl last night which helped some w/the itching of her eyes.  Gets itching in throat and ears.  She just found her pataday.  She does wear contacts but takes them out at night.    No Hx of asthma. 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  She witnessed one die a couple of days ago who had diarrhea and vomiting and died suddenly within hours.    Past medical history, Surgical history, Family history not pertinant except as noted below, Social history, Allergies, and medications have been entered into the medical record, reviewed, and corrections made.   Review of Systems: No fevers, chills, night sweats, weight loss, chest pain, or shortness of breath.   Objective:    General: Speaking clearly in complete sentences without any shortness of breath.  Alert and oriented x3.  Normal judgment. No apparent acute distress.    Impression and Recommendations:    Allergic  conjunctivitis-discussed diagnosis.  She said she actually just found her Pataday yesterday and put it in the last evening.  On her to continue with that.  I also want her to add a nasal steroid spray so sent over a prescription for fluticasone to the pharmacy.  Just explained to her that it can take 3 to 5 days to be effective.  If at that point she is actually doing better she can also try backing off of her oral antihistamine.-  Studies show that nasal steroid spray is just as effective as an oral antihistamine so that may help her actually minimize her medication.  If in a few weeks she is doing better with her eye symptoms she can also try backing off on the eyedrops and then continue with the nasal steroid spray.  We also discussed that if she does not get significant relief with the addition of these therapies that we can always consider referring her back to an allergist.  She actually did have a consultation about 3 years ago at that time.  She really only tested positive for dust mites and for beef and it was quite expensive so she did not continue therapy with them at that time.  Stress-just encouraged her to stay safe as possible.  It sounds like there have been several people in the nursing home who may have had COVID.  I just want her to make sure that she is wearing her PPE and staying safe as possible  and to call immediately if she starts experiencing these worrisome symptoms.      I discussed the assessment and treatment plan with the patient. The patient was provided an opportunity to ask questions and all were answered. The patient agreed with the plan and demonstrated an understanding of the instructions.   The patient was advised to call back or seek an in-person evaluation if the symptoms worsen or if the condition fails to improve as anticipated.   Beatrice Lecher, MD

## 2018-08-20 NOTE — Progress Notes (Signed)
October last year is when her sxs began. These became more bothersome over the past 2 months. She takes OTC allergy medication zyrtec,allegra,and  cortisone cream.  No blurred vision.    Sunday her eyes were extremely red,itchy and watery, and she reports nasal drainage.   She stated that her sxs will go away x 1 wk and come back.  She took a benadryl last night which helped some w/the itching of her eyes  No Hx of asthma. She denies any recent  changes in her facial moisturizers, make up, but did report that there was a change to a cleanser but she eliminated that.Maryruth Eve, Lahoma Crocker, CMA

## 2018-10-31 ENCOUNTER — Other Ambulatory Visit: Payer: Self-pay | Admitting: Family Medicine

## 2018-10-31 DIAGNOSIS — I10 Essential (primary) hypertension: Secondary | ICD-10-CM

## 2018-11-11 ENCOUNTER — Telehealth: Payer: Self-pay | Admitting: *Deleted

## 2018-11-12 ENCOUNTER — Ambulatory Visit (INDEPENDENT_AMBULATORY_CARE_PROVIDER_SITE_OTHER): Payer: 59 | Admitting: Family Medicine

## 2018-11-12 ENCOUNTER — Telehealth: Payer: Self-pay

## 2018-11-12 DIAGNOSIS — Z1159 Encounter for screening for other viral diseases: Secondary | ICD-10-CM

## 2018-11-12 DIAGNOSIS — D259 Leiomyoma of uterus, unspecified: Secondary | ICD-10-CM

## 2018-11-12 NOTE — Telephone Encounter (Signed)
Syndey works in [employer]. She will have to have a COVID-19 before returning to work next week. The day they are doing the testing at her job she is off work. Please advise. She is aware of the turn around time.

## 2018-11-12 NOTE — Telephone Encounter (Signed)
Pt called stating that she has been seeing Dr Kerin Perna for fibroids and would like to be referred to another REI because he is out of network for her.  [facility] is in network and pt is aware that she does not need a referral for them.

## 2018-11-12 NOTE — Telephone Encounter (Signed)
How long has she been out of work?  And why was she out of work.  I did a video visit with her in April for allergies but have not seen her since.  When she seen at another facility?  Is 1 to get a little bit more information.

## 2018-11-12 NOTE — Progress Notes (Signed)
Acute Office Visit  Subjective:    Patient ID: [name], female    DOB: 11-May-1980, 38 y.o.   MRN: [id number]  Chief Complaint  Patient presents with  . covid testing    HPI Patient is in today to be tested for COVID.  [name] works at a [employer] facility.  Her work is requiring a COVID-19 test.  She has not had any known exposures and she is completely asymptomatic.  No fevers chills sweats, sore throat, GI upset, cough, or shortness of breath.  They are going to do testing through work but she will actually be out of town the day but they are doing testing today requiring her to get an outside test.  Past Medical History:  Diagnosis Date  . Abnormal Pap smear 2004   colposcopy  . Fibroids    myomectomy  . Herpes genitalia 2008   Last outbreak Nov 2012  . Hypertension    2009  . IBS (irritable bowel syndrome)   . Pre-eclampsia    2011  . Preeclampsia     Past Surgical History:  Procedure Laterality Date  . CESAREAN SECTION    . CESAREAN SECTION  10/16/2011   Procedure: CESAREAN SECTION;  Surgeon: Mora Bellman, MD;  Location: [facility];  Service: Gynecology;  Laterality: N/A;  . DILATION AND CURETTAGE OF UTERUS    . DILATION AND CURETTAGE OF UTERUS     x2  . DILATION AND CURETTAGE OF UTERUS     Miscarriages x 2  . LT breast biopsy    . MYOMECTOMY ABDOMINAL APPROACH      Family History  Problem Relation Age of Onset  . Alcohol abuse Father   . Hypertension Mother   . Breast cancer Maternal Aunt        2 maternal aunt  . Breast cancer Paternal Aunt   . Hypertension Maternal Aunt   . Heart attack Paternal Uncle        2 uncles  . Stroke Maternal Grandmother        TIA's  . Colon cancer Paternal Grandmother   . Stomach cancer Paternal Grandmother     Social History   Socioeconomic History  . Marital status: Single    Spouse name: Not on file  . Number of children: Not on file  . Years of education: Not on file  . Highest education level: Not  on file  Occupational History  . Occupation: speech therapist  Social Needs  . Financial resource strain: Not on file  . Food insecurity    Worry: Not on file    Inability: Not on file  . Transportation needs    Medical: Not on file    Non-medical: Not on file  Tobacco Use  . Smoking status: Never Smoker  . Smokeless tobacco: Never Used  Substance and Sexual Activity  . Alcohol use: Yes    Comment: rarely  . Drug use: No  . Sexual activity: Yes    Partners: Male    Birth control/protection: Condom, Pill    Comment: post baccalaurette degree, married, 1 girl, exercise twice weekly, no caffeine.  Lifestyle  . Physical activity    Days per week: Not on file    Minutes per session: Not on file  . Stress: Not on file  Relationships  . Social Herbalist on phone: Not on file    Gets together: Not on file    Attends religious service: Not on file  Active member of club or organization: Not on file    Attends meetings of clubs or organizations: Not on file    Relationship status: Not on file  . Intimate partner violence    Fear of current or ex partner: Not on file    Emotionally abused: Not on file    Physically abused: Not on file    Forced sexual activity: Not on file  Other Topics Concern  . Not on file  Social History Narrative  . Not on file    Outpatient Medications Prior to Visit  Medication Sig Dispense Refill  . amLODipine (NORVASC) 5 MG tablet Take 1 tablet (5 mg total) by mouth daily. 30 DAY SUPPLY GIVEN MUST SCHEDULE/KEEP APPOINTMENT FOR REFILLS 30 tablet 0  . fluticasone (FLONASE) 50 MCG/ACT nasal spray Place 2 sprays into both nostrils daily. 16 g 6  . hydrochlorothiazide (MICROZIDE) 12.5 MG capsule Take 1 capsule (12.5 mg total) by mouth daily. 30 DAY SUPPLY GIVEN MUST SCHEDULE/KEEP APPOINTMENT FOR REFILLS 30 capsule 0  . ibuprofen (ADVIL,MOTRIN) 800 MG tablet Take 1 tablet (800 mg total) by mouth every 8 (eight) hours as needed. 90 tablet 2  .  norethindrone (MICRONOR,CAMILA,ERRIN) 0.35 MG tablet Take 1 tablet (0.35 mg total) by mouth daily. 3 Package 3  . valACYclovir (VALTREX) 500 MG tablet Take 1 tablet (500 mg total) by mouth daily. 90 tablet 1   No facility-administered medications prior to visit.     Allergies  Allergen Reactions  . Guaifenesin & Derivatives Rash    ROS     Objective:    Physical Exam  There were no vitals taken for this visit. Wt Readings from Last 3 Encounters:  05/13/18 179 lb (81.2 kg)  10/30/17 177 lb (80.3 kg)  08/16/17 177 lb (80.3 kg)    There are no preventive care reminders to display for this patient.  There are no preventive care reminders to display for this patient.   Lab Results  Component Value Date   TSH 1.02 05/13/2018   Lab Results  Component Value Date   WBC 3.0 (L) 05/13/2018   HGB 11.2 (L) 05/13/2018   HCT 34.5 (L) 05/13/2018   MCV 82.3 05/13/2018   PLT 346 05/13/2018   Lab Results  Component Value Date   NA 137 08/13/2017   K 3.9 08/13/2017   CO2 23 08/13/2017   GLUCOSE 93 08/13/2017   BUN 12 08/13/2017   CREATININE 0.76 08/13/2017   BILITOT 0.3 08/13/2017   ALKPHOS 62 04/12/2011   AST 13 08/13/2017   ALT 8 08/13/2017   PROT 7.6 08/13/2017   ALBUMIN 3.9 04/12/2011   CALCIUM 8.8 08/13/2017   Lab Results  Component Value Date   CHOL 140 08/13/2017   Lab Results  Component Value Date   HDL 62 08/13/2017   Lab Results  Component Value Date   LDLCALC 66 08/13/2017   Lab Results  Component Value Date   TRIG 42 08/13/2017   Lab Results  Component Value Date   CHOLHDL 2.3 08/13/2017   Lab Results  Component Value Date   HGBA1C 5.4 05/26/2016       Assessment & Plan:   Problem List Items Addressed This Visit    None    Visit Diagnoses    Screening for viral disease    -  Primary   Relevant Orders   Novel Coronavirus, NAA ([facility])     Asymptomatic screening for COVID.  Nasal swab performed.  Swab was monitored and  patient did  a good job.  No orders of the defined types were placed in this encounter.    Beatrice Lecher, MD

## 2018-11-12 NOTE — Telephone Encounter (Signed)
She is out because she schedule to be off. The testing is a company request for the entire staff. She has not been exposed and does not have symptoms.

## 2018-11-14 LAB — SPECIMEN STATUS REPORT

## 2018-11-14 LAB — NOVEL CORONAVIRUS, NAA: SARS-CoV-2, NAA: NOT DETECTED

## 2018-11-28 ENCOUNTER — Ambulatory Visit: Payer: 59 | Admitting: Obstetrics & Gynecology

## 2018-12-09 ENCOUNTER — Encounter: Payer: Self-pay | Admitting: Family Medicine

## 2018-12-10 ENCOUNTER — Encounter: Payer: Self-pay | Admitting: *Deleted

## 2018-12-10 ENCOUNTER — Telehealth: Payer: Self-pay | Admitting: *Deleted

## 2018-12-10 NOTE — Telephone Encounter (Signed)
Pt called a bit confused because she has been told different information concerning her fibroids.  She was sent to Dr Kerin Perna for evaluation of her fibroids and a possible myomectomy.  Pt stated that he did a TVU and saw 21 fibroids and some in the cavity of the uterus.  He had recommended a sonohysterogram.  Due to the fact that he was out of network for her insurance, she sought a consult with [facility] Dr Vickki Hearing Ob/Gyn.  Per pt she did the sonohysterogram and stated that she saw 7 fibroids, none of which were in the cavity.  Pt is requesting records from both office and is requesting that Dr Hulan Fray review and give her advice.  She had previously wanted Dr Hulan Fray to do a myomectomy but since she is not through with child bearing she was referred to Dr Lincoln Maxin who is an REI MD.

## 2018-12-10 NOTE — Telephone Encounter (Signed)
Called to check on patient, states she is going to [facility] at 6:00 to be tested because they have rapid testing. No further needs at this time, patient will send [place] results via MyChart

## 2018-12-20 ENCOUNTER — Other Ambulatory Visit: Payer: Self-pay

## 2018-12-20 ENCOUNTER — Emergency Department
Admission: EM | Admit: 2018-12-20 | Discharge: 2018-12-20 | Discharge status: Absent without leave | Payer: 59 | Source: Home / Self Care

## 2018-12-26 ENCOUNTER — Ambulatory Visit: Payer: 59 | Admitting: Obstetrics & Gynecology

## 2019-01-06 ENCOUNTER — Encounter: Payer: Self-pay | Admitting: Family Medicine

## 2019-01-07 MED ORDER — TRIAMCINOLONE ACETONIDE 0.1 % EX CREA: 1.0000 "application " | TOPICAL_CREAM | Freq: Two times a day (BID) | CUTANEOUS | 0 refills | Status: DC

## 2019-02-06 ENCOUNTER — Other Ambulatory Visit: Payer: Self-pay | Admitting: Family Medicine

## 2019-02-06 ENCOUNTER — Other Ambulatory Visit: Payer: Self-pay | Admitting: Obstetrics & Gynecology

## 2019-02-06 DIAGNOSIS — B009 Herpesviral infection, unspecified: Secondary | ICD-10-CM

## 2019-02-06 DIAGNOSIS — I10 Essential (primary) hypertension: Secondary | ICD-10-CM

## 2019-02-07 ENCOUNTER — Other Ambulatory Visit: Payer: Self-pay | Admitting: Family Medicine

## 2019-02-07 ENCOUNTER — Other Ambulatory Visit: Payer: Self-pay | Admitting: *Deleted

## 2019-02-07 DIAGNOSIS — I10 Essential (primary) hypertension: Secondary | ICD-10-CM

## 2019-04-25 HISTORY — PX: HYSTEROSCOPY: SHX211

## 2019-05-12 ENCOUNTER — Other Ambulatory Visit: Payer: Self-pay | Admitting: Family Medicine

## 2019-05-12 DIAGNOSIS — I10 Essential (primary) hypertension: Secondary | ICD-10-CM

## 2019-05-15 ENCOUNTER — Other Ambulatory Visit: Payer: Self-pay | Admitting: Obstetrics & Gynecology

## 2019-05-15 DIAGNOSIS — B009 Herpesviral infection, unspecified: Secondary | ICD-10-CM

## 2019-06-05 ENCOUNTER — Other Ambulatory Visit: Payer: Self-pay

## 2019-06-05 ENCOUNTER — Encounter: Payer: Self-pay | Admitting: Family Medicine

## 2019-06-05 ENCOUNTER — Ambulatory Visit (INDEPENDENT_AMBULATORY_CARE_PROVIDER_SITE_OTHER): Payer: PRIVATE HEALTH INSURANCE | Admitting: Family Medicine

## 2019-06-05 VITALS — BP 159/89 | HR 88 | Ht 68.0 in | Wt 191.0 lb

## 2019-06-05 DIAGNOSIS — I10 Essential (primary) hypertension: Secondary | ICD-10-CM | POA: Diagnosis not present

## 2019-06-05 DIAGNOSIS — O3410 Maternal care for benign tumor of corpus uteri, unspecified trimester: Secondary | ICD-10-CM | POA: Diagnosis not present

## 2019-06-05 DIAGNOSIS — D259 Leiomyoma of uterus, unspecified: Secondary | ICD-10-CM | POA: Diagnosis not present

## 2019-06-05 DIAGNOSIS — D509 Iron deficiency anemia, unspecified: Secondary | ICD-10-CM | POA: Diagnosis not present

## 2019-06-05 MED ORDER — HYDROCHLOROTHIAZIDE 12.5 MG PO CAPS: 12.5000 mg | ORAL_CAPSULE | Freq: Every day | ORAL | 1 refills | Status: DC

## 2019-06-05 MED ORDER — AMLODIPINE BESYLATE 5 MG PO TABS: 5.0000 mg | ORAL_TABLET | Freq: Every day | ORAL | 1 refills | Status: DC

## 2019-06-05 NOTE — Assessment & Plan Note (Signed)
Wished her good luck on her attempts to get pregnant again.  Hopefully she will do really well.  Unfortunately her uterine fibroids have been a big issue and preventing her from getting pregnant.  Hopefully she will be able to be successful.

## 2019-06-05 NOTE — Assessment & Plan Note (Addendum)
Due or updated lab work.  If she does get pregnant we need to change her medication regimen.  Blood pressure not well controlled today but she took her medication right before she came in.  She does not have access to a blood pressure cuff and will also be following up with her endocrinologist soon and so can check her pressure there.  If it is remaining elevated that encouraged her to give [place] call back so that we can adjust her medication regimen.  She does take her hydrochlorothiazide midday because it does cause diuresis.

## 2019-06-05 NOTE — Assessment & Plan Note (Signed)
To recheck iron levels.  Continue with over-the-counter supplementation.

## 2019-06-05 NOTE — Progress Notes (Signed)
Established Patient Office Visit  Subjective:  Patient ID: [name], female    DOB: [birth year]  Age: 39 y.o. MRN: [id number]  CC:  Chief Complaint  Patient presents with  . Hypertension    HPI [name] presents for   Hypertension- Pt denies chest pain, SOB, dizziness, or heart palpitations.  Taking meds as directed w/o problems.  Denies medication side effects.  Took her med right before her appt.   History of iron deficiency-she did see a reproductive endocrinologist this summer as they would like to try to get pregnant again.  She was told then that her iron was still low and has been taking an over-the-counter supplement which she is actually been tolerating really well without any side effects.  On Orilissa on and off for her uterine fibroids but says she is not taking it currently.  Past Medical History:  Diagnosis Date  . Abnormal Pap smear 2004   colposcopy  . Fibroids    myomectomy  . Herpes genitalia 2008   Last outbreak Nov 2012  . Hypertension    2009  . IBS (irritable bowel syndrome)   . Pre-eclampsia    2011  . Preeclampsia     Past Surgical History:  Procedure Laterality Date  . CESAREAN SECTION    . CESAREAN SECTION  10/16/2011   Procedure: CESAREAN SECTION;  Surgeon: Mora Bellman, MD;  Location: [facility];  Service: Gynecology;  Laterality: N/A;  . DILATION AND CURETTAGE OF UTERUS    . DILATION AND CURETTAGE OF UTERUS     x2  . DILATION AND CURETTAGE OF UTERUS     Miscarriages x 2  . LT breast biopsy    . MYOMECTOMY ABDOMINAL APPROACH      Family History  Problem Relation Age of Onset  . Alcohol abuse Father   . Hypertension Mother   . Breast cancer Maternal Aunt        2 maternal aunt  . Breast cancer Paternal Aunt   . Hypertension Maternal Aunt   . Heart attack Paternal Uncle        2 uncles  . Stroke Maternal Grandmother        TIA's  . Colon cancer Paternal Grandmother   . Stomach cancer Paternal Grandmother     Social  History   Socioeconomic History  . Marital status: Single    Spouse name: Not on file  . Number of children: Not on file  . Years of education: Not on file  . Highest education level: Not on file  Occupational History  . Occupation: speech therapist  Tobacco Use  . Smoking status: Never Smoker  . Smokeless tobacco: Never Used  Substance and Sexual Activity  . Alcohol use: Yes    Comment: rarely  . Drug use: No  . Sexual activity: Yes    Partners: Male    Birth control/protection: Condom, Pill    Comment: post baccalaurette degree, married, 1 girl, exercise twice weekly, no caffeine.  Other Topics Concern  . Not on file  Social History Narrative  . Not on file   Social Determinants of Health   Financial Resource Strain:   . Difficulty of Paying Living Expenses: Not on file  Food Insecurity:   . Worried About Charity fundraiser in the Last Year: Not on file  . Ran Out of Food in the Last Year: Not on file  Transportation Needs:   . Lack of Transportation (Medical): Not on file  . Lack  of Transportation (Non-Medical): Not on file  Physical Activity:   . Days of Exercise per Week: Not on file  . Minutes of Exercise per Session: Not on file  Stress:   . Feeling of Stress : Not on file  Social Connections:   . Frequency of Communication with Friends and Family: Not on file  . Frequency of Social Gatherings with Friends and Family: Not on file  . Attends Religious Services: Not on file  . Active Member of Clubs or Organizations: Not on file  . Attends Archivist Meetings: Not on file  . Marital Status: Not on file  Intimate Partner Violence:   . Fear of Current or Ex-Partner: Not on file  . Emotionally Abused: Not on file  . Physically Abused: Not on file  . Sexually Abused: Not on file    Outpatient Medications Prior to Visit  Medication Sig Dispense Refill  . fluticasone (FLONASE) 50 MCG/ACT nasal spray Place 2 sprays into both nostrils daily. 16 g 6  .  ibuprofen (ADVIL,MOTRIN) 800 MG tablet Take 1 tablet (800 mg total) by mouth every 8 (eight) hours as needed. 90 tablet 2  . valACYclovir (VALTREX) 500 MG tablet Take 1 tablet (500 mg total) by mouth daily. 90 tablet 1  . amLODipine (NORVASC) 5 MG tablet Take 1 tablet (5 mg total) by mouth daily. 2 WEEK SUPPLY GIVEN.MUST SCHEDULE AND KEEP APPOINTMENT FOR REFILLS 15 tablet 0  . hydrochlorothiazide (MICROZIDE) 12.5 MG capsule Take 1 capsule (12.5 mg total) by mouth daily. 2 WEEK SUPPLY GIVEN.MUST SCHEDULE AND KEEP APPOINTMENT FOR REFILLS 15 capsule 0  . cetirizine (ZYRTEC) 10 MG tablet Take 10 mg by mouth daily.      . danazol (DANOCRINE) 200 MG capsule Take 200 mg by mouth 2 (two) times daily.  11  . letrozole (FEMARA) 2.5 MG tablet Take by mouth.    . norethindrone (MICRONOR,CAMILA,ERRIN) 0.35 MG tablet Take 1 tablet (0.35 mg total) by mouth daily. 3 Package 3  . ORILISSA 200 MG TABS Take 1 tablet by mouth 2 (two) times daily.    Marland Kitchen triamcinolone cream (KENALOG) 0.1 % Apply 1 application topically 2 (two) times daily. 15 g 0   No facility-administered medications prior to visit.    Allergies  Allergen Reactions  . Guaifenesin Rash  . Guaifenesin & Derivatives Rash    ROS Review of Systems    Objective:    Physical Exam  BP (!) 159/89   Pulse 88   Ht 5\' 8"  (1.727 m)   Wt 191 lb (86.6 kg)   LMP 05/25/2019 (Exact Date)   SpO2 100%   BMI 29.04 kg/m  Wt Readings from Last 3 Encounters:  06/05/19 191 lb (86.6 kg)  05/13/18 179 lb (81.2 kg)  10/30/17 177 lb (80.3 kg)     There are no preventive care reminders to display for this patient.  There are no preventive care reminders to display for this patient.  Lab Results  Component Value Date   TSH 1.02 05/13/2018   Lab Results  Component Value Date   WBC 3.0 (L) 05/13/2018   HGB 11.2 (L) 05/13/2018   HCT 34.5 (L) 05/13/2018   MCV 82.3 05/13/2018   PLT 346 05/13/2018   Lab Results  Component Value Date   NA 137  08/13/2017   K 3.9 08/13/2017   CO2 23 08/13/2017   GLUCOSE 93 08/13/2017   BUN 12 08/13/2017   CREATININE 0.76 08/13/2017   BILITOT 0.3 08/13/2017  ALKPHOS 62 04/12/2011   AST 13 08/13/2017   ALT 8 08/13/2017   PROT 7.6 08/13/2017   ALBUMIN 3.9 04/12/2011   CALCIUM 8.8 08/13/2017   Lab Results  Component Value Date   CHOL 140 08/13/2017   Lab Results  Component Value Date   HDL 62 08/13/2017   Lab Results  Component Value Date   LDLCALC 66 08/13/2017   Lab Results  Component Value Date   TRIG 42 08/13/2017   Lab Results  Component Value Date   CHOLHDL 2.3 08/13/2017   Lab Results  Component Value Date   HGBA1C 5.4 05/26/2016      Assessment & Plan:   Problem List Items Addressed This Visit      Cardiovascular and Mediastinum   HYPERTENSION, BENIGN    Due or updated lab work.  If she does get pregnant we need to change her medication regimen.      Relevant Medications   hydrochlorothiazide (MICROZIDE) 12.5 MG capsule   amLODipine (NORVASC) 5 MG tablet   Other Relevant Orders   COMPLETE METABOLIC PANEL WITH GFR   Lipid Panel w/reflex Direct LDL     Genitourinary   Uterine fibroids affecting pregnancy    Wished her good luck on her attempts to get pregnant again.  Hopefully she will do really well.  Unfortunately her uterine fibroids have been a big issue and preventing her from getting pregnant.  Hopefully she will be able to be successful.        Other   Iron deficiency anemia - Primary    To recheck iron levels.  Continue with over-the-counter supplementation.      Relevant Orders   CBC   Fe+TIBC+Fer      Wished her good luck on her attempts to get pregnant again.  Hopefully she will do really well.  Unfortunately her uterine fibroids have been a big issue and preventing her from getting pregnant.  Hopefully she will be able to be successful.  Meds ordered this encounter  Medications  . hydrochlorothiazide (MICROZIDE) 12.5 MG capsule     Sig: Take 1 capsule (12.5 mg total) by mouth daily.    Dispense:  90 capsule    Refill:  1  . amLODipine (NORVASC) 5 MG tablet    Sig: Take 1 tablet (5 mg total) by mouth daily.    Dispense:  90 tablet    Refill:  1    Follow-up: Return in about 6 months (around 12/03/2019) for Hypertension.    Beatrice Lecher, MD

## 2019-06-07 LAB — CBC
HCT: 34.6 % — ABNORMAL LOW (ref 35.0–45.0)
Hemoglobin: 10.7 g/dL — ABNORMAL LOW (ref 11.7–15.5)
MCH: 24.7 pg — ABNORMAL LOW (ref 27.0–33.0)
MCHC: 30.9 g/dL — ABNORMAL LOW (ref 32.0–36.0)
MCV: 79.9 fL — ABNORMAL LOW (ref 80.0–100.0)
MPV: 10.5 fL (ref 7.5–12.5)
Platelets: 280 10*3/uL (ref 140–400)
RBC: 4.33 10*6/uL (ref 3.80–5.10)
RDW: 16.1 % — ABNORMAL HIGH (ref 11.0–15.0)
WBC: 2.2 10*3/uL — ABNORMAL LOW (ref 3.8–10.8)

## 2019-06-07 LAB — IRON,TIBC AND FERRITIN PANEL
%SAT: 7 % (calc) — ABNORMAL LOW (ref 16–45)
Ferritin: 5 ng/mL — ABNORMAL LOW (ref 16–154)
Iron: 32 ug/dL — ABNORMAL LOW (ref 40–190)
TIBC: 446 mcg/dL (calc) (ref 250–450)

## 2019-06-07 LAB — COMPLETE METABOLIC PANEL WITH GFR
AG Ratio: 1.1 (calc) (ref 1.0–2.5)
ALT: 12 U/L (ref 6–29)
AST: 14 U/L (ref 10–30)
Albumin: 4.1 g/dL (ref 3.6–5.1)
Alkaline phosphatase (APISO): 72 U/L (ref 31–125)
BUN: 13 mg/dL (ref 7–25)
CO2: 28 mmol/L (ref 20–32)
Calcium: 9.2 mg/dL (ref 8.6–10.2)
Chloride: 103 mmol/L (ref 98–110)
Creat: 0.8 mg/dL (ref 0.50–1.10)
GFR, Est African American: 108 mL/min/{1.73_m2} (ref >=60)
GFR, Est Non African American: 93 mL/min/{1.73_m2} (ref >=60)
Globulin: 3.9 g/dL (calc) — ABNORMAL HIGH (ref 1.9–3.7)
Glucose, Bld: 89 mg/dL (ref 65–99)
Potassium: 4 mmol/L (ref 3.5–5.3)
Sodium: 137 mmol/L (ref 135–146)
Total Bilirubin: 0.4 mg/dL (ref 0.2–1.2)
Total Protein: 8 g/dL (ref 6.1–8.1)

## 2019-06-07 LAB — LIPID PANEL W/REFLEX DIRECT LDL
Cholesterol: 146 mg/dL (ref <200)
HDL: 60 mg/dL (ref >=50)
LDL Cholesterol (Calc): 74 mg/dL (calc)
Non-HDL Cholesterol (Calc): 86 mg/dL (calc) (ref <130)
Total CHOL/HDL Ratio: 2.4 (calc) (ref <5.0)
Triglycerides: 43 mg/dL (ref <150)

## 2019-06-09 ENCOUNTER — Encounter: Payer: Self-pay | Admitting: Family Medicine

## 2019-06-11 ENCOUNTER — Telehealth: Payer: Self-pay

## 2019-06-11 NOTE — Telephone Encounter (Signed)
I actually just sent her back on my chart about the white blood cell count.  I just had not had a chance to type out a good explanation until today.  In regards to the fusion..... The reason that I suggested is is because it does not tend to cause constipation.  It can, but is much less likely compared to the traditional over-the-counter iron supplements.  But certainly that is up to her if she wants to try it but she has to get her iron levels up some way.  I also offered to refer her for a infusion if she would like.

## 2019-06-11 NOTE — Telephone Encounter (Signed)
Sweetie states she doesn't want to take the fusion if it is going to make her constipated. She also wanted to know why and/or what to do about the abnormal WBC.

## 2019-06-12 NOTE — Telephone Encounter (Signed)
Left pt msg regarding providers note. Advised her the office is closed today but to send MyChart if any other needs or she can call office tomorrow

## 2019-06-23 ENCOUNTER — Encounter: Payer: Self-pay | Admitting: Family Medicine

## 2019-06-23 DIAGNOSIS — D509 Iron deficiency anemia, unspecified: Secondary | ICD-10-CM

## 2019-06-23 MED ORDER — FUSION PLUS PO CAPS: 1.0000 | ORAL_CAPSULE | Freq: Every day | ORAL | 3 refills | Status: DC

## 2019-06-24 NOTE — Telephone Encounter (Signed)
amb hem ref placed.

## 2019-07-14 ENCOUNTER — Other Ambulatory Visit: Payer: Self-pay | Admitting: Hematology

## 2019-07-14 DIAGNOSIS — D509 Iron deficiency anemia, unspecified: Secondary | ICD-10-CM

## 2019-07-14 NOTE — Progress Notes (Signed)
[name]  Patient Care Team: Hali Marry, MD as PCP - General  HEME/ONC OVERVIEW: 1. Iron deficiency anemia secondary to menorrhagia (uterine fibroids)  ASSESSMENT & PLAN:   Iron deficiency anemia  -I reviewed patient's records in detail, including PCP clinic notes and lab studies -In summary, patient has had chronic iron deficiency anemia since 2019, likely secondary to menorrhagia from uterine fibroids.  Hemoglobin has been fluctuating between 10 and 11.  Patient was taking birth control for menorrhagia, but stopped taking it due to her desires to become pregnant.  Patient is referred to hematology for further management. -I reviewed the lab results in detail with the patient -Hgb 11.6; iron profile pending -We discussed some of the risks, benefits, and alternatives of intravenous iron infusions.  -The patient is symptomatic from anemia and has not responded to oral supplement.  Therefore, she will require an IV iron infusion to achieve adequate hematopoiesis. -Some of the side-effects to be expected including risks of infusion reactions, phlebitis, headaches, nausea and fatigue.   -The patient is willing to proceed.  We have reached out to her insurance to determine the preferred formulation, and will contact the patient to set up the IV iron infusion .  -Goal is to keep ferritin level greater than 50. -As she has been trying to become pregnant, I have ordered serum pregnancy test to rule out pregnancy, as IV iron is generally avoided during the 1st trimester of pregnancy due to insufficient studies  -Continue oral iron supplement q2days   Menorrhagia -Most likely secondary to uterine fibroids -I discussed with the patient that until the source of bleeding is controlled (i.e. menorrhagia), she may experience periodic iron deficiency anemia requiring IV iron infusion -Patient expressed understanding, and agreed with the plan.  Family history of  stroke -Patient had a grandmother that had "multiple strokes," but denies any other family member with recurrent stroke or venous thromboembolism -I discussed with the patient that in the absence of any personal history of VTE or strong family history of stroke or VTE, hypercoaguable work-up is generally not indicated  -It would be helpful to know what type of stroke the patient's grandmother had, and the patient will look into it  No orders of the defined types were placed in this encounter.  The total time spent in the encounter was 55 minutes, including face-to-face time with the patient, review of various tests results, order additional studies/medications, documentation, and coordination of care plan.   All questions were answered. The patient knows to call the clinic with any problems, questions or concerns. No barriers to learning was detected.  Return in 3 months for labs and clinic follow-up with NP [facility].  IV iron infusion TBD, pending insurance authorization.   Tish Men, MD 3/23/20211:14 PM  CHIEF COMPLAINTS/PURPOSE OF CONSULTATION:  "I am just tired"  HISTORY OF PRESENTING ILLNESS:  [name] 39 y.o. female is here because of iron deficiency anemia secondary to menorrhagia.  Patient reports that she has had chronic menorrhagia, for which she had myomectomy for uterine fibroids about 10 years ago.  She had seen fertility specialist and was placed on the medication for menorrhagia, which provided significant relief, but she stopped the medication due to her desire to get pregnant.  She reports that she has menstrual cycle every 28 days, lasting 6 to 7 days, heavy during the first 2 to 3 days, during which she changes pads every 2 hours as well as multiple tampons.  She currently takes oral iron supplement daily as well as Senokot twice a week to help with constipation.  She reports fatigue and insomnia.  She denies any other symptoms of bleeding.  REVIEW OF SYSTEMS:    Constitutional: ( - ) fevers, ( - )  chills , ( - ) night sweats Eyes: ( - ) blurriness of vision, ( - ) double vision, ( - ) watery eyes Ears, nose, mouth, throat, and face: ( - ) mucositis, ( - ) sore throat Respiratory: ( - ) cough, ( - ) dyspnea, ( - ) wheezes Cardiovascular: ( - ) palpitation, ( - ) chest discomfort, ( - ) lower extremity swelling Gastrointestinal:  ( - ) nausea, ( - ) heartburn, ( - ) change in bowel habits Skin: ( - ) abnormal skin rashes Lymphatics: ( - ) new lymphadenopathy, ( - ) easy bruising Neurological: ( - ) numbness, ( - ) tingling, ( - ) new weaknesses Behavioral/Psych: ( - ) mood change, ( - ) new changes  All other systems were reviewed with the patient and are negative.  I have reviewed her chart and materials related to her cancer extensively and collaborated history with the patient. Summary of oncologic history is as follows: Oncology History   No history exists.    MEDICAL HISTORY:  Past Medical History:  Diagnosis Date  . Abnormal Pap smear 2004   colposcopy  . Fibroids    myomectomy  . Herpes genitalia 2008   Last outbreak Nov 2012  . Hypertension    2009  . IBS (irritable bowel syndrome)   . Pre-eclampsia    2011  . Preeclampsia     SURGICAL HISTORY: Past Surgical History:  Procedure Laterality Date  . CESAREAN SECTION    . CESAREAN SECTION  10/16/2011   Procedure: CESAREAN SECTION;  Surgeon: Mora Bellman, MD;  Location: [facility];  Service: Gynecology;  Laterality: N/A;  . DILATION AND CURETTAGE OF UTERUS    . DILATION AND CURETTAGE OF UTERUS     x2  . DILATION AND CURETTAGE OF UTERUS     Miscarriages x 2  . LT breast biopsy    . MYOMECTOMY ABDOMINAL APPROACH      SOCIAL HISTORY: Social History   Socioeconomic History  . Marital status: Single    Spouse name: Not on file  . Number of children: Not on file  . Years of education: Not on file  . Highest education level: Not on file  Occupational History  .  Occupation: speech therapist  Tobacco Use  . Smoking status: Never Smoker  . Smokeless tobacco: Never Used  Substance and Sexual Activity  . Alcohol use: Yes    Comment: rarely  . Drug use: No  . Sexual activity: Yes    Partners: Male    Birth control/protection: Condom, Pill    Comment: post baccalaurette degree, married, 1 girl, exercise twice weekly, no caffeine.  Other Topics Concern  . Not on file  Social History Narrative  . Not on file   Social Determinants of Health   Financial Resource Strain:   . Difficulty of Paying Living Expenses:   Food Insecurity:   . Worried About Charity fundraiser in the Last Year:   . Arboriculturist in the Last Year:   Transportation Needs:   . Film/video editor (Medical):   Marland Kitchen Lack of Transportation (Non-Medical):   Physical Activity:   . Days of Exercise per Week:   .  Minutes of Exercise per Session:   Stress:   . Feeling of Stress :   Social Connections:   . Frequency of Communication with Friends and Family:   . Frequency of Social Gatherings with Friends and Family:   . Attends Religious Services:   . Active Member of Clubs or Organizations:   . Attends Archivist Meetings:   Marland Kitchen Marital Status:   Intimate Partner Violence:   . Fear of Current or Ex-Partner:   . Emotionally Abused:   Marland Kitchen Physically Abused:   . Sexually Abused:     FAMILY HISTORY: Family History  Problem Relation Age of Onset  . Alcohol abuse Father   . Hypertension Mother   . Breast cancer Maternal Aunt        2 maternal aunt  . Breast cancer Paternal Aunt   . Hypertension Maternal Aunt   . Heart attack Paternal Uncle        2 uncles  . Stroke Maternal Grandmother        TIA's  . Colon cancer Paternal Grandmother   . Stomach cancer Paternal Grandmother     ALLERGIES:  is allergic to guaifenesin and guaifenesin & derivatives.  MEDICATIONS:  Current Outpatient Medications  Medication Sig Dispense Refill  . amLODipine (NORVASC) 5 MG  tablet Take 1 tablet (5 mg total) by mouth daily. 90 tablet 1  . fluticasone (FLONASE) 50 MCG/ACT nasal spray Place 2 sprays into both nostrils daily. 16 g 6  . hydrochlorothiazide (MICROZIDE) 12.5 MG capsule Take 1 capsule (12.5 mg total) by mouth daily. 90 capsule 1  . ibuprofen (ADVIL,MOTRIN) 800 MG tablet Take 1 tablet (800 mg total) by mouth every 8 (eight) hours as needed. 90 tablet 2  . Iron-FA-B Cmp-C-Biot-Probiotic (FUSION PLUS) CAPS Take 1 capsule by mouth daily. 30 capsule 3  . valACYclovir (VALTREX) 500 MG tablet Take 1 tablet (500 mg total) by mouth daily. 90 tablet 1   No current facility-administered medications for this visit.    PHYSICAL EXAMINATION: ECOG PERFORMANCE STATUS: 0 - Asymptomatic  Vitals:   07/15/19 1253  BP: (!) 148/92  Pulse: 86  Resp: 18  Temp: (!) 97.3 F (36.3 C)  SpO2: 100%   Filed Weights   07/15/19 1253  Weight: 195 lb 1.3 oz (88.5 kg)    GENERAL: alert, no distress and comfortable SKIN: skin color, texture, turgor are normal, no rashes or significant lesions EYES: conjunctiva are pink and non-injected, sclera clear OROPHARYNX: no exudate, no erythema; lips, buccal mucosa, and tongue normal  NECK: supple, non-tender LUNGS: clear to auscultation with normal breathing effort HEART: regular rate & rhythm, no murmurs, no lower extremity edema ABDOMEN: soft, non-tender, non-distended, normal bowel sounds Musculoskeletal: no cyanosis of digits and no clubbing  PSYCH: alert & oriented x 3, fluent speech  LABORATORY DATA:  I have reviewed the data as listed Lab Results  Component Value Date   WBC 3.5 (L) 07/15/2019   HGB 11.6 (L) 07/15/2019   HCT 37.0 07/15/2019   MCV 85.1 07/15/2019   PLT 278 07/15/2019   Lab Results  Component Value Date   NA 136 07/15/2019   K 4.2 07/15/2019   CL 100 07/15/2019   CO2 29 07/15/2019    RADIOGRAPHIC STUDIES: I have personally reviewed the radiological images as listed and agreed with the findings  in the report. No results found.  PATHOLOGY: I have reviewed the pathology reports as documented in the oncologist history.

## 2019-07-15 ENCOUNTER — Inpatient Hospital Stay (HOSPITAL_BASED_OUTPATIENT_CLINIC_OR_DEPARTMENT_OTHER): Payer: PRIVATE HEALTH INSURANCE | Admitting: Hematology

## 2019-07-15 ENCOUNTER — Encounter: Payer: Self-pay | Admitting: Hematology

## 2019-07-15 ENCOUNTER — Other Ambulatory Visit: Payer: Self-pay | Admitting: *Deleted

## 2019-07-15 ENCOUNTER — Other Ambulatory Visit: Payer: Self-pay

## 2019-07-15 ENCOUNTER — Other Ambulatory Visit: Payer: Self-pay | Admitting: Hematology

## 2019-07-15 ENCOUNTER — Inpatient Hospital Stay: Payer: PRIVATE HEALTH INSURANCE | Attending: Hematology

## 2019-07-15 VITALS — BP 148/92 | HR 86 | Temp 97.3°F | Resp 18 | Ht 68.0 in | Wt 195.1 lb

## 2019-07-15 DIAGNOSIS — K589 Irritable bowel syndrome without diarrhea: Secondary | ICD-10-CM | POA: Diagnosis not present

## 2019-07-15 DIAGNOSIS — Z803 Family history of malignant neoplasm of breast: Secondary | ICD-10-CM | POA: Diagnosis not present

## 2019-07-15 DIAGNOSIS — Z800 Family history of malignant neoplasm of digestive organs: Secondary | ICD-10-CM | POA: Diagnosis not present

## 2019-07-15 DIAGNOSIS — Z791 Long term (current) use of non-steroidal anti-inflammatories (NSAID): Secondary | ICD-10-CM | POA: Insufficient documentation

## 2019-07-15 DIAGNOSIS — D259 Leiomyoma of uterus, unspecified: Secondary | ICD-10-CM | POA: Insufficient documentation

## 2019-07-15 DIAGNOSIS — I10 Essential (primary) hypertension: Secondary | ICD-10-CM

## 2019-07-15 DIAGNOSIS — D509 Iron deficiency anemia, unspecified: Secondary | ICD-10-CM

## 2019-07-15 DIAGNOSIS — D500 Iron deficiency anemia secondary to blood loss (chronic): Secondary | ICD-10-CM | POA: Diagnosis present

## 2019-07-15 DIAGNOSIS — G4700 Insomnia, unspecified: Secondary | ICD-10-CM

## 2019-07-15 DIAGNOSIS — Z8249 Family history of ischemic heart disease and other diseases of the circulatory system: Secondary | ICD-10-CM | POA: Insufficient documentation

## 2019-07-15 DIAGNOSIS — Z79899 Other long term (current) drug therapy: Secondary | ICD-10-CM | POA: Insufficient documentation

## 2019-07-15 DIAGNOSIS — N920 Excessive and frequent menstruation with regular cycle: Secondary | ICD-10-CM

## 2019-07-15 LAB — CMP (CANCER CENTER ONLY)
ALT: 23 U/L (ref 0–44)
AST: 30 U/L (ref 15–41)
Albumin: 4.3 g/dL (ref 3.5–5.0)
Alkaline Phosphatase: 68 U/L (ref 38–126)
Anion gap: 7 (ref 5–15)
BUN: 16 mg/dL (ref 6–20)
CO2: 29 mmol/L (ref 22–32)
Calcium: 9.8 mg/dL (ref 8.9–10.3)
Chloride: 100 mmol/L (ref 98–111)
Creatinine: 0.82 mg/dL (ref 0.44–1.00)
GFR, Est AFR Am: >60 mL/min (ref >60)
GFR, Estimated: >60 mL/min (ref >60)
Glucose, Bld: 110 mg/dL — ABNORMAL HIGH (ref 70–99)
Potassium: 4.2 mmol/L (ref 3.5–5.1)
Sodium: 136 mmol/L (ref 135–145)
Total Bilirubin: 0.3 mg/dL (ref 0.3–1.2)
Total Protein: 8.2 g/dL — ABNORMAL HIGH (ref 6.5–8.1)

## 2019-07-15 LAB — CBC WITH DIFFERENTIAL (CANCER CENTER ONLY)
Abs Immature Granulocytes: 0 10*3/uL (ref 0.00–0.07)
Basophils Absolute: 0 10*3/uL (ref 0.0–0.1)
Basophils Relative: 1 %
Eosinophils Absolute: 0.2 10*3/uL (ref 0.0–0.5)
Eosinophils Relative: 7 %
HCT: 37 % (ref 36.0–46.0)
Hemoglobin: 11.6 g/dL — ABNORMAL LOW (ref 12.0–15.0)
Immature Granulocytes: 0 %
Lymphocytes Relative: 36 %
Lymphs Abs: 1.3 10*3/uL (ref 0.7–4.0)
MCH: 26.7 pg (ref 26.0–34.0)
MCHC: 31.4 g/dL (ref 30.0–36.0)
MCV: 85.1 fL (ref 80.0–100.0)
Monocytes Absolute: 0.5 10*3/uL (ref 0.1–1.0)
Monocytes Relative: 14 %
Neutro Abs: 1.5 10*3/uL — ABNORMAL LOW (ref 1.7–7.7)
Neutrophils Relative %: 42 %
Platelet Count: 278 10*3/uL (ref 150–400)
RBC: 4.35 MIL/uL (ref 3.87–5.11)
RDW: 21.5 % — ABNORMAL HIGH (ref 11.5–15.5)
WBC Count: 3.5 10*3/uL — ABNORMAL LOW (ref 4.0–10.5)
nRBC: 0 % (ref 0.0–0.2)

## 2019-07-15 LAB — PREGNANCY, URINE: Preg Test, Ur: NEGATIVE

## 2019-07-15 LAB — SAVE SMEAR(SSMR), FOR PROVIDER SLIDE REVIEW

## 2019-07-16 ENCOUNTER — Telehealth: Payer: Self-pay | Admitting: Family

## 2019-07-16 LAB — IRON AND TIBC
Iron: 421 ug/dL — ABNORMAL HIGH (ref 41–142)
Saturation Ratios: 100 % — ABNORMAL HIGH (ref 21–57)
TIBC: 421 ug/dL (ref 236–444)
UIBC: 0 ug/dL — ABNORMAL LOW (ref 120–384)

## 2019-07-16 LAB — FERRITIN: Ferritin: 21 ng/mL (ref 11–307)

## 2019-07-16 NOTE — Telephone Encounter (Signed)
Appointments scheduled calendar mailed per 3/23 los

## 2019-07-17 ENCOUNTER — Encounter: Payer: Self-pay | Admitting: Family Medicine

## 2019-07-17 ENCOUNTER — Ambulatory Visit: Payer: PRIVATE HEALTH INSURANCE | Admitting: Hematology & Oncology

## 2019-07-17 ENCOUNTER — Other Ambulatory Visit: Payer: PRIVATE HEALTH INSURANCE

## 2019-07-17 NOTE — Telephone Encounter (Signed)
Form printed and placed in provider's box for completion.

## 2019-07-18 NOTE — Telephone Encounter (Signed)
Form completed placed in time his box to fax.  Please let patient know that is done.  Also please see if she has been able to recheck her blood pressure since she was last year when it was quite high.

## 2019-07-21 ENCOUNTER — Other Ambulatory Visit: Payer: Self-pay | Admitting: Hematology

## 2019-07-22 ENCOUNTER — Ambulatory Visit: Payer: PRIVATE HEALTH INSURANCE

## 2019-07-25 ENCOUNTER — Inpatient Hospital Stay: Payer: PRIVATE HEALTH INSURANCE

## 2019-07-28 ENCOUNTER — Inpatient Hospital Stay: Payer: PRIVATE HEALTH INSURANCE | Attending: Family

## 2019-07-28 ENCOUNTER — Other Ambulatory Visit: Payer: Self-pay | Admitting: *Deleted

## 2019-07-28 ENCOUNTER — Other Ambulatory Visit: Payer: Self-pay

## 2019-07-28 ENCOUNTER — Inpatient Hospital Stay: Payer: PRIVATE HEALTH INSURANCE

## 2019-07-28 VITALS — BP 134/89 | HR 82

## 2019-07-28 DIAGNOSIS — D509 Iron deficiency anemia, unspecified: Secondary | ICD-10-CM

## 2019-07-28 DIAGNOSIS — D500 Iron deficiency anemia secondary to blood loss (chronic): Secondary | ICD-10-CM | POA: Insufficient documentation

## 2019-07-28 DIAGNOSIS — N920 Excessive and frequent menstruation with regular cycle: Secondary | ICD-10-CM | POA: Insufficient documentation

## 2019-07-28 LAB — PREGNANCY, URINE: Preg Test, Ur: NEGATIVE

## 2019-07-28 MED ORDER — SODIUM CHLORIDE 0.9 % IV SOLN
Freq: Once | INTRAVENOUS | Status: AC
  Filled 2019-07-28: qty 250

## 2019-07-28 MED ORDER — SODIUM CHLORIDE 0.9 % IV SOLN
200.0000 mg | Freq: Once | INTRAVENOUS | Status: AC
  Administered 2019-07-28: 200 mg via INTRAVENOUS
  Filled 2019-07-28: qty 200

## 2019-07-28 NOTE — Patient Instructions (Signed)

## 2019-07-28 NOTE — Progress Notes (Signed)
Pharmacist Chemotherapy Monitoring - Follow Up Assessment    I verify that I have reviewed each item in the below checklist:  . Regimen for the patient is scheduled for the appropriate day and plan matches scheduled date. Marland Kitchen Appropriate non-routine labs are ordered dependent on drug ordered. . If applicable, additional medications reviewed and ordered per protocol based on lifetime cumulative doses and/or treatment regimen.   Plan for follow-up and/or issues identified: No . I-vent associated with next due treatment: No . MD and/or nursing notified: No  [name], [name] 07/28/2019 11:56 AM

## 2019-07-29 ENCOUNTER — Inpatient Hospital Stay: Payer: PRIVATE HEALTH INSURANCE

## 2019-07-31 ENCOUNTER — Inpatient Hospital Stay: Payer: PRIVATE HEALTH INSURANCE

## 2019-07-31 ENCOUNTER — Other Ambulatory Visit: Payer: Self-pay

## 2019-07-31 VITALS — BP 147/82 | HR 80 | Temp 97.6°F | Resp 18

## 2019-07-31 DIAGNOSIS — N920 Excessive and frequent menstruation with regular cycle: Secondary | ICD-10-CM | POA: Diagnosis not present

## 2019-07-31 DIAGNOSIS — D509 Iron deficiency anemia, unspecified: Secondary | ICD-10-CM

## 2019-07-31 MED ORDER — SODIUM CHLORIDE 0.9 % IV SOLN
Freq: Once | INTRAVENOUS | Status: AC
  Filled 2019-07-31: qty 250

## 2019-07-31 MED ORDER — SODIUM CHLORIDE 0.9 % IV SOLN
200.0000 mg | Freq: Once | INTRAVENOUS | Status: AC
  Administered 2019-07-31: 13:00:00 200 mg via INTRAVENOUS
  Filled 2019-07-31: qty 200

## 2019-07-31 NOTE — Patient Instructions (Signed)

## 2019-08-01 ENCOUNTER — Inpatient Hospital Stay: Payer: PRIVATE HEALTH INSURANCE

## 2019-08-04 ENCOUNTER — Inpatient Hospital Stay: Payer: PRIVATE HEALTH INSURANCE

## 2019-08-04 ENCOUNTER — Other Ambulatory Visit: Payer: Self-pay

## 2019-08-04 VITALS — BP 144/83 | HR 87 | Resp 17

## 2019-08-04 DIAGNOSIS — N920 Excessive and frequent menstruation with regular cycle: Secondary | ICD-10-CM | POA: Diagnosis not present

## 2019-08-04 DIAGNOSIS — D509 Iron deficiency anemia, unspecified: Secondary | ICD-10-CM

## 2019-08-04 MED ORDER — SODIUM CHLORIDE 0.9 % IV SOLN
200.0000 mg | Freq: Once | INTRAVENOUS | Status: AC
  Administered 2019-08-04: 200 mg via INTRAVENOUS
  Filled 2019-08-04: qty 200

## 2019-08-04 MED ORDER — SODIUM CHLORIDE 0.9 % IV SOLN
Freq: Once | INTRAVENOUS | Status: AC
  Filled 2019-08-04: qty 250

## 2019-08-04 NOTE — Progress Notes (Signed)
Pt declined to stay for post infusion observation period. Pt stated she has tolerated medicine prior and has done well with medication. Pt aware to call with any concerns or questions. Pt left ambulatory in no apparent distress.

## 2019-08-04 NOTE — Patient Instructions (Signed)

## 2019-08-05 ENCOUNTER — Ambulatory Visit: Payer: PRIVATE HEALTH INSURANCE

## 2019-08-07 ENCOUNTER — Inpatient Hospital Stay: Payer: PRIVATE HEALTH INSURANCE

## 2019-08-07 ENCOUNTER — Other Ambulatory Visit: Payer: Self-pay

## 2019-08-07 VITALS — BP 137/76 | HR 73 | Temp 97.0°F | Resp 18

## 2019-08-07 DIAGNOSIS — N920 Excessive and frequent menstruation with regular cycle: Secondary | ICD-10-CM | POA: Diagnosis not present

## 2019-08-07 DIAGNOSIS — D509 Iron deficiency anemia, unspecified: Secondary | ICD-10-CM

## 2019-08-07 MED ORDER — SODIUM CHLORIDE 0.9 % IV SOLN
200.0000 mg | Freq: Once | INTRAVENOUS | Status: AC
  Administered 2019-08-07: 200 mg via INTRAVENOUS
  Filled 2019-08-07: qty 10

## 2019-08-07 MED ORDER — SODIUM CHLORIDE 0.9 % IV SOLN
Freq: Once | INTRAVENOUS | Status: AC
  Filled 2019-08-07: qty 250

## 2019-08-07 NOTE — Patient Instructions (Signed)

## 2019-08-07 NOTE — Progress Notes (Signed)
Pt. Refused to wait 30 min. Post infusion. Released stable and ASX. 

## 2019-08-11 ENCOUNTER — Inpatient Hospital Stay: Payer: PRIVATE HEALTH INSURANCE

## 2019-08-11 ENCOUNTER — Other Ambulatory Visit: Payer: Self-pay

## 2019-08-11 VITALS — BP 132/84 | HR 73 | Temp 97.3°F | Resp 18

## 2019-08-11 DIAGNOSIS — N920 Excessive and frequent menstruation with regular cycle: Secondary | ICD-10-CM | POA: Diagnosis not present

## 2019-08-11 DIAGNOSIS — D509 Iron deficiency anemia, unspecified: Secondary | ICD-10-CM

## 2019-08-11 MED ORDER — SODIUM CHLORIDE 0.9 % IV SOLN
Freq: Once | INTRAVENOUS | Status: AC
  Filled 2019-08-11: qty 250

## 2019-08-11 MED ORDER — SODIUM CHLORIDE 0.9 % IV SOLN
200.0000 mg | Freq: Once | INTRAVENOUS | Status: AC
  Administered 2019-08-11: 200 mg via INTRAVENOUS
  Filled 2019-08-11: qty 200

## 2019-08-11 NOTE — Patient Instructions (Signed)

## 2019-08-11 NOTE — Progress Notes (Signed)
Patient did not want to wait the 30 minute observation period.  VSS.  Patient feels well without problems.

## 2019-09-03 ENCOUNTER — Other Ambulatory Visit: Payer: Self-pay | Admitting: Obstetrics & Gynecology

## 2019-09-03 DIAGNOSIS — B009 Herpesviral infection, unspecified: Secondary | ICD-10-CM

## 2019-09-16 ENCOUNTER — Ambulatory Visit (INDEPENDENT_AMBULATORY_CARE_PROVIDER_SITE_OTHER): Payer: PRIVATE HEALTH INSURANCE | Admitting: Family Medicine

## 2019-09-16 ENCOUNTER — Encounter: Payer: Self-pay | Admitting: Family Medicine

## 2019-09-16 ENCOUNTER — Other Ambulatory Visit: Payer: Self-pay

## 2019-09-16 VITALS — BP 137/78 | HR 105 | Ht 68.0 in | Wt 196.0 lb

## 2019-09-16 DIAGNOSIS — R81 Glycosuria: Secondary | ICD-10-CM | POA: Diagnosis not present

## 2019-09-16 DIAGNOSIS — N3001 Acute cystitis with hematuria: Secondary | ICD-10-CM

## 2019-09-16 DIAGNOSIS — R3915 Urgency of urination: Secondary | ICD-10-CM

## 2019-09-16 DIAGNOSIS — R319 Hematuria, unspecified: Secondary | ICD-10-CM

## 2019-09-16 LAB — POCT GLYCOSYLATED HEMOGLOBIN (HGB A1C): Hemoglobin A1C: 5.1 % (ref 4.0–5.6)

## 2019-09-16 LAB — POCT URINALYSIS DIP (CLINITEK)
Bilirubin, UA: NEGATIVE
Glucose, UA: 100 mg/dL — AB
Leukocytes, UA: NEGATIVE
Nitrite, UA: POSITIVE — AB
POC PROTEIN,UA: NEGATIVE
Spec Grav, UA: 1.025 (ref 1.010–1.025)
Urobilinogen, UA: 1 E.U./dL
pH, UA: 5 (ref 5.0–8.0)

## 2019-09-16 MED ORDER — SULFAMETHOXAZOLE-TRIMETHOPRIM 800-160 MG PO TABS
1.0000 | ORAL_TABLET | Freq: Two times a day (BID) | ORAL | 0 refills | Status: DC
Start: 2019-09-16 — End: 2019-12-08

## 2019-09-16 NOTE — Progress Notes (Signed)
=  sxs x 2 days. She has urgency, low abdominal pain, she noticed some blood when she wiped. She tried an OTC medication UriCalm .  Denies f/s/c/n/v/d

## 2019-09-16 NOTE — Progress Notes (Signed)
Acute Office Visit  Subjective:    Patient ID: [name], female    DOB: 1980-06-02, 38 y.o.   MRN: [id number]  Chief Complaint  Patient presents with  . Urinary Tract Infection    sxs x 2 days    HPI Patient is in today for urinary sxs 2 days ago.  + dysuria and pressure. Noticed blood in the urine. Used OTC Uricalm. That has helped some .  No fever, sweats, chills.  No significant back pain or discomfort.  Has not had a UTI since she was pregnant.  Past Medical History:  Diagnosis Date  . Abnormal Pap smear 2004   colposcopy  . Fibroids    myomectomy  . Herpes genitalia 2008   Last outbreak Nov 2012  . Hypertension    2009  . IBS (irritable bowel syndrome)   . Pre-eclampsia    2011  . Preeclampsia     Past Surgical History:  Procedure Laterality Date  . CESAREAN SECTION    . CESAREAN SECTION  10/16/2011   Procedure: CESAREAN SECTION;  Surgeon: Mora Bellman, MD;  Location: [facility];  Service: Gynecology;  Laterality: N/A;  . DILATION AND CURETTAGE OF UTERUS    . DILATION AND CURETTAGE OF UTERUS     x2  . DILATION AND CURETTAGE OF UTERUS     Miscarriages x 2  . LT breast biopsy    . MYOMECTOMY ABDOMINAL APPROACH      Family History  Problem Relation Age of Onset  . Alcohol abuse Father   . Hypertension Mother   . Breast cancer Maternal Aunt        2 maternal aunt  . Breast cancer Paternal Aunt   . Hypertension Maternal Aunt   . Heart attack Paternal Uncle        2 uncles  . Stroke Maternal Grandmother        TIA's  . Colon cancer Paternal Grandmother   . Stomach cancer Paternal Grandmother     Social History   Socioeconomic History  . Marital status: Single    Spouse name: Not on file  . Number of children: Not on file  . Years of education: Not on file  . Highest education level: Not on file  Occupational History  . Occupation: speech therapist  Tobacco Use  . Smoking status: Never Smoker  . Smokeless tobacco: Never Used  Substance and  Sexual Activity  . Alcohol use: Yes    Comment: rarely  . Drug use: No  . Sexual activity: Yes    Partners: Male    Birth control/protection: Condom, Pill    Comment: post baccalaurette degree, married, 1 girl, exercise twice weekly, no caffeine.  Other Topics Concern  . Not on file  Social History Narrative  . Not on file   Social Determinants of Health   Financial Resource Strain:   . Difficulty of Paying Living Expenses:   Food Insecurity:   . Worried About Charity fundraiser in the Last Year:   . Arboriculturist in the Last Year:   Transportation Needs:   . Film/video editor (Medical):   Marland Kitchen Lack of Transportation (Non-Medical):   Physical Activity:   . Days of Exercise per Week:   . Minutes of Exercise per Session:   Stress:   . Feeling of Stress :   Social Connections:   . Frequency of Communication with Friends and Family:   . Frequency of Social Gatherings with Friends and  Family:   . Attends Religious Services:   . Active Member of Clubs or Organizations:   . Attends Archivist Meetings:   Marland Kitchen Marital Status:   Intimate Partner Violence:   . Fear of Current or Ex-Partner:   . Emotionally Abused:   Marland Kitchen Physically Abused:   . Sexually Abused:     Outpatient Medications Prior to Visit  Medication Sig Dispense Refill  . amLODipine (NORVASC) 5 MG tablet Take 1 tablet (5 mg total) by mouth daily. 90 tablet 1  . fluticasone (FLONASE) 50 MCG/ACT nasal spray Place 2 sprays into both nostrils daily. 16 g 6  . hydrochlorothiazide (MICROZIDE) 12.5 MG capsule Take 1 capsule (12.5 mg total) by mouth daily. 90 capsule 1  . ibuprofen (ADVIL,MOTRIN) 800 MG tablet Take 1 tablet (800 mg total) by mouth every 8 (eight) hours as needed. 90 tablet 2  . Iron-FA-B Cmp-C-Biot-Probiotic (FUSION PLUS) CAPS Take 1 capsule by mouth daily. 30 capsule 3  . valACYclovir (VALTREX) 500 MG tablet Take 1 tablet (500 mg total) by mouth daily. 90 tablet 1   No facility-administered  medications prior to visit.    Allergies  Allergen Reactions  . Guaifenesin Rash  . Guaifenesin & Derivatives Rash    Review of Systems     Objective:    Physical Exam Vitals reviewed.  Constitutional:      Appearance: She is well-developed.  HENT:     Head: Normocephalic and atraumatic.  Eyes:     Conjunctiva/sclera: Conjunctivae normal.  Cardiovascular:     Rate and Rhythm: Normal rate.  Pulmonary:     Effort: Pulmonary effort is normal.  Skin:    General: Skin is dry.     Coloration: Skin is not pale.  Neurological:     Mental Status: She is alert and oriented to person, place, and time.  Psychiatric:        Behavior: Behavior normal.     BP 137/78   Pulse (!) 105   Ht 5\' 8"  (1.727 m)   Wt 196 lb (88.9 kg)   SpO2 100%   BMI 29.80 kg/m  Wt Readings from Last 3 Encounters:  09/16/19 196 lb (88.9 kg)  07/15/19 195 lb 1.3 oz (88.5 kg)  06/05/19 191 lb (86.6 kg)    There are no preventive care reminders to display for this patient.  There are no preventive care reminders to display for this patient.   Lab Results  Component Value Date   TSH 1.02 05/13/2018   Lab Results  Component Value Date   WBC 3.5 (L) 07/15/2019   HGB 11.6 (L) 07/15/2019   HCT 37.0 07/15/2019   MCV 85.1 07/15/2019   PLT 278 07/15/2019   Lab Results  Component Value Date   NA 136 07/15/2019   K 4.2 07/15/2019   CO2 29 07/15/2019   GLUCOSE 110 (H) 07/15/2019   BUN 16 07/15/2019   CREATININE 0.82 07/15/2019   BILITOT 0.3 07/15/2019   ALKPHOS 68 07/15/2019   AST 30 07/15/2019   ALT 23 07/15/2019   PROT 8.2 (H) 07/15/2019   ALBUMIN 4.3 07/15/2019   CALCIUM 9.8 07/15/2019   ANIONGAP 7 07/15/2019   Lab Results  Component Value Date   CHOL 146 06/06/2019   Lab Results  Component Value Date   HDL 60 06/06/2019   Lab Results  Component Value Date   LDLCALC 74 06/06/2019   Lab Results  Component Value Date   TRIG 43 06/06/2019   Lab  Results  Component Value  Date   CHOLHDL 2.4 06/06/2019   Lab Results  Component Value Date   HGBA1C 5.1 09/16/2019       Assessment & Plan:   Problem List Items Addressed This Visit    None    Visit Diagnoses    Urinary urgency    -  Primary   Relevant Orders   POCT URINALYSIS DIP (CLINITEK) (Completed)   Hematuria, unspecified type       Relevant Orders   POCT URINALYSIS DIP (CLINITEK) (Completed)   Acute cystitis with hematuria       Glucosuria       Relevant Orders   POCT glycosylated hemoglobin (Hb A1C) (Completed)     Urinary tract infection-we will go ahead and treat with Bactrim DS 1 tab twice a day x3 days.  Call if not significantly better by the end of the week.  Make sure hydrating well.  Glucosuria-hemoglobin A1c looks great today no sign of diabetes or prediabetes.  Meds ordered this encounter  Medications  . sulfamethoxazole-trimethoprim (BACTRIM DS) 800-160 MG tablet    Sig: Take 1 tablet by mouth 2 (two) times daily.    Dispense:  6 tablet    Refill:  0     Beatrice Lecher, MD

## 2019-09-16 NOTE — Patient Instructions (Signed)
Urinary Tract Infection, Adult A urinary tract infection (UTI) is an infection of any part of the urinary tract. The urinary tract includes:  The kidneys.  The ureters.  The bladder.  The urethra. These organs make, store, and get rid of pee (urine) in the body. What are the causes? This is caused by germs (bacteria) in your genital area. These germs grow and cause swelling (inflammation) of your urinary tract. What increases the risk? You are more likely to develop this condition if:  You have a small, thin tube (catheter) to drain pee.  You cannot control when you pee or poop (incontinence).  You are female, and: ? You use these methods to prevent pregnancy:  A medicine that kills sperm (spermicide).  A device that blocks sperm (diaphragm). ? You have low levels of a female hormone (estrogen). ? You are pregnant.  You have genes that add to your risk.  You are sexually active.  You take antibiotic medicines.  You have trouble peeing because of: ? A prostate that is bigger than normal, if you are female. ? A blockage in the part of your body that drains pee from the bladder (urethra). ? A kidney stone. ? A nerve condition that affects your bladder (neurogenic bladder). ? Not getting enough to drink. ? Not peeing often enough.  You have other conditions, such as: ? Diabetes. ? A weak disease-fighting system (immune system). ? Sickle cell disease. ? Gout. ? Injury of the spine. What are the signs or symptoms? Symptoms of this condition include:  Needing to pee right away (urgently).  Peeing often.  Peeing small amounts often.  Pain or burning when peeing.  Blood in the pee.  Pee that smells bad or not like normal.  Trouble peeing.  Pee that is cloudy.  Fluid coming from the vagina, if you are female.  Pain in the belly or lower back. Other symptoms include:  Throwing up (vomiting).  No urge to eat.  Feeling mixed up (confused).  Being tired  and grouchy (irritable).  A fever.  Watery poop (diarrhea). How is this treated? This condition may be treated with:  Antibiotic medicine.  Other medicines.  Drinking enough water. Follow these instructions at home:  Medicines  Take over-the-counter and prescription medicines only as told by your doctor.  If you were prescribed an antibiotic medicine, take it as told by your doctor. Do not stop taking it even if you start to feel better. General instructions  Make sure you: ? Pee until your bladder is empty. ? Do not hold pee for a long time. ? Empty your bladder after sex. ? Wipe from front to back after pooping if you are a female. Use each tissue one time when you wipe.  Drink enough fluid to keep your pee pale yellow.  Keep all follow-up visits as told by your doctor. This is important. Contact a doctor if:  You do not get better after 1-2 days.  Your symptoms go away and then come back. Get help right away if:  You have very bad back pain.  You have very bad pain in your lower belly.  You have a fever.  You are sick to your stomach (nauseous).  You are throwing up. Summary  A urinary tract infection (UTI) is an infection of any part of the urinary tract.  This condition is caused by germs in your genital area.  There are many risk factors for a UTI. These include having a small, thin   tube to drain pee and not being able to control when you pee or poop.  Treatment includes antibiotic medicines for germs.  Drink enough fluid to keep your pee pale yellow. This information is not intended to replace advice given to you by your health care provider. Make sure you discuss any questions you have with your health care provider. Document Revised: 03/28/2018 Document Reviewed: 10/18/2017 Elsevier Patient Education  2020 [employer]  

## 2019-10-15 ENCOUNTER — Inpatient Hospital Stay: Payer: PRIVATE HEALTH INSURANCE | Admitting: Family

## 2019-10-15 ENCOUNTER — Inpatient Hospital Stay: Payer: PRIVATE HEALTH INSURANCE

## 2019-12-02 ENCOUNTER — Ambulatory Visit: Payer: PRIVATE HEALTH INSURANCE | Admitting: Family Medicine

## 2019-12-04 ENCOUNTER — Ambulatory Visit: Payer: PRIVATE HEALTH INSURANCE | Admitting: Family Medicine

## 2019-12-08 ENCOUNTER — Ambulatory Visit (INDEPENDENT_AMBULATORY_CARE_PROVIDER_SITE_OTHER): Payer: BC Managed Care – PPO | Admitting: Family Medicine

## 2019-12-08 ENCOUNTER — Other Ambulatory Visit: Payer: Self-pay

## 2019-12-08 ENCOUNTER — Encounter: Payer: Self-pay | Admitting: Family Medicine

## 2019-12-08 VITALS — BP 131/71 | HR 76 | Ht 68.0 in | Wt 199.0 lb

## 2019-12-08 DIAGNOSIS — O3410 Maternal care for benign tumor of corpus uteri, unspecified trimester: Secondary | ICD-10-CM

## 2019-12-08 DIAGNOSIS — I10 Essential (primary) hypertension: Secondary | ICD-10-CM | POA: Diagnosis not present

## 2019-12-08 DIAGNOSIS — D259 Leiomyoma of uterus, unspecified: Secondary | ICD-10-CM | POA: Diagnosis not present

## 2019-12-08 NOTE — Progress Notes (Signed)
Established Patient Office Visit  Subjective:  Patient ID: [name], female    DOB: [birth year]  Age: 39 y.o. MRN: [id number]  CC:  Chief Complaint  Patient presents with  . Hypertension    HPI [name] presents for   Hypertension- Pt denies chest pain, SOB, dizziness, or heart palpitations.  Taking meds as directed w/o problems.  Denies medication side effects.  She has been getting systolic 409W at home.  She actually had fibroids ablated they are doing it intrauterine.  She says she is really been trying to exercise regularly but has not been able to lose weight.  She like to consider trying to get pregnant after she has the fibroids treated.   Past Medical History:  Diagnosis Date  . Abnormal Pap smear 2004   colposcopy  . Fibroids    myomectomy  . Herpes genitalia 2008   Last outbreak Nov 2012  . Hypertension    2009  . IBS (irritable bowel syndrome)   . Pre-eclampsia    2011  . Preeclampsia     Past Surgical History:  Procedure Laterality Date  . CESAREAN SECTION    . CESAREAN SECTION  10/16/2011   Procedure: CESAREAN SECTION;  Surgeon: Mora Bellman, MD;  Location: [facility];  Service: Gynecology;  Laterality: N/A;  . DILATION AND CURETTAGE OF UTERUS    . DILATION AND CURETTAGE OF UTERUS     x2  . DILATION AND CURETTAGE OF UTERUS     Miscarriages x 2  . LT breast biopsy    . MYOMECTOMY ABDOMINAL APPROACH      Family History  Problem Relation Age of Onset  . Alcohol abuse Father   . Hypertension Mother   . Breast cancer Maternal Aunt        2 maternal aunt  . Breast cancer Paternal Aunt   . Hypertension Maternal Aunt   . Heart attack Paternal Uncle        2 uncles  . Stroke Maternal Grandmother        TIA's  . Colon cancer Paternal Grandmother   . Stomach cancer Paternal Grandmother     Social History   Socioeconomic History  . Marital status: Single    Spouse name: Not on file  . Number of children: Not on file  . Years of  education: Not on file  . Highest education level: Not on file  Occupational History  . Occupation: speech therapist  Tobacco Use  . Smoking status: Never Smoker  . Smokeless tobacco: Never Used  Vaping Use  . Vaping Use: Never used  Substance and Sexual Activity  . Alcohol use: Yes    Comment: rarely  . Drug use: No  . Sexual activity: Yes    Partners: Male    Birth control/protection: Condom, Pill    Comment: post baccalaurette degree, married, 1 girl, exercise twice weekly, no caffeine.  Other Topics Concern  . Not on file  Social History Narrative  . Not on file   Social Determinants of Health   Financial Resource Strain:   . Difficulty of Paying Living Expenses:   Food Insecurity:   . Worried About Charity fundraiser in the Last Year:   . Arboriculturist in the Last Year:   Transportation Needs:   . Film/video editor (Medical):   Marland Kitchen Lack of Transportation (Non-Medical):   Physical Activity:   . Days of Exercise per Week:   . Minutes of Exercise per Session:  Stress:   . Feeling of Stress :   Social Connections:   . Frequency of Communication with Friends and Family:   . Frequency of Social Gatherings with Friends and Family:   . Attends Religious Services:   . Active Member of Clubs or Organizations:   . Attends Archivist Meetings:   Marland Kitchen Marital Status:   Intimate Partner Violence:   . Fear of Current or Ex-Partner:   . Emotionally Abused:   Marland Kitchen Physically Abused:   . Sexually Abused:     Outpatient Medications Prior to Visit  Medication Sig Dispense Refill  . amLODipine (NORVASC) 5 MG tablet Take 1 tablet (5 mg total) by mouth daily. 90 tablet 1  . fluticasone (FLONASE) 50 MCG/ACT nasal spray Place 2 sprays into both nostrils daily. 16 g 6  . hydrochlorothiazide (MICROZIDE) 12.5 MG capsule Take 1 capsule (12.5 mg total) by mouth daily. 90 capsule 1  . ibuprofen (ADVIL,MOTRIN) 800 MG tablet Take 1 tablet (800 mg total) by mouth every 8 (eight)  hours as needed. 90 tablet 2  . Iron-FA-B Cmp-C-Biot-Probiotic (FUSION PLUS) CAPS Take 1 capsule by mouth daily. 30 capsule 3  . valACYclovir (VALTREX) 500 MG tablet Take 1 tablet (500 mg total) by mouth daily. 90 tablet 1  . sulfamethoxazole-trimethoprim (BACTRIM DS) 800-160 MG tablet Take 1 tablet by mouth 2 (two) times daily. 6 tablet 0   No facility-administered medications prior to visit.    Allergies  Allergen Reactions  . Guaifenesin Rash  . Guaifenesin & Derivatives Rash    ROS Review of Systems    Objective:    Physical Exam Constitutional:      Appearance: She is well-developed.  HENT:     Head: Normocephalic and atraumatic.  Cardiovascular:     Rate and Rhythm: Normal rate and regular rhythm.     Heart sounds: Normal heart sounds.  Pulmonary:     Effort: Pulmonary effort is normal.     Breath sounds: Normal breath sounds.  Skin:    General: Skin is warm and dry.  Neurological:     Mental Status: She is alert and oriented to person, place, and time.  Psychiatric:        Behavior: Behavior normal.     BP 131/71   Pulse 76   Ht 5\' 8"  (1.727 m)   Wt 199 lb (90.3 kg)   SpO2 99%   BMI 30.26 kg/m  Wt Readings from Last 3 Encounters:  12/08/19 199 lb (90.3 kg)  09/16/19 196 lb (88.9 kg)  07/15/19 195 lb 1.3 oz (88.5 kg)     Health Maintenance Due  Topic Date Due  . Hepatitis C Screening  Never done  . INFLUENZA VACCINE  11/23/2019    There are no preventive care reminders to display for this patient.  Lab Results  Component Value Date   TSH 1.02 05/13/2018   Lab Results  Component Value Date   WBC 3.5 (L) 07/15/2019   HGB 11.6 (L) 07/15/2019   HCT 37.0 07/15/2019   MCV 85.1 07/15/2019   PLT 278 07/15/2019   Lab Results  Component Value Date   NA 136 07/15/2019   K 4.2 07/15/2019   CO2 29 07/15/2019   GLUCOSE 110 (H) 07/15/2019   BUN 16 07/15/2019   CREATININE 0.82 07/15/2019   BILITOT 0.3 07/15/2019   ALKPHOS 68 07/15/2019   AST  30 07/15/2019   ALT 23 07/15/2019   PROT 8.2 (H) 07/15/2019   ALBUMIN 4.3  07/15/2019   CALCIUM 9.8 07/15/2019   ANIONGAP 7 07/15/2019   Lab Results  Component Value Date   CHOL 146 06/06/2019   Lab Results  Component Value Date   HDL 60 06/06/2019   Lab Results  Component Value Date   LDLCALC 74 06/06/2019   Lab Results  Component Value Date   TRIG 43 06/06/2019   Lab Results  Component Value Date   CHOLHDL 2.4 06/06/2019   Lab Results  Component Value Date   HGBA1C 5.1 09/16/2019      Assessment & Plan:   Problem List Items Addressed This Visit      Cardiovascular and Mediastinum   HYPERTENSION, BENIGN - Primary    BP Today.  We discussed options.  We will increase her hydrochlorothiazide to 25 mg she just filled a 90-day supply so she will just take 2 for the next couple of weeks.  If her blood pressure looks great and will make the adjustment if it still not well controlled then she will let me know.  Otherwise I will see her back in 6 months.  She will need to go for BMP and I printed that lab order for her today in about 2 weeks if she decides to stick with the 2 capsules of hydrochlorothiazide.      Relevant Orders   BASIC METABOLIC PANEL WITH GFR     Genitourinary   Uterine fibroids affecting pregnancy    Planning on trying to get pregnant after she gets the fibroids treated.         Continue to work on healthy diet and regular exercise this will help lower the blood pressure as well.  No orders of the defined types were placed in this encounter.   Follow-up: Return in about 6 months (around 06/09/2020) for bp, Hypertension.   Time spent 20 minuetes  Beatrice Lecher, MD

## 2019-12-08 NOTE — Patient Instructions (Addendum)
Increase hctz to 2 tabs daily.  If doing well then go to the lab in 2 weeks.

## 2019-12-08 NOTE — Assessment & Plan Note (Signed)
Planning on trying to get pregnant after she gets the fibroids treated.

## 2019-12-08 NOTE — Assessment & Plan Note (Signed)
BP Today.  We discussed options.  We will increase her hydrochlorothiazide to 25 mg she just filled a 90-day supply so she will just take 2 for the next couple of weeks.  If her blood pressure looks great and will make the adjustment if it still not well controlled then she will let me know.  Otherwise I will see her back in 6 months.  She will need to go for BMP and I printed that lab order for her today in about 2 weeks if she decides to stick with the 2 capsules of hydrochlorothiazide.

## 2019-12-16 ENCOUNTER — Encounter: Payer: Self-pay | Admitting: *Deleted

## 2019-12-22 ENCOUNTER — Encounter: Payer: Self-pay | Admitting: Family Medicine

## 2019-12-22 ENCOUNTER — Telehealth (INDEPENDENT_AMBULATORY_CARE_PROVIDER_SITE_OTHER): Payer: BC Managed Care – PPO | Admitting: Family Medicine

## 2019-12-22 DIAGNOSIS — U071 COVID-19: Secondary | ICD-10-CM

## 2019-12-22 DIAGNOSIS — Z8616 Personal history of COVID-19: Secondary | ICD-10-CM | POA: Insufficient documentation

## 2019-12-22 NOTE — Assessment & Plan Note (Signed)
COVID-19-discussed symptoms.  Right now she is actually got very minimal symptoms and she is doing really well to difficult to say if she is really already been symptomatic for over a week or not.  As her test on Tuesday was not positive but her swab on Thursday was.  She is now quarantining and try to minimize her exposure to her children's.  Did give warning signs and symptoms to monitor for and to call us if at any point she has any problems or concerns.  She has been vaccinated which is fantastic

## 2019-12-22 NOTE — Progress Notes (Signed)
Virtual Visit via Video Note  I connected with [name] on 12/22/19 at  2:00 PM EDT by a video enabled telemedicine application and verified that I am speaking with the correct person using two identifiers.   I discussed the limitations of evaluation and management by telemedicine and the availability of in person appointments. The patient expressed understanding and agreed to proceed.  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 Had no SX when swabbed, tested due to outbreaks at work. Had felt sick August 16th & 19th, cough and congestion, but SX resolved with Zyrtec. Had surgery on the 19th and noticed a loss of smell, but smell came back after about 3 to 4 days.  Currently no fever, some congestion.  Worried about next steps and taking care of small children at home. Children don't have SX. she has been wearing her mask around them and try to social distance some.  But she is the primary caretaker.  So far they have been asymptomatic and she has scheduled for them to be tested sometime next week.  Right now she is feeling well.  States that may be a little bit of a mild runny nose and some sneezing but that is it no fevers or chills she said she did on Thursday that she was tested have a little bit of soreness in her neck and her back but that went away after about 2 days.  She supposed to quarantine until the night and then she is okay to return to work at that point.   Past medical history, Surgical history, Family history not pertinant except as noted below, Social history, Allergies, and medications have been entered into the medical record, reviewed, and corrections made.   Review of Systems: No fevers, chills, night sweats, weight loss, chest pain, or shortness of breath.   Objective:    General: Speaking clearly in complete sentences without  any shortness of breath.  Alert and oriented x3.  Normal judgment. No apparent acute distress.    Impression and Recommendations:    COVID-19 COVID-19-discussed symptoms.  Right now she is actually got very minimal symptoms and she is doing really well to difficult to say if she is really already been symptomatic for over a week or not.  As her test on Tuesday was not positive but her swab on Thursday was.  She is now quarantining and try to minimize her exposure to her children's.  Did give warning signs and symptoms to monitor for and to call us if at any point she has any problems or concerns.  She has been vaccinated which is fantastic      Time spent in encounter 16 minutes  I discussed the assessment and treatment plan with the patient. The patient was provided an opportunity to ask questions and all were answered. The patient agreed with the plan and demonstrated an understanding of the instructions.   The patient was advised to call back or seek an in-person evaluation if the symptoms worsen or if the condition fails to improve as anticipated.   Beatrice Lecher, MD

## 2019-12-22 NOTE — Progress Notes (Signed)
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 Had no SX when swabbed, tested due to outbreaks at work. Had felt sick August 16th & 17th, cough and congestion, but SX resolved with Zyrtec. Had surgery on the 19th and noticed a loss of smell, but smell came back.   Currently no fever, some congestion.  Worried about next steps and taking care of small children at home. Children don't have SX.

## 2019-12-23 ENCOUNTER — Encounter: Payer: Self-pay | Admitting: Family Medicine

## 2019-12-26 ENCOUNTER — Encounter: Payer: Self-pay | Admitting: Family Medicine

## 2019-12-26 ENCOUNTER — Ambulatory Visit (INDEPENDENT_AMBULATORY_CARE_PROVIDER_SITE_OTHER): Payer: BC Managed Care – PPO | Admitting: Family Medicine

## 2019-12-26 ENCOUNTER — Other Ambulatory Visit: Payer: Self-pay | Admitting: Family Medicine

## 2019-12-26 DIAGNOSIS — Z20822 Contact with and (suspected) exposure to covid-19: Secondary | ICD-10-CM

## 2019-12-26 NOTE — Progress Notes (Signed)
Lab only 

## 2019-12-26 NOTE — Progress Notes (Signed)
Agree with documentation as above.   Taimur Fier, MD  

## 2019-12-28 LAB — NOVEL CORONAVIRUS, NAA: SARS-CoV-2, NAA: NOT DETECTED

## 2020-02-27 ENCOUNTER — Encounter: Payer: Self-pay | Admitting: Family Medicine

## 2020-03-16 ENCOUNTER — Telehealth: Payer: Self-pay | Admitting: Family Medicine

## 2020-03-16 NOTE — Telephone Encounter (Signed)
Please call patient when STD paperwork is ready for pick up

## 2020-03-17 NOTE — Telephone Encounter (Signed)
Called pt and LVM informing her that I don't have any forms for her.   Asked that she send this to my email [email or web address]

## 2020-03-31 ENCOUNTER — Other Ambulatory Visit: Payer: Self-pay | Admitting: Family Medicine

## 2020-03-31 DIAGNOSIS — I10 Essential (primary) hypertension: Secondary | ICD-10-CM

## 2020-06-08 ENCOUNTER — Encounter: Payer: Self-pay | Admitting: Family Medicine

## 2020-06-08 ENCOUNTER — Other Ambulatory Visit: Payer: Self-pay

## 2020-06-08 ENCOUNTER — Ambulatory Visit (INDEPENDENT_AMBULATORY_CARE_PROVIDER_SITE_OTHER): Payer: BC Managed Care – PPO | Admitting: Family Medicine

## 2020-06-08 VITALS — BP 136/86 | HR 99 | Ht 68.0 in | Wt 203.0 lb

## 2020-06-08 DIAGNOSIS — I10 Essential (primary) hypertension: Secondary | ICD-10-CM

## 2020-06-08 DIAGNOSIS — G4700 Insomnia, unspecified: Secondary | ICD-10-CM | POA: Diagnosis not present

## 2020-06-08 DIAGNOSIS — D509 Iron deficiency anemia, unspecified: Secondary | ICD-10-CM

## 2020-06-08 DIAGNOSIS — R7301 Impaired fasting glucose: Secondary | ICD-10-CM

## 2020-06-08 DIAGNOSIS — N979 Female infertility, unspecified: Secondary | ICD-10-CM | POA: Diagnosis not present

## 2020-06-08 MED ORDER — HYDROCHLOROTHIAZIDE 12.5 MG PO CAPS: 12.5000 mg | ORAL_CAPSULE | Freq: Every day | ORAL | 1 refills | Status: DC

## 2020-06-08 MED ORDER — AMLODIPINE BESYLATE 5 MG PO TABS: 5.0000 mg | ORAL_TABLET | Freq: Every day | ORAL | 1 refills | Status: DC

## 2020-06-08 NOTE — Assessment & Plan Note (Signed)
Due to repeat hemoglobin A1c and when to keep a good eye on this.  She has gained some weight since she was last here.  Encouraged her to get back on track with healthy food choices and staying active.

## 2020-06-08 NOTE — Assessment & Plan Note (Signed)
Likely triggered by stress.  Reassured her it is okay to use the Zyrtec at bedtime if needed especially if it does help.  We also discussed cutting back on caffeine intake especially after lunchtime.  Working on setting a bedtime.

## 2020-06-08 NOTE — Assessment & Plan Note (Signed)
Did have her fibroids treated at the end of the summer but unfortunately she still having a lot of heavy bleeding with her periods

## 2020-06-08 NOTE — Assessment & Plan Note (Signed)
Pressure is borderline elevated today it is not exactly ideal.  We discussed that the goal is to keep her systolic under 103.  She said she will check it at home over the next few weeks and let me know if it still elevated or if it looks better.

## 2020-06-08 NOTE — Assessment & Plan Note (Signed)
He is working with an infertility specialist now they are actually considering IUI.

## 2020-06-08 NOTE — Progress Notes (Signed)
Established Patient Office Visit  Subjective:  Patient ID: [name], female    DOB: 06/04/1980  Age: 40 y.o. MRN: [id number]  CC:  Chief Complaint  Patient presents with  . Hypertension    HPI [name] presents for   Hypertension- Pt denies chest pain, SOB, dizziness, or heart palpitations.  Taking meds as directed w/o problems.  Denies medication side effects.  She said she does check her blood pressure at home every now and again though its been a little while since she is done so.  She says normally it looks good at home.  She has been under a lot of stress.  Working, taking care of the kids, running them to activities, she is in the midst of trying to buy a house.  And she is also undergoing infertility treatment and work-up she would like to try to get pregnant again.  She also reports that she is not sleeping well she says for maybe about 6 months she is just had a lot of difficulty with staying asleep she will usually wake up in the middle the night and then it takes her a while to fall back asleep occasionally she will take a Zyrtec at bedtime in part for allergies and in part because it does help her sleep and that usually works.  She is not tried taking anything else.  Past Medical History:  Diagnosis Date  . Abnormal Pap smear 2004   colposcopy  . Fibroids    myomectomy  . Herpes genitalia 2008   Last outbreak Nov 2012  . Hypertension    2009  . IBS (irritable bowel syndrome)   . Pre-eclampsia    2011  . Preeclampsia     Past Surgical History:  Procedure Laterality Date  . CESAREAN SECTION    . CESAREAN SECTION  10/16/2011   Procedure: CESAREAN SECTION;  Surgeon: Mora Bellman, MD;  Location: [facility];  Service: Gynecology;  Laterality: N/A;  . DILATION AND CURETTAGE OF UTERUS    . DILATION AND CURETTAGE OF UTERUS     x2  . DILATION AND CURETTAGE OF UTERUS     Miscarriages x 2  . LT breast biopsy    . MYOMECTOMY ABDOMINAL APPROACH       Family History  Problem Relation Age of Onset  . Alcohol abuse Father   . Hypertension Mother   . Breast cancer Maternal Aunt        2 maternal aunt  . Breast cancer Paternal Aunt   . Hypertension Maternal Aunt   . Heart attack Paternal Uncle        2 uncles  . Stroke Maternal Grandmother        TIA's  . Colon cancer Paternal Grandmother   . Stomach cancer Paternal Grandmother     Social History   Socioeconomic History  . Marital status: Single    Spouse name: Not on file  . Number of children: Not on file  . Years of education: Not on file  . Highest education level: Not on file  Occupational History  . Occupation: speech therapist  Tobacco Use  . Smoking status: Never Smoker  . Smokeless tobacco: Never Used  Vaping Use  . Vaping Use: Never used  Substance and Sexual Activity  . Alcohol use: Yes    Comment: rarely  . Drug use: No  . Sexual activity: Yes    Partners: Male    Birth control/protection: Condom, Pill    Comment: post baccalaurette  degree, married, 1 girl, exercise twice weekly, no caffeine.  Other Topics Concern  . Not on file  Social History Narrative  . Not on file   Social Determinants of Health   Financial Resource Strain: Not on file  Food Insecurity: Not on file  Transportation Needs: Not on file  Physical Activity: Not on file  Stress: Not on file  Social Connections: Not on file  Intimate Partner Violence: Not on file    Outpatient Medications Prior to Visit  Medication Sig Dispense Refill  . fluticasone (FLONASE) 50 MCG/ACT nasal spray Place 2 sprays into both nostrils daily. 16 g 6  . ibuprofen (ADVIL,MOTRIN) 800 MG tablet Take 1 tablet (800 mg total) by mouth every 8 (eight) hours as needed. 90 tablet 2  . Iron-FA-B Cmp-C-Biot-Probiotic (FUSION PLUS) CAPS Take 1 capsule by mouth once daily 30 capsule 11  . norethindrone (AYGESTIN) 5 MG tablet Take 5 mg by mouth every morning.    . valACYclovir (VALTREX) 500 MG tablet Take 1  tablet (500 mg total) by mouth daily. 90 tablet 1  . amLODipine (NORVASC) 5 MG tablet Take 1 tablet by mouth once daily 90 tablet 0  . hydrochlorothiazide (MICROZIDE) 12.5 MG capsule Take 1 capsule by mouth once daily 90 capsule 0   No facility-administered medications prior to visit.    Allergies  Allergen Reactions  . Guaifenesin Rash  . Guaifenesin & Derivatives Rash    ROS Review of Systems    Objective:    Physical Exam Constitutional:      Appearance: She is well-developed and well-nourished.  HENT:     Head: Normocephalic and atraumatic.  Cardiovascular:     Rate and Rhythm: Normal rate and regular rhythm.     Heart sounds: Normal heart sounds.  Pulmonary:     Effort: Pulmonary effort is normal.     Breath sounds: Normal breath sounds.  Skin:    General: Skin is warm and dry.  Neurological:     Mental Status: She is alert and oriented to person, place, and time.  Psychiatric:        Mood and Affect: Mood and affect normal.        Behavior: Behavior normal.     BP 136/86   Pulse 99   Ht 5\' 8"  (1.727 m)   Wt 203 lb (92.1 kg)   SpO2 100%   BMI 30.87 kg/m  Wt Readings from Last 3 Encounters:  06/08/20 203 lb (92.1 kg)  12/08/19 199 lb (90.3 kg)  09/16/19 196 lb (88.9 kg)     Health Maintenance Due  Topic Date Due  . Hepatitis C Screening  Never done  . PAP SMEAR-Modifier  06/12/2020    There are no preventive care reminders to display for this patient.  Lab Results  Component Value Date   TSH 1.02 05/13/2018   Lab Results  Component Value Date   WBC 3.5 (L) 07/15/2019   HGB 11.6 (L) 07/15/2019   HCT 37.0 07/15/2019   MCV 85.1 07/15/2019   PLT 278 07/15/2019   Lab Results  Component Value Date   NA 136 07/15/2019   K 4.2 07/15/2019   CO2 29 07/15/2019   GLUCOSE 110 (H) 07/15/2019   BUN 16 07/15/2019   CREATININE 0.82 07/15/2019   BILITOT 0.3 07/15/2019   ALKPHOS 68 07/15/2019   AST 30 07/15/2019   ALT 23 07/15/2019   PROT 8.2 (H)  07/15/2019   ALBUMIN 4.3 07/15/2019   CALCIUM 9.8 07/15/2019  ANIONGAP 7 07/15/2019   Lab Results  Component Value Date   CHOL 146 06/06/2019   Lab Results  Component Value Date   HDL 60 06/06/2019   Lab Results  Component Value Date   LDLCALC 74 06/06/2019   Lab Results  Component Value Date   TRIG 43 06/06/2019   Lab Results  Component Value Date   CHOLHDL 2.4 06/06/2019   Lab Results  Component Value Date   HGBA1C 5.1 09/16/2019      Assessment & Plan:   Problem List Items Addressed This Visit      Cardiovascular and Mediastinum   HYPERTENSION, BENIGN    Pressure is borderline elevated today it is not exactly ideal.  We discussed that the goal is to keep her systolic under 427.  She said she will check it at home over the next few weeks and let me know if it still elevated or if it looks better.      Relevant Medications   amLODipine (NORVASC) 5 MG tablet   hydrochlorothiazide (MICROZIDE) 12.5 MG capsule   Other Relevant Orders   COMPLETE METABOLIC PANEL WITH GFR   Lipid Panel w/reflex Direct LDL   Hemoglobin A1c     Endocrine   IFG (impaired fasting glucose) - Primary    Due to repeat hemoglobin A1c and when to keep a good eye on this.  She has gained some weight since she was last here.  Encouraged her to get back on track with healthy food choices and staying active.      Relevant Orders   COMPLETE METABOLIC PANEL WITH GFR   Lipid Panel w/reflex Direct LDL   Hemoglobin A1c     Genitourinary   Infertility, female    He is working with an infertility specialist now they are actually considering IUI.      Relevant Medications   norethindrone (AYGESTIN) 5 MG tablet     Other   Iron deficiency anemia    Did have her fibroids treated at the end of the summer but unfortunately she still having a lot of heavy bleeding with her periods      Insomnia    Likely triggered by stress.  Reassured her it is okay to use the Zyrtec at bedtime if needed  especially if it does help.  We also discussed cutting back on caffeine intake especially after lunchtime.  Working on setting a bedtime.         Meds ordered this encounter  Medications  . amLODipine (NORVASC) 5 MG tablet    Sig: Take 1 tablet (5 mg total) by mouth daily.    Dispense:  90 tablet    Refill:  1  . hydrochlorothiazide (MICROZIDE) 12.5 MG capsule    Sig: Take 1 capsule (12.5 mg total) by mouth daily.    Dispense:  90 capsule    Refill:  1    Follow-up: Return in about 6 months (around 12/06/2020) for Hypertension.    Beatrice Lecher, MD

## 2020-06-11 ENCOUNTER — Encounter: Payer: Self-pay | Admitting: Family Medicine

## 2020-06-11 DIAGNOSIS — Z6830 Body mass index (BMI) 30.0-30.9, adult: Secondary | ICD-10-CM

## 2020-06-14 NOTE — Telephone Encounter (Signed)
Ref placed.

## 2020-07-21 ENCOUNTER — Other Ambulatory Visit: Payer: Self-pay | Admitting: *Deleted

## 2021-01-17 ENCOUNTER — Other Ambulatory Visit: Payer: Self-pay | Admitting: *Deleted

## 2021-01-17 DIAGNOSIS — I10 Essential (primary) hypertension: Secondary | ICD-10-CM

## 2021-01-17 MED ORDER — AMLODIPINE BESYLATE 5 MG PO TABS
5.0000 mg | ORAL_TABLET | Freq: Every day | ORAL | 0 refills | Status: DC
Start: 2021-01-17 — End: 2021-05-13

## 2021-03-09 ENCOUNTER — Other Ambulatory Visit (HOSPITAL_BASED_OUTPATIENT_CLINIC_OR_DEPARTMENT_OTHER): Payer: Self-pay | Admitting: Family Medicine

## 2021-03-09 DIAGNOSIS — Z1231 Encounter for screening mammogram for malignant neoplasm of breast: Secondary | ICD-10-CM

## 2021-03-31 ENCOUNTER — Encounter: Payer: Self-pay | Admitting: Hematology

## 2021-03-31 ENCOUNTER — Emergency Department: Admit: 2021-03-31 | Discharge status: Absent without leave | Payer: Self-pay

## 2021-03-31 ENCOUNTER — Other Ambulatory Visit: Payer: Self-pay

## 2021-03-31 ENCOUNTER — Emergency Department (INDEPENDENT_AMBULATORY_CARE_PROVIDER_SITE_OTHER): Payer: No Typology Code available for payment source

## 2021-03-31 ENCOUNTER — Emergency Department
Admission: EM | Admit: 2021-03-31 | Discharge: 2021-03-31 | Disposition: A | Discharge status: Home / Self Care | Payer: No Typology Code available for payment source | Source: Home / Self Care | Attending: Family Medicine | Admitting: Family Medicine

## 2021-03-31 DIAGNOSIS — J069 Acute upper respiratory infection, unspecified: Secondary | ICD-10-CM

## 2021-03-31 DIAGNOSIS — R0981 Nasal congestion: Secondary | ICD-10-CM | POA: Diagnosis not present

## 2021-03-31 DIAGNOSIS — R059 Cough, unspecified: Secondary | ICD-10-CM

## 2021-03-31 DIAGNOSIS — J3089 Other allergic rhinitis: Secondary | ICD-10-CM

## 2021-03-31 MED ORDER — AZITHROMYCIN 250 MG PO TABS: ORAL_TABLET | ORAL | 0 refills | Status: DC

## 2021-03-31 MED ORDER — PREDNISONE 20 MG PO TABS: ORAL_TABLET | ORAL | 0 refills | Status: DC

## 2021-03-31 MED ORDER — GUAIFENESIN-CODEINE 100-10 MG/5ML PO SOLN: ORAL | 0 refills | Status: DC

## 2021-03-31 NOTE — ED Provider Notes (Signed)
[name]    CSN: [id number] Arrival date & time: 03/31/21  1417      History   Chief Complaint Chief Complaint  Patient presents with   Cough   Nasal Congestion   Sore Throat   Otalgia    HPI [name] is a 40 y.o. female.   Patient complains of seven day history of typical cold-like symptoms developing over several days, including sore throat, sinus congestion, fatigue, persistent chills, earache and cough. Patient was evaluated via a telehealth visit four days ago wherein she was prescribed Augmentin 875 BID.  Her cough became worse yesterday and she was prescribed Tessalon perles and Flonase spray.  She reports no improvement but denies pleuritic pain or shortness of breath. She has a history of perennial rhinitis.  The history is provided by the patient.   Past Medical History:  Diagnosis Date   Abnormal Pap smear 2004   colposcopy   Fibroids    myomectomy   Herpes genitalia 2008   Last outbreak Nov 2012   Hypertension    2009   IBS (irritable bowel syndrome)    Pre-eclampsia    2011   Preeclampsia     Patient Active Problem List   Diagnosis Date Noted   Infertility, female 06/08/2020   Insomnia 06/08/2020   History of COVID-19 12/22/2019   Iron deficiency anemia 08/17/2017   Acute intractable tension-type headache 02/26/2017   IFG (impaired fasting glucose) 10/21/2015   Family history of breast cancer 05/27/2012   Herpes genitalia 04/10/2011   Uterine fibroids affecting pregnancy 04/10/2011   H/O: C-section 02/25/2010   HYPERTENSION, BENIGN 07/30/2007    Past Surgical History:  Procedure Laterality Date   CESAREAN SECTION     CESAREAN SECTION  10/16/2011   Procedure: CESAREAN SECTION;  Surgeon: Mora Bellman, MD;  Location: [facility];  Service: Gynecology;  Laterality: N/A;   DILATION AND CURETTAGE OF UTERUS     DILATION AND CURETTAGE OF UTERUS     x2   DILATION AND CURETTAGE OF UTERUS     Miscarriages x 2   LT breast biopsy      MYOMECTOMY ABDOMINAL APPROACH      OB History     Gravida  4   Para  2   Term  1   Preterm  1   AB  2   Living  2      SAB  2   IAB      Ectopic      Multiple      Live Births  2            Home Medications    Prior to Admission medications   Medication Sig Start Date End Date Taking? Authorizing Provider  azithromycin (ZITHROMAX Z-PAK) 250 MG tablet Take 2 tabs today; then begin one tab once daily for 4 more days. 03/31/21  Yes Kandra Nicolas, MD  guaiFENesin-codeine 100-10 MG/5ML syrup Take 71mL by mouth at bedtime as needed for cough. 03/31/21  Yes Kandra Nicolas, MD  predniSONE (DELTASONE) 20 MG tablet Take one tab by mouth twice daily for 4 days, then one daily. Take with food. 03/31/21  Yes Kandra Nicolas, MD  amLODipine (NORVASC) 5 MG tablet Take 1 tablet (5 mg total) by mouth daily. 30 day supply given.please call office to schedule a follow up appointment for this medication for refills. 01/17/21   Hali Marry, MD  fluticasone (FLONASE) 50 MCG/ACT nasal spray Place 2 sprays  into both nostrils daily. 08/20/18   Hali Marry, MD  hydrochlorothiazide (MICROZIDE) 12.5 MG capsule Take 1 capsule (12.5 mg total) by mouth daily. 06/08/20   Hali Marry, MD  ibuprofen (ADVIL,MOTRIN) 800 MG tablet Take 1 tablet (800 mg total) by mouth every 8 (eight) hours as needed. 06/28/17   Guss Bunde, MD  Iron-FA-B Cmp-C-Biot-Probiotic (FUSION PLUS) CAPS Take 1 capsule by mouth once daily 12/26/19   Hali Marry, MD  norethindrone (AYGESTIN) 5 MG tablet Take 5 mg by mouth every morning. Patient not taking: Reported on 03/31/2021 05/31/20   Governor Specking, MD  valACYclovir Estell Harpin) 500 MG tablet Take 1 tablet by mouth once daily 07/21/20   Hali Marry, MD    Family History Family History  Problem Relation Age of Onset   Alcohol abuse Father    Hypertension Mother    Breast cancer Maternal Aunt        2 maternal aunt    Breast cancer Paternal Aunt    Hypertension Maternal Aunt    Heart attack Paternal Uncle        2 uncles   Stroke Maternal Grandmother        TIA's   Colon cancer Paternal Grandmother    Stomach cancer Paternal Grandmother     Social History Social History   Tobacco Use   Smoking status: Never   Smokeless tobacco: Never  Vaping Use   Vaping Use: Never used  Substance Use Topics   Alcohol use: Yes    Comment: rarely   Drug use: No     Allergies   Guaifenesin and Guaifenesin & derivatives   Review of Systems Review of Systems + sore throat + hoarse + cough No pleuritic pain No wheezing + nasal congestion + post-nasal drainage No sinus pain/pressure No itchy/red eyes + earache No hemoptysis No SOB No fever, + chills No nausea No vomiting No abdominal pain No diarrhea No urinary symptoms No skin rash + fatigue No myalgias No headache   Physical Exam Triage Vital Signs ED Triage Vitals  Enc Vitals Group     BP 03/31/21 1508 (!) 153/115     Pulse Rate 03/31/21 1508 85     Resp 03/31/21 1508 14     Temp 03/31/21 1508 98.7 F (37.1 C)     Temp Source 03/31/21 1508 Oral     SpO2 03/31/21 1508 100 %     Weight --      Height --      Head Circumference --      Peak Flow --      Pain Score 03/31/21 1509 0     Pain Loc --      Pain Edu? --      Excl. in [place]? --    No data found.  Updated Vital Signs BP (!) 153/115 (BP Location: Left Arm)   Pulse 85   Temp 98.7 F (37.1 C) (Oral)   Resp 14   LMP 03/23/2021   SpO2 100%   Visual Acuity Right Eye Distance:   Left Eye Distance:   Bilateral Distance:    Right Eye Near:   Left Eye Near:    Bilateral Near:     Physical Exam Nursing notes and Vital Signs reviewed. Appearance:  Patient appears stated age, and in no acute distress Eyes:  Pupils are equal, round, and reactive to light and accomodation.  Extraocular movement is intact.  Conjunctivae are not inflamed  Ears:  Canals normal.  Tympanic membranes normal.  Nose:  Mildly congested turbinates.  No sinus tenderness.   Pharynx:  Normal Neck:  Supple.  Mildly enlarged lateral nodes are present, tender to palpation on the left.   Lungs:  Clear to auscultation.  Breath sounds are equal.  Moving air well. Heart:  Regular rate and rhythm without murmurs, rubs, or gallops.  Abdomen:  Nontender without masses or hepatosplenomegaly.  Bowel sounds are present.  No CVA or flank tenderness.  Extremities:  No edema.  Skin:  No rash present.   UC Treatments / Results  Labs (all labs ordered are listed, but only abnormal results are displayed) Labs Reviewed - No data to display  EKG   Radiology DG Chest 2 View  Result Date: 03/31/2021 CLINICAL DATA:  Cough, congestion for 1 week EXAM: CHEST - 2 VIEW COMPARISON:  12/26/2013 FINDINGS: The heart size and mediastinal contours are within normal limits. Both lungs are clear. The visualized skeletal structures are unremarkable. IMPRESSION: No acute abnormality of the lungs. Electronically Signed   By: Delanna Ahmadi M.D.   On: 03/31/2021 16:45    Procedures Procedures (including critical care time)  Medications Ordered in UC Medications - No data to display  Initial Impression / Assessment and Plan / UC Course  I have reviewed the triage vital signs and the nursing notes.  Pertinent labs & imaging results that were available during my care of the patient were reviewed by me and considered in my medical decision making (see chart for details).    ?bronchitis.  Begin Z-pak and prednisone burst/taper. Rx for Robitussin [facility] for night time cough (patient states that she can take low dose guaifenesin). Followup with Family Doctor if not improved in one week.   Final Clinical Impressions(s) / UC Diagnoses   Final diagnoses:  Viral URI with cough  Perennial allergic rhinitis     Discharge Instructions      Increase fluid intake.  May use Afrin nasal spray (or generic  oxymetazoline) each morning for about 5 days and then discontinue.  Also recommend using saline nasal spray several times daily and saline nasal irrigation (AYR is a common brand).  Use Flonase nasal spray each morning after using Afrin nasal spray and saline nasal irrigation. Try warm salt water gargles for sore throat.  Stop all antihistamines for now, and other non-prescription cough/cold preparations. Stop Tessalon and Augmentin.    ED Prescriptions     Medication Sig Dispense Auth. Provider   azithromycin (ZITHROMAX Z-PAK) 250 MG tablet Take 2 tabs today; then begin one tab once daily for 4 more days. 6 tablet Kandra Nicolas, MD   predniSONE (DELTASONE) 20 MG tablet Take one tab by mouth twice daily for 4 days, then one daily. Take with food. 12 tablet Kandra Nicolas, MD   guaiFENesin-codeine 100-10 MG/5ML syrup Take 72mL by mouth at bedtime as needed for cough. 50 mL Kandra Nicolas, MD         Kandra Nicolas, MD 04/01/21 2029

## 2021-03-31 NOTE — ED Triage Notes (Signed)
Pt presents with cough, congestion, ear pain, and sore throat x 1 week. Pt states she was seen via telehealth and was prescribed an antibiotic and tesslon perles. Pt states she has not had improvement.

## 2021-03-31 NOTE — Discharge Instructions (Signed)
Increase fluid intake.  May use Afrin nasal spray (or generic oxymetazoline) each morning for about 5 days and then discontinue.  Also recommend using saline nasal spray several times daily and saline nasal irrigation (AYR is a common brand).  Use Flonase nasal spray each morning after using Afrin nasal spray and saline nasal irrigation. Try warm salt water gargles for sore throat.  Stop all antihistamines for now, and other non-prescription cough/cold preparations. Stop Tessalon and Augmentin.

## 2021-04-01 ENCOUNTER — Other Ambulatory Visit: Payer: Self-pay | Admitting: Family Medicine

## 2021-04-01 DIAGNOSIS — B009 Herpesviral infection, unspecified: Secondary | ICD-10-CM

## 2021-04-07 ENCOUNTER — Ambulatory Visit (INDEPENDENT_AMBULATORY_CARE_PROVIDER_SITE_OTHER): Payer: No Typology Code available for payment source

## 2021-04-07 ENCOUNTER — Other Ambulatory Visit: Payer: Self-pay

## 2021-04-07 DIAGNOSIS — Z1231 Encounter for screening mammogram for malignant neoplasm of breast: Secondary | ICD-10-CM | POA: Diagnosis not present

## 2021-04-12 NOTE — Progress Notes (Signed)
Please call patient. Normal mammogram.  Repeat in 1 year.  

## 2021-04-13 ENCOUNTER — Other Ambulatory Visit: Payer: Self-pay | Admitting: Family Medicine

## 2021-04-13 DIAGNOSIS — I10 Essential (primary) hypertension: Secondary | ICD-10-CM

## 2021-04-27 ENCOUNTER — Telehealth (INDEPENDENT_AMBULATORY_CARE_PROVIDER_SITE_OTHER): Payer: No Typology Code available for payment source | Admitting: Family Medicine

## 2021-04-27 ENCOUNTER — Encounter: Payer: Self-pay | Admitting: Family Medicine

## 2021-04-27 VITALS — Temp 98.7°F | Ht 68.0 in | Wt 198.0 lb

## 2021-04-27 DIAGNOSIS — J40 Bronchitis, not specified as acute or chronic: Secondary | ICD-10-CM

## 2021-04-27 DIAGNOSIS — J329 Chronic sinusitis, unspecified: Secondary | ICD-10-CM | POA: Diagnosis not present

## 2021-04-27 MED ORDER — PREDNISONE 20 MG PO TABS: 40.0000 mg | ORAL_TABLET | Freq: Every day | ORAL | 0 refills | Status: DC

## 2021-04-27 MED ORDER — GUAIFENESIN-CODEINE 100-10 MG/5ML PO SOLN: ORAL | 0 refills | Status: DC

## 2021-04-27 MED ORDER — AZITHROMYCIN 250 MG PO TABS: ORAL_TABLET | ORAL | 0 refills | Status: AC

## 2021-04-27 NOTE — Progress Notes (Signed)
° ° °  Virtual Visit via Video Note  I connected with [name] on 04/27/21 at 10:10 AM EST by a video enabled telemedicine application and verified that I am speaking with the correct person using two identifiers.   I discussed the limitations of evaluation and management by telemedicine and the availability of in person appointments. The patient expressed understanding and agreed to proceed.  Patient location: at work Provider location: in office  Subjective:    CC:   Chief Complaint  Patient presents with   Cough    Productive cough, runny nose, wheezing, shortness of breath, chest congestion, body aches and chills for 4 days. Covid test negative 2 days ago.     HPI:  Productive cough, runny nose, wheezing, shortness of breath, chest congestion, body aches and chills for 4 days. Covid test negative 2 days ago. Dec appetite.  + nausea and vomited once.  Negative for flu at work.  She works in a nursing home.. Using nighttime cough time. No known sick contracts.  A lot of sinus pressure and some congestion.    Past medical history, Surgical history, Family history not pertinant except as noted below, Social history, Allergies, and medications have been entered into the medical record, reviewed, and corrections made.    Objective:    General: Speaking clearly in complete sentences without any shortness of breath.  Alert and oriented x3.  Normal judgment. No apparent acute distress.    Impression and Recommendations:    Problem List Items Addressed This Visit   None Visit Diagnoses     Sinobronchitis    -  Primary   Relevant Medications   predniSONE (DELTASONE) 20 MG tablet   guaiFENesin-codeine 100-10 MG/5ML syrup   azithromycin (ZITHROMAX) 250 MG tablet      We discussed that the illness may still be viral at this point as she is only had symptoms for 4 days.  Recommend starting with the prednisone and cough syrup at night.  And if she is not improving by this  weekend then okay to start the antibiotic but again just reassured her that this could still be viral.  No orders of the defined types were placed in this encounter.   Meds ordered this encounter  Medications   predniSONE (DELTASONE) 20 MG tablet    Sig: Take 2 tablets (40 mg total) by mouth daily with breakfast.    Dispense:  10 tablet    Refill:  0   guaiFENesin-codeine 100-10 MG/5ML syrup    Sig: Take 48mL by mouth at bedtime as needed for cough.    Dispense:  50 mL    Refill:  0   azithromycin (ZITHROMAX) 250 MG tablet    Sig: 2 Ttabs PO on Day 1, then one a day x 4 days.    Dispense:  6 tablet    Refill:  0     I discussed the assessment and treatment plan with the patient. The patient was provided an opportunity to ask questions and all were answered. The patient agreed with the plan and demonstrated an understanding of the instructions.   The patient was advised to call back or seek an in-person evaluation if the symptoms worsen or if the condition fails to improve as anticipated.   Beatrice Lecher, MD

## 2021-05-13 ENCOUNTER — Other Ambulatory Visit: Payer: Self-pay | Admitting: Family Medicine

## 2021-05-13 DIAGNOSIS — I10 Essential (primary) hypertension: Secondary | ICD-10-CM

## 2021-05-28 ENCOUNTER — Other Ambulatory Visit: Payer: Self-pay | Admitting: Family Medicine

## 2021-05-28 DIAGNOSIS — B009 Herpesviral infection, unspecified: Secondary | ICD-10-CM

## 2021-06-11 ENCOUNTER — Other Ambulatory Visit: Payer: Self-pay | Admitting: Obstetrics and Gynecology

## 2021-06-12 ENCOUNTER — Other Ambulatory Visit: Payer: Self-pay | Admitting: Family Medicine

## 2021-06-12 DIAGNOSIS — I10 Essential (primary) hypertension: Secondary | ICD-10-CM

## 2021-06-21 ENCOUNTER — Encounter (HOSPITAL_COMMUNITY)
Admission: RE | Admit: 2021-06-21 | Discharge: 2021-06-21 | Disposition: A | Discharge status: Home / Self Care | Payer: No Typology Code available for payment source | Source: Ambulatory Visit | Attending: Obstetrics and Gynecology | Admitting: Obstetrics and Gynecology

## 2021-06-21 ENCOUNTER — Other Ambulatory Visit: Payer: Self-pay

## 2021-06-21 DIAGNOSIS — I10 Essential (primary) hypertension: Secondary | ICD-10-CM | POA: Diagnosis not present

## 2021-06-21 DIAGNOSIS — Z01818 Encounter for other preprocedural examination: Secondary | ICD-10-CM | POA: Insufficient documentation

## 2021-06-21 LAB — CBC
HCT: 25.3 % — ABNORMAL LOW (ref 36.0–46.0)
Hemoglobin: 7.6 g/dL — ABNORMAL LOW (ref 12.0–15.0)
MCH: 22.1 pg — ABNORMAL LOW (ref 26.0–34.0)
MCHC: 30 g/dL (ref 30.0–36.0)
MCV: 73.5 fL — ABNORMAL LOW (ref 80.0–100.0)
Platelets: 381 10*3/uL (ref 150–400)
RBC: 3.44 MIL/uL — ABNORMAL LOW (ref 3.87–5.11)
RBC: 3.44 — AB (ref 3.87–5.11)
RDW: 17.1 % — ABNORMAL HIGH (ref 11.5–15.5)
WBC: 3 10*3/uL — ABNORMAL LOW (ref 4.0–10.5)
nRBC: 0 % (ref 0.0–0.2)

## 2021-06-21 LAB — CBC AND DIFFERENTIAL
HCT: 25 — AB (ref 36–46)
Hemoglobin: 7.6 — AB (ref 12.0–16.0)
Platelets: 381 10*3/uL (ref 150–400)
WBC: 3

## 2021-06-21 LAB — BASIC METABOLIC PANEL WITH GFR
BUN: 15 (ref 4–21)
CO2: 23 — AB (ref 13–22)
Chloride: 102 (ref 99–108)
Creatinine: 0.7 (ref 0.5–1.1)
Glucose: 97
Potassium: 3.6 meq/L (ref 3.5–5.1)
Sodium: 132 — AB (ref 137–147)

## 2021-06-21 LAB — BASIC METABOLIC PANEL
Anion gap: 7 (ref 5–15)
BUN: 15 mg/dL (ref 6–20)
CO2: 23 mmol/L (ref 22–32)
Calcium: 8.8 mg/dL — ABNORMAL LOW (ref 8.9–10.3)
Chloride: 102 mmol/L (ref 98–111)
Creatinine, Ser: 0.65 mg/dL (ref 0.44–1.00)
GFR, Estimated: >60 mL/min (ref >60)
Glucose, Bld: 97 mg/dL (ref 70–99)
Potassium: 3.6 mmol/L (ref 3.5–5.1)
Sodium: 132 mmol/L — ABNORMAL LOW (ref 135–145)

## 2021-06-21 LAB — COMPREHENSIVE METABOLIC PANEL
Calcium: 8.8 (ref 8.7–10.7)
eGFR: 60

## 2021-06-21 NOTE — Progress Notes (Signed)
Spoke with dr cousins pt is to get 2 units of blood and pt is to come back for a cbc recheck day before surgery on 06-30-2021.

## 2021-06-21 NOTE — Congregational Nurse Program (Signed)
Spoke with dr cousins and made aware hemaglobin was 7.6  and wbc was 3.0 at labs done today. Dr Dorina Hoyer to call back with follow up orders in am.

## 2021-06-22 NOTE — Progress Notes (Signed)
Pt getting 2 units blood 06-23-2021 per kimya at dr cousins office

## 2021-06-23 ENCOUNTER — Other Ambulatory Visit: Payer: Self-pay

## 2021-06-23 ENCOUNTER — Other Ambulatory Visit: Payer: Self-pay | Admitting: Family Medicine

## 2021-06-23 ENCOUNTER — Ambulatory Visit (HOSPITAL_COMMUNITY)
Admission: RE | Admit: 2021-06-23 | Discharge: 2021-06-23 | Disposition: A | Discharge status: Home / Self Care | Payer: No Typology Code available for payment source | Source: Ambulatory Visit | Attending: Obstetrics and Gynecology | Admitting: Obstetrics and Gynecology

## 2021-06-23 DIAGNOSIS — D509 Iron deficiency anemia, unspecified: Secondary | ICD-10-CM | POA: Insufficient documentation

## 2021-06-23 DIAGNOSIS — B009 Herpesviral infection, unspecified: Secondary | ICD-10-CM

## 2021-06-23 DIAGNOSIS — N920 Excessive and frequent menstruation with regular cycle: Secondary | ICD-10-CM | POA: Diagnosis not present

## 2021-06-23 DIAGNOSIS — Z9289 Personal history of other medical treatment: Secondary | ICD-10-CM

## 2021-06-23 HISTORY — DX: Personal history of other medical treatment: Z92.89

## 2021-06-23 LAB — PREPARE RBC (CROSSMATCH)

## 2021-06-23 LAB — ABO/RH: ABO/RH(D): O POS

## 2021-06-23 MED ORDER — SODIUM CHLORIDE 0.9% IV SOLUTION: Freq: Once | INTRAVENOUS | Status: DC

## 2021-06-24 LAB — TYPE AND SCREEN
ABO/RH(D): O POS
Antibody Screen: NEGATIVE
Unit division: 0
Unit division: 0

## 2021-06-24 LAB — BPAM RBC
Blood Product Expiration Date: 202303272359
Blood Product Expiration Date: 202303282359
ISSUE DATE / TIME: 202303021049
ISSUE DATE / TIME: 202303021306
Unit Type and Rh: 5100
Unit Type and Rh: 5100

## 2021-06-27 ENCOUNTER — Encounter (HOSPITAL_BASED_OUTPATIENT_CLINIC_OR_DEPARTMENT_OTHER): Payer: Self-pay | Admitting: Obstetrics and Gynecology

## 2021-06-28 ENCOUNTER — Other Ambulatory Visit: Payer: Self-pay

## 2021-06-28 ENCOUNTER — Encounter (HOSPITAL_BASED_OUTPATIENT_CLINIC_OR_DEPARTMENT_OTHER): Payer: Self-pay | Admitting: Obstetrics and Gynecology

## 2021-06-28 NOTE — Progress Notes (Addendum)
Spoke w/ via phone for pre-op interview---pt ?Lab needs dos----  urine preg             ?Lab results------bmp 06-21-2021 epic, ekg 06-21-2021 chart/epic, chest xray 03-31-2021 epic, lab appt for cbc 06-30-2021 830 am ?COVID test -----patient states asymptomatic no test needed ?Arrive at -------845 07-01-2021 ?NPO after MN NO Solid Food.  Clear liquids from MN until---745 am ?Med rec completed ?Medications to take morning of surgery -----amlodipine, flonase prn, valtrex ?Diabetic medication -----n/a ?Patient instructed no nail polish to be worn day of surgery ?Patient instructed to bring photo id and insurance card day of surgery ?Patient aware to have Driver (ride ) / caregiver  mother Kathe Mariner will stay   for 24 hours after surgery  ?Patient Special Instructions -----none ?Pre-Op special Istructions -----none ?Patient verbalized understanding of instructions that were given at this phone interview. ?Patient denies shortness of breath, chest pain, fever, cough at this phone interview.  ? ?Addendum: spoke with dr cousin by phone and pt does not need type and screen done day of surgery. ?

## 2021-06-30 ENCOUNTER — Encounter (HOSPITAL_COMMUNITY)
Admission: RE | Admit: 2021-06-30 | Discharge: 2021-06-30 | Disposition: A | Discharge status: Home / Self Care | Payer: No Typology Code available for payment source | Source: Ambulatory Visit | Attending: Obstetrics and Gynecology | Admitting: Obstetrics and Gynecology

## 2021-06-30 ENCOUNTER — Other Ambulatory Visit: Payer: Self-pay

## 2021-06-30 DIAGNOSIS — D500 Iron deficiency anemia secondary to blood loss (chronic): Secondary | ICD-10-CM | POA: Insufficient documentation

## 2021-06-30 DIAGNOSIS — Z01812 Encounter for preprocedural laboratory examination: Secondary | ICD-10-CM | POA: Insufficient documentation

## 2021-06-30 LAB — CBC
HCT: 33.2 % — ABNORMAL LOW (ref 36.0–46.0)
Hemoglobin: 9.7 g/dL — ABNORMAL LOW (ref 12.0–15.0)
MCH: 22.8 pg — ABNORMAL LOW (ref 26.0–34.0)
MCHC: 29.2 g/dL — ABNORMAL LOW (ref 30.0–36.0)
MCV: 77.9 fL — ABNORMAL LOW (ref 80.0–100.0)
Platelets: 370 10*3/uL (ref 150–400)
RBC: 4.26 MIL/uL (ref 3.87–5.11)
RDW: 19.4 % — ABNORMAL HIGH (ref 11.5–15.5)
WBC: 4.7 10*3/uL (ref 4.0–10.5)
nRBC: 0 % (ref 0.0–0.2)

## 2021-07-01 ENCOUNTER — Other Ambulatory Visit: Payer: Self-pay

## 2021-07-01 ENCOUNTER — Encounter (HOSPITAL_BASED_OUTPATIENT_CLINIC_OR_DEPARTMENT_OTHER): Payer: Self-pay | Admitting: Obstetrics and Gynecology

## 2021-07-01 ENCOUNTER — Ambulatory Visit (HOSPITAL_BASED_OUTPATIENT_CLINIC_OR_DEPARTMENT_OTHER): Payer: No Typology Code available for payment source | Admitting: Anesthesiology

## 2021-07-01 ENCOUNTER — Encounter (HOSPITAL_BASED_OUTPATIENT_CLINIC_OR_DEPARTMENT_OTHER)
Admission: RE | Disposition: A | Discharge status: Home / Self Care | Payer: Self-pay | Source: Ambulatory Visit | Attending: Obstetrics and Gynecology

## 2021-07-01 ENCOUNTER — Ambulatory Visit (HOSPITAL_BASED_OUTPATIENT_CLINIC_OR_DEPARTMENT_OTHER)
Admission: RE | Admit: 2021-07-01 | Discharge: 2021-07-01 | Disposition: A | Discharge status: Home / Self Care | Payer: No Typology Code available for payment source | Source: Ambulatory Visit | Attending: Obstetrics and Gynecology | Admitting: Obstetrics and Gynecology

## 2021-07-01 DIAGNOSIS — I10 Essential (primary) hypertension: Secondary | ICD-10-CM | POA: Insufficient documentation

## 2021-07-01 DIAGNOSIS — D252 Subserosal leiomyoma of uterus: Secondary | ICD-10-CM | POA: Diagnosis not present

## 2021-07-01 DIAGNOSIS — D509 Iron deficiency anemia, unspecified: Secondary | ICD-10-CM | POA: Diagnosis not present

## 2021-07-01 DIAGNOSIS — D259 Leiomyoma of uterus, unspecified: Secondary | ICD-10-CM | POA: Diagnosis present

## 2021-07-01 DIAGNOSIS — D500 Iron deficiency anemia secondary to blood loss (chronic): Secondary | ICD-10-CM

## 2021-07-01 DIAGNOSIS — D251 Intramural leiomyoma of uterus: Secondary | ICD-10-CM | POA: Diagnosis not present

## 2021-07-01 DIAGNOSIS — D250 Submucous leiomyoma of uterus: Secondary | ICD-10-CM | POA: Insufficient documentation

## 2021-07-01 DIAGNOSIS — N920 Excessive and frequent menstruation with regular cycle: Secondary | ICD-10-CM | POA: Insufficient documentation

## 2021-07-01 HISTORY — DX: Excessive and frequent menstruation with regular cycle: N92.0

## 2021-07-01 HISTORY — DX: Presence of spectacles and contact lenses: Z97.3

## 2021-07-01 HISTORY — DX: Anemia, unspecified: D64.9

## 2021-07-01 HISTORY — PX: DILATATION & CURETTAGE/HYSTEROSCOPY WITH MYOSURE: SHX6511

## 2021-07-01 LAB — POCT PREGNANCY, URINE: Preg Test, Ur: NEGATIVE

## 2021-07-01 SURGERY — RADIOFREQUENCY ABLATION, LEIOMYOMA, UTERUS, TRANSCERVICAL APPROACH, WITH US GUIDANCE
Anesthesia: General | Site: Uterus

## 2021-07-01 MED ORDER — FENTANYL CITRATE (PF) 100 MCG/2ML IJ SOLN
INTRAMUSCULAR | Status: DC | PRN
  Administered 2021-07-01: 25 ug via INTRAVENOUS
  Administered 2021-07-01: 50 ug via INTRAVENOUS
  Administered 2021-07-01: 25 ug via INTRAVENOUS

## 2021-07-01 MED ORDER — PROPOFOL 10 MG/ML IV BOLUS
INTRAVENOUS | Status: DC | PRN
  Administered 2021-07-01: 200 mg via INTRAVENOUS

## 2021-07-01 MED ORDER — LIDOCAINE HCL (CARDIAC) PF 100 MG/5ML IV SOSY
PREFILLED_SYRINGE | INTRAVENOUS | Status: DC | PRN
  Administered 2021-07-01: 60 mg via INTRAVENOUS

## 2021-07-01 MED ORDER — MIDAZOLAM HCL 2 MG/2ML IJ SOLN
INTRAMUSCULAR | Status: AC
  Filled 2021-07-01: qty 2

## 2021-07-01 MED ORDER — FENTANYL CITRATE (PF) 100 MCG/2ML IJ SOLN
INTRAMUSCULAR | Status: AC
  Filled 2021-07-01: qty 2

## 2021-07-01 MED ORDER — LIDOCAINE HCL (PF) 2 % IJ SOLN
INTRAMUSCULAR | Status: AC
  Filled 2021-07-01: qty 15

## 2021-07-01 MED ORDER — EPHEDRINE SULFATE (PRESSORS) 50 MG/ML IJ SOLN
INTRAMUSCULAR | Status: DC | PRN
  Administered 2021-07-01: 5 mg via INTRAVENOUS

## 2021-07-01 MED ORDER — EPHEDRINE 5 MG/ML INJ
INTRAVENOUS | Status: AC
  Filled 2021-07-01: qty 5

## 2021-07-01 MED ORDER — ACETAMINOPHEN 500 MG PO TABS
1000.0000 mg | ORAL_TABLET | Freq: Once | ORAL | Status: AC
  Administered 2021-07-01: 1000 mg via ORAL

## 2021-07-01 MED ORDER — STERILE WATER FOR IRRIGATION IR SOLN
Status: DC | PRN
  Administered 2021-07-01: 500 mL

## 2021-07-01 MED ORDER — KETOROLAC TROMETHAMINE 30 MG/ML IJ SOLN
INTRAMUSCULAR | Status: DC | PRN
  Administered 2021-07-01: 30 mg via INTRAVENOUS

## 2021-07-01 MED ORDER — ONDANSETRON HCL 4 MG/2ML IJ SOLN
INTRAMUSCULAR | Status: DC | PRN
  Administered 2021-07-01: 4 mg via INTRAVENOUS

## 2021-07-01 MED ORDER — SCOPOLAMINE 1 MG/3DAYS TD PT72
MEDICATED_PATCH | TRANSDERMAL | Status: AC
Start: 2021-07-01 — End: ?
  Filled 2021-07-01: qty 1

## 2021-07-01 MED ORDER — PHENYLEPHRINE HCL (PRESSORS) 10 MG/ML IV SOLN
INTRAVENOUS | Status: DC | PRN
  Administered 2021-07-01 (×3): 80 ug via INTRAVENOUS

## 2021-07-01 MED ORDER — SCOPOLAMINE 1 MG/3DAYS TD PT72
1.0000 | MEDICATED_PATCH | TRANSDERMAL | Status: DC
  Administered 2021-07-01: 1.5 mg via TRANSDERMAL

## 2021-07-01 MED ORDER — SODIUM CHLORIDE 0.9 % IR SOLN
Status: DC | PRN
  Administered 2021-07-01: 3000 mL

## 2021-07-01 MED ORDER — LACTATED RINGERS IV SOLN: INTRAVENOUS | Status: DC

## 2021-07-01 MED ORDER — TRAMADOL HCL 50 MG PO TABS: 50.0000 mg | ORAL_TABLET | Freq: Four times a day (QID) | ORAL | 0 refills | Status: AC | PRN

## 2021-07-01 MED ORDER — GLYCOPYRROLATE PF 0.2 MG/ML IJ SOSY
PREFILLED_SYRINGE | INTRAMUSCULAR | Status: AC
  Filled 2021-07-01: qty 3

## 2021-07-01 MED ORDER — POVIDONE-IODINE 10 % EX SWAB: 2.0000 "application " | Freq: Once | CUTANEOUS | Status: DC

## 2021-07-01 MED ORDER — MIDAZOLAM HCL 2 MG/2ML IJ SOLN
INTRAMUSCULAR | Status: DC | PRN
Start: 2021-07-01 — End: 2021-07-01
  Administered 2021-07-01: 2 mg via INTRAVENOUS

## 2021-07-01 MED ORDER — PHENYLEPHRINE 40 MCG/ML (10ML) SYRINGE FOR IV PUSH (FOR BLOOD PRESSURE SUPPORT)
PREFILLED_SYRINGE | INTRAVENOUS | Status: AC
  Filled 2021-07-01: qty 10

## 2021-07-01 MED ORDER — DEXAMETHASONE SODIUM PHOSPHATE 4 MG/ML IJ SOLN
INTRAMUSCULAR | Status: DC | PRN
  Administered 2021-07-01: 10 mg via INTRAVENOUS

## 2021-07-01 MED ORDER — ACETAMINOPHEN 500 MG PO TABS
ORAL_TABLET | ORAL | Status: AC
  Filled 2021-07-01: qty 2

## 2021-07-01 SURGICAL SUPPLY — 26 items
DEVICE MYOSURE LITE (MISCELLANEOUS) IMPLANT
DEVICE MYOSURE REACH (MISCELLANEOUS) IMPLANT
DILATOR CANAL MILEX (MISCELLANEOUS) IMPLANT
DRSG TELFA 3X8 NADH (GAUZE/BANDAGES/DRESSINGS) ×3 IMPLANT
ELECT DISPERSIVE SONATA (ELECTRODE) ×6 IMPLANT
ELECT REM PT RETURN 9FT ADLT (ELECTROSURGICAL) ×3
ELECTRODE REM PT RTRN 9FT ADLT (ELECTROSURGICAL) ×1 IMPLANT
GAUZE 4X4 16PLY ~~LOC~~+RFID DBL (SPONGE) ×3 IMPLANT
GLOVE SURG LTX SZ6.5 (GLOVE) ×3 IMPLANT
GLOVE SURG UNDER POLY LF SZ7 (GLOVE) ×3 IMPLANT
GOWN STRL REUS W/TWL LRG LVL3 (GOWN DISPOSABLE) ×3 IMPLANT
HANDPIECE RFA SONATA (MISCELLANEOUS) ×3 IMPLANT
IV NS IRRIG 3000ML ARTHROMATIC (IV SOLUTION) ×3 IMPLANT
KIT PROCEDURE FLUENT (KITS) ×3 IMPLANT
KIT TURNOVER CYSTO (KITS) ×3 IMPLANT
MYOSURE XL FIBROID (MISCELLANEOUS) ×3
PACK VAGINAL MINOR WOMEN LF (CUSTOM PROCEDURE TRAY) ×3 IMPLANT
PAD DRESSING TELFA 3X8 NADH (GAUZE/BANDAGES/DRESSINGS) ×1 IMPLANT
PAD OB MATERNITY 4.3X12.25 (PERSONAL CARE ITEMS) ×3 IMPLANT
PAD PREP 24X48 CUFFED NSTRL (MISCELLANEOUS) ×3 IMPLANT
SEAL CERVICAL OMNI LOK (ABLATOR) IMPLANT
SEAL ROD LENS SCOPE MYOSURE (ABLATOR) ×3 IMPLANT
SYR 50ML LL SCALE MARK (SYRINGE) ×3 IMPLANT
SYSTEM TISS REMOVAL MYOSURE XL (MISCELLANEOUS) IMPLANT
TOWEL OR 17X26 10 PK STRL BLUE (TOWEL DISPOSABLE) ×3 IMPLANT
WATER STERILE IRR 500ML POUR (IV SOLUTION) ×3 IMPLANT

## 2021-07-01 NOTE — Anesthesia Preprocedure Evaluation (Addendum)
Anesthesia Evaluation  ?Patient identified by MRN, date of birth, ID band ?Patient awake ? ? ? ?Reviewed: ?Allergy & Precautions, NPO status , Patient's Chart, lab work & pertinent test results ? ?Airway ?Mallampati: II ? ?TM Distance: >3 FB ?Neck ROM: Full ? ? ? Dental ?no notable dental hx. ? ?  ?Pulmonary ?neg pulmonary ROS,  ?  ?Pulmonary exam normal ?breath sounds clear to auscultation ? ? ? ? ? ? Cardiovascular ?hypertension, Pt. on medications ?negative cardio ROS ?Normal cardiovascular exam ?Rhythm:Regular Rate:Normal ? ? ?  ?Neuro/Psych ? Headaches, negative psych ROS  ? GI/Hepatic ?negative GI ROS, Neg liver ROS,   ?Endo/Other  ?negative endocrine ROS ? Renal/GU ?negative Renal ROS  ?negative genitourinary ?  ?Musculoskeletal ?negative musculoskeletal ROS ?(+)  ? Abdominal ?  ?Peds ?negative pediatric ROS ?(+)  Hematology ? ?(+) Blood dyscrasia, anemia ,   ?Anesthesia Other Findings ? ? Reproductive/Obstetrics ?negative OB ROS ? ?  ? ? ? ? ? ? ? ? ? ? ? ? ? ?  ?  ? ? ? ? ? ? ? ? ?Anesthesia Physical ?Anesthesia Plan ? ?ASA: 2 ? ?Anesthesia Plan: General  ? ?Post-op Pain Management:   ? ?Induction: Intravenous ? ?PONV Risk Score and Plan: 3 and Treatment may vary due to age or medical condition, Midazolam, Ondansetron and Dexamethasone ? ?Airway Management Planned: LMA ? ?Additional Equipment: None ? ?Intra-op Plan:  ? ?Post-operative Plan: Extubation in OR ? ?Informed Consent: I have reviewed the patients History and Physical, chart, labs and discussed the procedure including the risks, benefits and alternatives for the proposed anesthesia with the patient or authorized representative who has indicated his/her understanding and acceptance.  ? ? ? ?Dental advisory given ? ?Plan Discussed with: CRNA, Anesthesiologist and Surgeon ? ?Anesthesia Plan Comments:   ? ? ? ? ? ? ?Anesthesia Quick Evaluation ? ?

## 2021-07-01 NOTE — Brief Op Note (Signed)
07/01/2021 ? ?5:44 PM ? ?PATIENT:  [name]  41 y.o. female ? ?PRE-OPERATIVE DIAGNOSIS:  menorrhgia with regular cycle , IDA ?Uterine fibroids ? ?POST-OPERATIVE DIAGNOSIS:  menorrhgia with regular cycle, IDA ?intramural /subserosal/submucosal leiomyoma ? ?PROCEDURE:  Radiofrequency transcervical ablation of uterine fibroids using Sonata, diagnostic hysteroscopy, hysteroscopic resection of submucosal fibroids, dilation and curettage ? ?SURGEON:  Surgeon(s) and Role: ?   Servando Salina, MD - Primary ? ?PHYSICIAN ASSISTANT:  ? ?ASSISTANTS: none  ? ?ANESTHESIA:   general ?Findings: multiple uterine fibroids including right lateral LUS SM fibroids, tubal ostia seen ?EBL:  37m  ? ?BLOOD ADMINISTERED:none ? ?DRAINS: none  ? ?LOCAL MEDICATIONS USED:  NONE ? ?SPECIMEN:  Source of Specimen:  emc with fibroid resection ? ?DISPOSITION OF SPECIMEN:  PATHOLOGY ? ?COUNTS:  YES ? ?TOURNIQUET:  * No tourniquets in log * ? ?DICTATION: .Other Dictation: Dictation Number [id number]? ?PLAN OF CARE: Discharge to home after PACU ? ?PATIENT DISPOSITION:  PACU - hemodynamically stable. ?  ?Delay start of Pharmacological VTE agent (>24hrs) due to surgical blood loss or risk of bleeding: no ? ?

## 2021-07-01 NOTE — H&P (Signed)
[name] is an 41 y.o. female.G4P2 BF with symptomatic uterine fibroids presents for surgical mgmt of uterine fibroids. Pt has had menorrhagia with associated severe iron deficiency anemia for which she was given 2 units of PRBC in order to proceed with the surgery. Pt is scheduled for radiofrequency transcervical ablation of uterine fibroids using Sonata, dx hysteroscopy, D&C, resection ? ?Pertinent Gynecological History: ?Menses: flow is excessive with use of 6 pads or tampons on heaviest days ?Bleeding: menorrhagia ?Contraception: none ?DES exposure: denies ?Blood transfusions:  2unit PRBC ?Sexually transmitted diseases: HSV ?Previous GYN Procedures: DNC , myomectomy ?Last mammogram: normal Date: 2022 ?Last pap: normal Date: 2022 ?OB History: G4P2 ? ?Menstrual History: ?Menarche age: n/a ?Patient's last menstrual period was 06/15/2021. ?  ? ?Past Medical History:  ?Diagnosis Date  ? Abnormal Pap smear 04/24/2002  ? colposcopy  ? Anemia   ? Herpes genitalia 04/24/2006  ? Last outbreak Nov 2012  ? History of blood transfusion 06/23/2021  ? 2 units blood given without problems  ? Hypertension   ? 2009  ? IBS (irritable bowel syndrome)   ? Menorrhagia   ? Pre-eclampsia   ? 2011  ? Wears glasses or glasses   ? ? ?Past Surgical History:  ?Procedure Laterality Date  ? CESAREAN SECTION  2011  ? CESAREAN SECTION  10/16/2011  ? Procedure: CESAREAN SECTION;  Surgeon: Mora Bellman, MD;  Location: [facility];  Service: Gynecology;  Laterality: N/A;  ? COLPOSCOPY  2004  ? DILATION AND CURETTAGE OF UTERUS    ? x2  ? DILATION AND CURETTAGE OF UTERUS    ? Miscarriages x 2  ? HYSTEROSCOPY  2021  ? LT breast biopsy    ? yrs ago benign  ? MYOMECTOMY ABDOMINAL APPROACH    ? yrs ago  ? ? ?Family History  ?Problem Relation Age of Onset  ? Alcohol abuse Father   ? Hypertension Mother   ? Breast cancer Maternal Aunt   ?     2 maternal aunt  ? Breast cancer Paternal Aunt   ? Hypertension Maternal Aunt   ? Heart attack Paternal Uncle    ?     2 uncles  ? Stroke Maternal Grandmother   ?     TIA's  ? Colon cancer Paternal Grandmother   ? Stomach cancer Paternal Grandmother   ? ? ?Social History:  reports that she has never smoked. She has never used smokeless tobacco. She reports that she does not currently use alcohol. She reports that she does not use drugs. ? ?Allergies: No Known Allergies ? ?Medications Prior to Admission  ?Medication Sig Dispense Refill Last Dose  ? amLODipine (NORVASC) 5 MG tablet Take 1 tablet (5 mg total) by mouth daily. NEED OFFICE VISIT FOR FUTURE REFILLS 30 tablet 0 07/01/2021 at 0730  ? cetirizine (ZYRTEC) 10 MG tablet Take 10 mg by mouth at bedtime as needed for allergies.   Past Week  ? FIBER PO Take by mouth daily.   06/30/2021  ? fluticasone (FLONASE) 50 MCG/ACT nasal spray Place 2 sprays into both nostrils daily. (Patient taking differently: Place 2 sprays into both nostrils as needed.) 16 g 6 Past Month  ? hydrochlorothiazide (MICROZIDE) 12.5 MG capsule Take 1 capsule (12.5 mg total) by mouth daily. NEED OFFICE VISIT FOR FUTURE REFILLS 30 capsule 0 07/01/2021 at 0730  ? ibuprofen (ADVIL,MOTRIN) 800 MG tablet Take 1 tablet (800 mg total) by mouth every 8 (eight) hours as needed. 90 tablet 2 Past Month  ?  Iron-FA-B Cmp-C-Biot-Probiotic (FUSION PLUS) CAPS Take 1 capsule by mouth once daily 30 capsule 11 07/01/2021 at 0730  ? MAGNESIUM PO Take by mouth daily.   06/30/2021  ? misoprostol (CYTOTEC) 200 MCG tablet Place 200 mcg vaginally once.   07/01/2021 at [place]  ? OVER THE COUNTER MEDICATION Peppermint caps prn   Past Month  ? valACYclovir (VALTREX) 500 MG tablet Take 1 tablet by mouth once daily 30 tablet 0 07/01/2021 at 0730  ? VITAMIN D PO Take by mouth daily.   06/30/2021  ? ? ?Review of Systems  ?All other systems reviewed and are negative. ? ?Blood pressure (!) 137/94, pulse 93, temperature 98.1 ?F (36.7 ?C), temperature source Oral, resp. rate 16, height '5\' 8"'$  (1.727 m), weight 92.5 kg, last menstrual period 06/15/2021,  SpO2 98 %. ?Physical Exam ?Constitutional:   ?   Appearance: Normal appearance.  ?HENT:  ?   Left Ear: Tympanic membrane normal.  ?   Mouth/Throat:  ?   Mouth: Mucous membranes are moist.  ?Cardiovascular:  ?   Rate and Rhythm: Regular rhythm.  ?Pulmonary:  ?   Breath sounds: Normal breath sounds.  ?Abdominal:  ?   Palpations: Abdomen is soft.  ?Genitourinary: ?   General: Normal vulva.  ?   Comments: Vagina no lesion or discharge ?Cervix parous ? Uterus nodular irreg 12 wk ?Adnexa no mass ?Musculoskeletal:     ?   General: Normal range of motion.  ?   Cervical back: Neck supple.  ?Skin: ?   General: Skin is warm and dry.  ?Neurological:  ?   General: No focal deficit present.  ?   Mental Status: She is alert and oriented to person, place, and time.  ?Psychiatric:     ?   Mood and Affect: Mood normal.     ?   Behavior: Behavior normal.  ? ? ?Results for orders placed or performed during the hospital encounter of 07/01/21 (from the past 24 hour(s))  ?Pregnancy, urine POC     Status: None  ? Collection Time: 07/01/21  9:00 AM  ?Result Value Ref Range  ? Preg Test, Ur NEGATIVE NEGATIVE  ? ? ?No results found. ? ?Assessment/Plan: ?Symptomatic uterine fibroids ?IDA  ?Menorrhagia ?P) radiofrequency transcervical ablation of uterine fibroids using sonata, dx hysteroscopy, D&C, resection. Procedures explained. Risk of surgery reviewed including infection, bleeding, injury to surrounding organ structures, thermal injury, fluid overload and its mgmt,, uterine perforation( 04/998) and its risk. All ? answered ? ?Baljit Liebert A Jamario Colina ?07/01/2021, 11:18 AM ? ?

## 2021-07-01 NOTE — Discharge Instructions (Addendum)
CALL  IF TEMP>100.4, NOTHING PER VAGINA X 2 WK, CALL IF SOAKING A MAXI  PAD EVERY HOUR OR MORE FREQUENTLY  ? ?NO TYLENOL PRODUCTS UNTIL AFTER 3:30 PM TODAY. ? ? ?Post Anesthesia Home Care Instructions ? ?Activity: ?Get plenty of rest for the remainder of the day. A responsible individual must stay with you for 24 hours following the procedure.  ?For the next 24 hours, DO NOT: ?-Drive a car ?-Paediatric nurse ?-Drink alcoholic beverages ?-Take any medication unless instructed by your physician ?-Make any legal decisions or sign important papers. ? ?Meals: ?Start with liquid foods such as gelatin or soup. Progress to regular foods as tolerated. Avoid greasy, spicy, heavy foods. If nausea and/or vomiting occur, drink only clear liquids until the nausea and/or vomiting subsides. Call your physician if vomiting continues. ? ?Special Instructions/Symptoms: ?Your throat may feel dry or sore from the anesthesia or the breathing tube placed in your throat during surgery. If this causes discomfort, gargle with warm salt water. The discomfort should disappear within 24 hours. ? ?If you had a scopolamine patch placed behind your ear for the management of post- operative nausea and/or vomiting: ? ?1. The medication in the patch is effective for 72 hours, after which it should be removed.  Wrap patch in a tissue and discard in the trash. Wash hands thoroughly with soap and water. ?2. You may remove the patch earlier than 72 hours if you experience unpleasant side effects which may include dry mouth, dizziness or visual disturbances. ?3. Avoid touching the patch. Wash your hands with soap and water after contact with the patch. ?    ? ? ?

## 2021-07-01 NOTE — Anesthesia Procedure Notes (Signed)
Procedure Name: LMA Insertion ?Date/Time: 07/01/2021 11:42 AM ?Performed by: Georgeanne Nim, CRNA ?Pre-anesthesia Checklist: Patient identified, Emergency Drugs available, Suction available, Patient being monitored and Timeout performed ?Patient Re-evaluated:Patient Re-evaluated prior to induction ?Oxygen Delivery Method: Circle system utilized ?Preoxygenation: Pre-oxygenation with 100% oxygen ?Induction Type: IV induction ?Ventilation: Mask ventilation without difficulty ?LMA: LMA inserted ?LMA Size: 4.0 ?Number of attempts: 1 ?Placement Confirmation: breath sounds checked- equal and bilateral, CO2 detector and positive ETCO2 ?Tube secured with: Tape ?Dental Injury: Teeth and Oropharynx as per pre-operative assessment  ? ? ? ? ?

## 2021-07-01 NOTE — Transfer of Care (Signed)
Immediate Anesthesia Transfer of Care Note ? ?Patient: [name] ? ?Procedure(s) Performed: Radio Frequency Ablation with Sonata (Uterus) ?DILATATION & CURETTAGE/HYSTEROSCOPY WITH MYOSURE (Uterus) ? ?Patient Location: PACU ? ?Anesthesia Type:General ? ?Level of Consciousness: awake, alert , oriented and patient cooperative ? ?Airway & Oxygen Therapy: Patient Spontanous Breathing and Patient connected to nasal cannula oxygen ? ?Post-op Assessment: Report given to RN and Post -op Vital signs reviewed and stable ? ?Post vital signs: Reviewed and stable ? ?Last Vitals:  ?Vitals Value Taken Time  ?BP 123/79 07/01/21 1352  ?Temp    ?Pulse 81 07/01/21 1356  ?Resp 15 07/01/21 1356  ?SpO2 100 % 07/01/21 1356  ?Vitals shown include unvalidated device data. ? ?Last Pain:  ?Vitals:  ? 07/01/21 0923  ?TempSrc: Oral  ?PainSc: 0-No pain  ?   ? ?Patients Stated Pain Goal: 6 (07/01/21 4196) ? ?Complications: No notable events documented. ?

## 2021-07-02 NOTE — Op Note (Unsigned)
[name], [name] MEDICAL RECORD NO: [id number] ACCOUNT NO: [id number] DATE OF BIRTH: 27-May-1980 FACILITY: [facility] LOCATION: [facility] PHYSICIAN: Tamel Abel A. Garwin Brothers, MD  Operative Report   DATE OF PROCEDURE: 07/01/2021  PREOPERATIVE DIAGNOSES:  Menorrhagia with regular cycles, iron deficiency anemia, uterine fibroids.  PROCEDURE:  Radiofrequency transcervical ablation of uterine fibroids using Sonata, diagnostic hysteroscopy with hysteroscopic resection of submucosal fibroid, dilation and curettage.  POSTOPERATIVE DIAGNOSES:  Intramural submucosal and subserosal fibroids, menorrhagia with regular cycles, iron deficiency anemia.  ANESTHESIA:  General.  SURGEON:  Leylanie Woodmansee A. Garwin Brothers, MD  ASSISTANT:  None.  DESCRIPTION OF PROCEDURE:  Under adequate general anesthesia, the patient was placed in the dorsal lithotomy position.  She was sterilely prepped and draped in the usual fashion.  The patient had voided prior to entering the room.  Bivalve speculum was  placed in the vagina.  A single-tooth tenaculum was placed on the anterior lip of the cervix.  The cervix was serially dilated up to #19 [facility] dilator.  Diagnostic hysteroscope was introduced into the uterine cavity.  A lower uterine segment right  submucosal fibroid was noted on the right lateral wall.  Going further in, both tubal ostia could be seen.  Cavity was somewhat distorted with no other defined fibroids seen in the submucosal area.  The hysteroscope was removed.  The cervix was further  dilated up to #27 [place] dilator.  The Sonata treatment device with ultrasound imaging with radiofrequency energy delivery was inserted transcervically into the uterine cavity.  The fibroids are identified with the ultrasound probe as panoramic view of  the uterus with its fibroids and mapping out of the way to start with the procedure was done.  Five fibroids were ablated with different ablative size based on the overall number of  fibroids using the same area.  The following fibroid locations were  done.  Fibroid #1 type 2-5, measuring 3 cm at the 2 o'clock position, anterior mid fundal at an ablative size of 2.8 x 2.0 cm with a time of 2 minutes and 36 seconds.  Fibroid #2 at the 12 o'clock position with also a type 2-5 measuring 4 cm, had 2  ablations performed, one size was 3.1 x 2.5 at the time of 3 minutes and 12 seconds, a second one at 3.5 x 2.6 cm ablative size for 4 minutes.  Fibroid #3 is 4 cm at the 6 o'clock position, type 2-5, fundal posterior mid, had two ablations sizes done,  one at 3.2 x 2.3 cm at 3 minutes and 18 seconds, the second one is 3.4 x 2.5 cm with ablation time of 3 minutes and 42 seconds.  Fibroid #4 mid posterior lower uterine segment at the 6 o'clock position, type 2-5, 4 cm in size, had 2 ablations done with  ablative size of 3.3 x 2.4 at 3 minutes and 36 seconds ablative. The second one was 3.7 x 2.8 cm, ablative time of 4 minutes and 24 seconds and the last fibroid, who was located at the 3 o'clock position, measured 4 cm and a type 2-5, ablative size was  3.2 x 2.3 cm and at time of 3 minutes and 18 seconds.  Once the spot size and location of fibroids were all set, the trocar tip introducer was advanced into the fibroid after ensuring the guide is within the serosa boundaries, the needle electrodes were  deployed.  A second visual safety check was completed. The delivery of the radiofrequency energy was initiated using the ____ control.  The time  of energy delivered was determined based on the size of the desired ablation.  The targeting and treatment  steps were repeated as required to treat additional fibroids as noted above.  Once the treatment was completed, the needle electrodes and introducers were retracted and the treatment device was then removed.  I then reinserted the diagnostic hysteroscope  and confirmed evidence that the endometrial cavity was without any ablation.  The fibroid on the  right lateral wall was still present.  Using the Reach resectoscope, the lateral fibroid was resected.  The rest of the endometrial cavity was also  resected.  When the procedure was felt to be adequate, all instruments were then removed from the vagina.  Specimen labeled endometrial curettings with fibroid resection was sent to pathology.  Fluid deficit was 300 mL.  Intraoperative fluid was 900 mL.  Complication was none.  The patient tolerated the procedure well and was transferred to recovery in stable condition.   NIK D: 07/02/2021 5:19:49 am T: 07/02/2021 10:50:00 am  JOB: [id number]/ [id number]

## 2021-07-04 ENCOUNTER — Encounter (HOSPITAL_BASED_OUTPATIENT_CLINIC_OR_DEPARTMENT_OTHER): Payer: Self-pay | Admitting: Obstetrics and Gynecology

## 2021-07-04 LAB — SURGICAL PATHOLOGY

## 2021-07-04 NOTE — Anesthesia Postprocedure Evaluation (Signed)
Anesthesia Post Note ? ?Patient: Devone Bonilla ? ?Procedure(s) Performed: Radio Frequency Ablation with Sonata (Uterus) ?DILATATION & CURETTAGE/HYSTEROSCOPY WITH MYOSURE (Uterus) ? ?  ? ?Patient location during evaluation: PACU ?Anesthesia Type: General ?Level of consciousness: awake ?Pain management: pain level controlled ?Vital Signs Assessment: post-procedure vital signs reviewed and stable ?Respiratory status: spontaneous breathing and respiratory function stable ?Cardiovascular status: stable ?Postop Assessment: no apparent nausea or vomiting ?Anesthetic complications: no ? ? ?No notable events documented. ? ?Last Vitals:  ?Vitals:  ? 07/01/21 1430 07/01/21 1520  ?BP: (!) 145/91 (!) 136/91  ?Pulse: 75 85  ?Resp: 11 16  ?Temp:  36.5 ?C  ?SpO2: 100% 100%  ?  ?Last Pain:  ?Vitals:  ? 07/01/21 1520  ?TempSrc: Oral  ?PainSc: 3   ? ? ?  ?  ?  ?  ?  ?  ? ?Merlinda Frederick ? ? ? ? ?

## 2021-07-12 ENCOUNTER — Other Ambulatory Visit: Payer: Self-pay | Admitting: Family Medicine

## 2021-07-12 DIAGNOSIS — I10 Essential (primary) hypertension: Secondary | ICD-10-CM

## 2021-07-12 NOTE — Telephone Encounter (Signed)
Spoke with patient about needing an appointment with labs  & patient stated that she has labs done recently because she just had surgery & she wanted to know if it's possible to see those current labs of hers & if she still needed an appointment? ?

## 2021-07-12 NOTE — Telephone Encounter (Signed)
Call pt she must schedule an appt with labs for medication refills. thanks ?

## 2021-07-25 ENCOUNTER — Other Ambulatory Visit: Payer: Self-pay | Admitting: Family Medicine

## 2021-07-25 DIAGNOSIS — I10 Essential (primary) hypertension: Secondary | ICD-10-CM

## 2021-07-27 NOTE — Progress Notes (Signed)
?[facility] ? ?New Evaluation ? ?Date:  07/29/2021  ? ?ID:  Amandeep Hogston, DOB 1981/03/29, MRN [id number] ? ?PCP:  Hali Marry, MD ?  ?[facility] Providers ?Cardiologist:  None  ?Electrophysiologist:  None      ? ?Referring MD: Hali Marry, *  ? ?Chief Complaint: Hypertension ? ?History of Present Illness:   ? ?[name] is a 41 y.o. female [[id number]] who is being seen today for the evaluation of hypertension and pre-conception CV evaluation at the request of Hali Marry, *.  ? ?Today, the patient states that she is planning on becoming pregnant and would like pre-conception CV evaluation. Patient has a history of 2 miscarriages thought to be due to uterine fibroids. She underwent abdominal myomectomy with removal of >20 fibroids prior to her 2 full term pregnancies. With her first pregnancy, she had pre-eclampsia and was delivered at 36w and 1 day by c-section.  Second pregnancy was uncomplicated. She has since tried to get pregnant again but without success. She just underwent radiofrequency ablation with sonata for treatment of recurrent fibroids.  ? ?In terms of her blood pressure, that patient was diagnosed with HTN in 2008. Was taken off her BP meds when she became pregnant and was restarted on her meds back in 2014. Currently her BP runs about 130/80s. She is taking amlodipine '5mg'$  daily and HCTZ 12.'5mg'$  daily. She was told to increase her HCTZ, however, she was urinating so frequently that she had to go back down. ? ?Otherwise, no chest pain, SOB, lightheadedness, dizziness or syncope. She has no known diabetes and no family history of premature CAD. She is active without anginal symptoms. ? ? ?Prior CV Studies Reviewed: ?The following studies were reviewed today: ?No CV studies ? ?Past Medical History:  ?Diagnosis Date  ? Abnormal Pap smear 04/24/2002  ? colposcopy  ? Anemia   ? Herpes genitalia 04/24/2006  ? Last outbreak Nov 2012  ? History of blood  transfusion 06/23/2021  ? 2 units blood given without problems  ? Hypertension   ? 2009  ? IBS (irritable bowel syndrome)   ? Menorrhagia   ? Pre-eclampsia   ? 2011  ? Wears glasses or glasses   ? ? ?Past Surgical History:  ?Procedure Laterality Date  ? CESAREAN SECTION  2011  ? CESAREAN SECTION  10/16/2011  ? Procedure: CESAREAN SECTION;  Surgeon: Mora Bellman, MD;  Location: [facility];  Service: Gynecology;  Laterality: N/A;  ? COLPOSCOPY  2004  ? DILATATION & CURETTAGE/HYSTEROSCOPY WITH MYOSURE N/A 07/01/2021  ? Procedure: [place];  Surgeon: Servando Salina, MD;  Location: [facility];  Service: Gynecology;  Laterality: N/A;  ? DILATION AND CURETTAGE OF UTERUS    ? x2  ? DILATION AND CURETTAGE OF UTERUS    ? Miscarriages x 2  ? HYSTEROSCOPY  2021  ? LT breast biopsy    ? yrs ago benign  ? MYOMECTOMY ABDOMINAL APPROACH    ? yrs ago  ?   ? ?OB History   ? ? Gravida  ?4  ? Para  ?2  ? Term  ?1  ? Preterm  ?1  ? AB  ?2  ? Living  ?2  ?  ? ? SAB  ?2  ? IAB  ?   ? Ectopic  ?   ? Multiple  ?   ? Live Births  ?2  ?   ?  ?  ?    ? ? ?  Current Medications: ?Current Meds  ?Medication Sig  ? amLODipine (NORVASC) 10 MG tablet Take 1 tablet (10 mg total) by mouth daily.  ? cetirizine (ZYRTEC) 10 MG tablet Take 10 mg by mouth at bedtime as needed for allergies.  ? FIBER PO Take by mouth daily.  ? fluticasone (FLONASE) 50 MCG/ACT nasal spray Place 2 sprays into both nostrils daily.  ? ibuprofen (ADVIL,MOTRIN) 800 MG tablet Take 1 tablet (800 mg total) by mouth every 8 (eight) hours as needed.  ? Iron-FA-B Cmp-C-Biot-Probiotic (FUSION PLUS) CAPS Take 1 capsule by mouth once daily  ? MAGNESIUM PO Take by mouth daily.  ? misoprostol (CYTOTEC) 200 MCG tablet Place 200 mcg vaginally once.  ? OVER THE COUNTER MEDICATION Peppermint caps prn  ? valACYclovir (VALTREX) 500 MG tablet Take 1 tablet by mouth once daily  ? VITAMIN D PO Take by mouth daily.  ? [DISCONTINUED] amLODipine  (NORVASC) 5 MG tablet TAKE 1 TABLET BY MOUTH ONCE DAILY . APPOINTMENT REQUIRED FOR FUTURE REFILLS  ? [DISCONTINUED] hydrochlorothiazide (MICROZIDE) 12.5 MG capsule TAKE 1 CAPSULE BY MOUTH ONCE DAILY . APPOINTMENT REQUIRED FOR FUTURE REFILLS  ?  ? ?Allergies:   Patient has no known allergies.  ? ?Social History  ? ?Socioeconomic History  ? Marital status: Single  ?  Spouse name: Not on file  ? Number of children: Not on file  ? Years of education: Not on file  ? Highest education level: Not on file  ?Occupational History  ? Occupation: speech therapist  ?Tobacco Use  ? Smoking status: Never  ? Smokeless tobacco: Never  ?Vaping Use  ? Vaping Use: Never used  ?Substance and Sexual Activity  ? Alcohol use: Not Currently  ? Drug use: No  ? Sexual activity: Not on file  ?Other Topics Concern  ? Not on file  ?Social History Narrative  ? Not on file  ? ?Social Determinants of Health  ? ?Financial Resource Strain: Not on file  ?Food Insecurity: Not on file  ?Transportation Needs: Not on file  ?Physical Activity: Not on file  ?Stress: Not on file  ?Social Connections: Not on file  ?  ? ? ?Family History  ?Problem Relation Age of Onset  ? Alcohol abuse Father   ? Hypertension Mother   ? Breast cancer Maternal Aunt   ?     2 maternal aunt  ? Breast cancer Paternal Aunt   ? Hypertension Maternal Aunt   ? Heart attack Paternal Uncle   ?     2 uncles  ? Stroke Maternal Grandmother   ?     TIA's  ? Colon cancer Paternal Grandmother   ? Stomach cancer Paternal Grandmother   ?   ? ?ROS:   ?Please see the history of present illness.    ?Review of Systems  ?Respiratory:  Negative for shortness of breath.   ?Cardiovascular:  Negative for chest pain, palpitations, orthopnea, claudication, leg swelling and PND.  ?Neurological:  Negative for dizziness and loss of consciousness.   ? ? ?Labs/EKG Reviewed:   ? ?EKG:   ?EKG 06/21/21: NSR with no acute ischemic changes ? ?Recent Labs: ?06/21/2021: BUN 15; Creatinine, Ser 0.65; Potassium 3.6;  Sodium 132 ?06/30/2021: Hemoglobin 9.7; Platelets 370  ? ?Recent Lipid Panel ?Lab Results  ?Component Value Date/Time  ? CHOL 146 06/06/2019 08:12 AM  ? TRIG 43 06/06/2019 08:12 AM  ? HDL 60 06/06/2019 08:12 AM  ? CHOLHDL 2.4 06/06/2019 08:12 AM  ? LDLCALC 74 06/06/2019 08:12 AM  ? ? ?  Physical Exam:   ? ?VS:  BP (!) 132/92   Pulse 93   Ht '5\' 8"'$  (1.727 m)   Wt 205 lb (93 kg)   SpO2 96%   BMI 31.17 kg/m?    ? ?Wt Readings from Last 3 Encounters:  ?07/29/21 205 lb (93 kg)  ?07/01/21 204 lb (92.5 kg)  ?06/23/21 199 lb (90.3 kg)  ?  ? ?GEN:  Well nourished, well developed in no acute distress ?HEENT: Normal ?NECK: No JVD; No carotid bruits ?CARDIAC: RRR, no murmurs, rubs, gallops ?RESPIRATORY:  Clear to auscultation without rales, wheezing or rhonchi  ?ABDOMEN: Soft, non-tender, non-distended ?MUSCULOSKELETAL:  No edema; No deformity  ?SKIN: Warm and dry ?NEUROLOGIC:  Alert and oriented x 3 ?PSYCHIATRIC:  Normal affect  ? ? ? ? ?ASSESSMENT & PLAN:   ? ?#HTN: ?Patient with longstanding hypertension that was initially diagnosed in 2008. Currently on amlodipine and HCTZ with blood pressure mainly running in 130s. She is hoping to become pregnant. We discussed safe antihypertensive options and the fact that she may require adjustments during the pregnancy period. Notably, she has a history of pre-eclampsia during her first pregnancy and she is at high risk of recurrence. Will plan to transition to amlodipine for now. Can add labetalol as needed as well. ?-Continue amlodipine '10mg'$  daily ?-Stop HCTZ ?-Will follow-up in clinic once she is pregnant ? ?#History of Pre-eclampsia: ?Occurred during first pregnancy. High risk of recurrence. Will need ASA if she is to become pregnant. She is also higher risk for CV disease and will need continued monitoring/prevention going forward.  ? ?#Pre-pregnancy counseling: ?High risk due to age, prior history of pre-eclampsia and HTN. Will need close monitoring if she is to become  pregnant. ? ?Patient Instructions  ?Medication Instructions:  ? ?STOP TAKING HYDROCHLOROTHIAZIDE NOW ? ?INCREASE YOUR AMLODIPINE TO 10 MG BY MOUTH DAILY ? ?*If you need a refill on your cardiac medications before your next

## 2021-07-29 ENCOUNTER — Encounter: Payer: Self-pay | Admitting: Cardiology

## 2021-07-29 ENCOUNTER — Encounter: Payer: Self-pay | Admitting: Hematology

## 2021-07-29 ENCOUNTER — Ambulatory Visit (INDEPENDENT_AMBULATORY_CARE_PROVIDER_SITE_OTHER): Payer: BC Managed Care – PPO | Admitting: Cardiology

## 2021-07-29 VITALS — BP 132/92 | HR 93 | Ht 68.0 in | Wt 205.0 lb

## 2021-07-29 DIAGNOSIS — Z8759 Personal history of other complications of pregnancy, childbirth and the puerperium: Secondary | ICD-10-CM

## 2021-07-29 DIAGNOSIS — I10 Essential (primary) hypertension: Secondary | ICD-10-CM | POA: Diagnosis not present

## 2021-07-29 MED ORDER — AMLODIPINE BESYLATE 10 MG PO TABS: 10.0000 mg | ORAL_TABLET | Freq: Every day | ORAL | 3 refills | Status: DC

## 2021-07-29 NOTE — Patient Instructions (Signed)
Medication Instructions:  ? ?STOP TAKING HYDROCHLOROTHIAZIDE NOW ? ?INCREASE YOUR AMLODIPINE TO 10 MG BY MOUTH DAILY ? ?*If you need a refill on your cardiac medications before your next appointment, please call your pharmacy* ? ? ? ?Follow-Up: ? ?PLEASE CALL us WHEN YOU GET PREGNANT TO SCHEDULE AN APPOINTMENT HERE AT [place] DR. Johney Frame OR [place] [facility] ? ? ? ?

## 2021-08-05 ENCOUNTER — Other Ambulatory Visit: Payer: Self-pay | Admitting: Family

## 2021-08-05 DIAGNOSIS — D649 Anemia, unspecified: Secondary | ICD-10-CM

## 2021-08-08 ENCOUNTER — Inpatient Hospital Stay: Payer: BC Managed Care – PPO

## 2021-08-08 ENCOUNTER — Inpatient Hospital Stay: Payer: BC Managed Care – PPO | Admitting: Family

## 2021-08-15 ENCOUNTER — Inpatient Hospital Stay: Payer: BC Managed Care – PPO | Admitting: Family

## 2021-08-15 ENCOUNTER — Inpatient Hospital Stay: Payer: BC Managed Care – PPO

## 2021-08-19 ENCOUNTER — Other Ambulatory Visit: Payer: Self-pay

## 2021-08-19 ENCOUNTER — Inpatient Hospital Stay: Payer: BC Managed Care – PPO | Attending: Family

## 2021-08-19 ENCOUNTER — Inpatient Hospital Stay: Payer: BC Managed Care – PPO | Admitting: Family

## 2021-08-19 ENCOUNTER — Encounter: Payer: Self-pay | Admitting: Family

## 2021-08-19 VITALS — BP 146/91 | HR 98 | Temp 98.1°F | Resp 18 | Ht 68.0 in | Wt 207.1 lb

## 2021-08-19 DIAGNOSIS — D649 Anemia, unspecified: Secondary | ICD-10-CM

## 2021-08-19 DIAGNOSIS — K589 Irritable bowel syndrome without diarrhea: Secondary | ICD-10-CM | POA: Insufficient documentation

## 2021-08-19 DIAGNOSIS — R5383 Other fatigue: Secondary | ICD-10-CM | POA: Diagnosis not present

## 2021-08-19 DIAGNOSIS — D500 Iron deficiency anemia secondary to blood loss (chronic): Secondary | ICD-10-CM | POA: Diagnosis not present

## 2021-08-19 DIAGNOSIS — N920 Excessive and frequent menstruation with regular cycle: Secondary | ICD-10-CM | POA: Diagnosis not present

## 2021-08-19 DIAGNOSIS — D259 Leiomyoma of uterus, unspecified: Secondary | ICD-10-CM | POA: Insufficient documentation

## 2021-08-19 DIAGNOSIS — Z79899 Other long term (current) drug therapy: Secondary | ICD-10-CM | POA: Diagnosis not present

## 2021-08-19 LAB — CMP (CANCER CENTER ONLY)
ALT: 11 U/L (ref 0–44)
AST: 13 U/L — ABNORMAL LOW (ref 15–41)
Albumin: 4 g/dL (ref 3.5–5.0)
Alkaline Phosphatase: 87 U/L (ref 38–126)
Anion gap: 5 (ref 5–15)
BUN: 9 mg/dL (ref 6–20)
CO2: 27 mmol/L (ref 22–32)
Calcium: 9.5 mg/dL (ref 8.9–10.3)
Chloride: 102 mmol/L (ref 98–111)
Creatinine: 0.75 mg/dL (ref 0.44–1.00)
GFR, Estimated: >60 mL/min (ref >60)
Glucose, Bld: 99 mg/dL (ref 70–99)
Potassium: 3.8 mmol/L (ref 3.5–5.1)
Sodium: 134 mmol/L — ABNORMAL LOW (ref 135–145)
Total Bilirubin: 0.3 mg/dL (ref 0.3–1.2)
Total Protein: 7.9 g/dL (ref 6.5–8.1)

## 2021-08-19 LAB — CBC WITH DIFFERENTIAL (CANCER CENTER ONLY)
Abs Immature Granulocytes: 0.01 10*3/uL (ref 0.00–0.07)
Basophils Absolute: 0 10*3/uL (ref 0.0–0.1)
Basophils Relative: 1 %
Eosinophils Absolute: 0.2 10*3/uL (ref 0.0–0.5)
Eosinophils Relative: 5 %
HCT: 34.1 % — ABNORMAL LOW (ref 36.0–46.0)
Hemoglobin: 10.6 g/dL — ABNORMAL LOW (ref 12.0–15.0)
Immature Granulocytes: 0 %
Lymphocytes Relative: 29 %
Lymphs Abs: 1.3 10*3/uL (ref 0.7–4.0)
MCH: 25.8 pg — ABNORMAL LOW (ref 26.0–34.0)
MCHC: 31.1 g/dL (ref 30.0–36.0)
MCV: 83 fL (ref 80.0–100.0)
Monocytes Absolute: 0.5 10*3/uL (ref 0.1–1.0)
Monocytes Relative: 11 %
Neutro Abs: 2.4 10*3/uL (ref 1.7–7.7)
Neutrophils Relative %: 54 %
Platelet Count: 304 10*3/uL (ref 150–400)
RBC: 4.11 MIL/uL (ref 3.87–5.11)
RDW: 23.4 % — ABNORMAL HIGH (ref 11.5–15.5)
Smear Review: NORMAL
WBC Count: 4.3 10*3/uL (ref 4.0–10.5)
nRBC: 0 % (ref 0.0–0.2)

## 2021-08-19 LAB — FERRITIN: Ferritin: 8 ng/mL — ABNORMAL LOW (ref 11–307)

## 2021-08-19 LAB — RETICULOCYTES
Immature Retic Fract: 16 % — ABNORMAL HIGH (ref 2.3–15.9)
RBC.: 4.12 MIL/uL (ref 3.87–5.11)
Retic Count, Absolute: 57.3 10*3/uL (ref 19.0–186.0)
Retic Ct Pct: 1.4 % (ref 0.4–3.1)

## 2021-08-19 LAB — SAVE SMEAR(SSMR), FOR PROVIDER SLIDE REVIEW

## 2021-08-19 LAB — LACTATE DEHYDROGENASE: LDH: 155 U/L (ref 98–192)

## 2021-08-19 MED ORDER — FUSION PLUS PO CAPS: 1.0000 | ORAL_CAPSULE | Freq: Every day | ORAL | 11 refills | Status: DC

## 2021-08-19 NOTE — Progress Notes (Signed)
?Hematology and Oncology Follow Up Visit ? ?[name] ?[id number] ?10-Jul-1980 41 y.o. ?08/19/2021 ? ? ?Principle Diagnosis:  ?Iron deficiency anemia secondary to heavy cycles, uterine fibroids ? ?Current Therapy:   ?IV iron as indicated  ?  ?Interim History:  [name] is here today to re-establish care with our office. She was previously seen by Dr. Maylon Peppers in 2021 for this same issue and received IV iron.  ?She is symptomatic with fatigue.  ?She would like to restart Fusion plus. She recently ran out.  ?She received blood prior to her uterine ablation in March. Since then she states that she is feeling a little better.  ?Her cycle has not seemed quite as heavy recently. She is still hoping to try to get pregnant over the summer.  ?No other blood loss noted. No bruising or petechiae.  ?She states that her mother also has history of anemia secondary to heavy cycles with uterine fibroids.   ?No fever, chills, n/v, cough, rash, dizziness, SOB, chest pain, palpitations, abdominal pain or changes bladder habits.  ?She has IBS which she notes is exacerbated when she eats red meat or fried foods.  ?No tenderness, numbness or tingling in her extremities at this time.  ?She has minimal puffiness in her ankles that comes and goes.  ?No falls or syncope.  ?She has maintained a good appetite and is staying well hydrated. Her weight is stable at 207 lbs.  ? ?ECOG Performance Status: 1 - Symptomatic but completely ambulatory ? ?Medications:  ?Allergies as of 08/19/2021   ?No Known Allergies ?  ? ?  ?Medication List  ?  ? ?  ? Accurate as of August 19, 2021  3:18 PM. If you have any questions, ask your nurse or doctor.  ?  ?  ? ?  ? ?amLODipine 10 MG tablet ?Commonly known as: NORVASC ?Take 1 tablet (10 mg total) by mouth daily. ?  ?cetirizine 10 MG tablet ?Commonly known as: ZYRTEC ?Take 10 mg by mouth at bedtime as needed for allergies. ?  ?FIBER PO ?Take by mouth daily. ?  ?fluticasone 50 MCG/ACT nasal spray ?Commonly known  as: FLONASE ?Place 2 sprays into both nostrils daily. ?  ?Fusion Plus Caps ?Take 1 capsule by mouth once daily ?  ?ibuprofen 800 MG tablet ?Commonly known as: ADVIL ?Take 1 tablet (800 mg total) by mouth every 8 (eight) hours as needed. ?  ?MAGNESIUM PO ?Take by mouth daily. ?  ?misoprostol 200 MCG tablet ?Commonly known as: CYTOTEC ?Place 200 mcg vaginally once. ?  ?OVER THE COUNTER MEDICATION ?Peppermint caps prn ?  ?valACYclovir 500 MG tablet ?Commonly known as: VALTREX ?Take 1 tablet by mouth once daily ?  ?VITAMIN D PO ?Take by mouth daily. ?  ? ?  ? ? ?Allergies: No Known Allergies ? ?Past Medical History, Surgical history, Social history, and Family History were reviewed and updated. ? ?Review of Systems: ?All other 10 point review of systems is negative.  ? ?Physical Exam: ? vitals were not taken for this visit.  ? ?Wt Readings from Last 3 Encounters:  ?07/29/21 205 lb (93 kg)  ?07/01/21 204 lb (92.5 kg)  ?06/23/21 199 lb (90.3 kg)  ? ? ?Ocular: Sclerae unicteric, pupils equal, round and reactive to light ?Ear-nose-throat: Oropharynx clear, dentition fair ?Lymphatic: No cervical or supraclavicular adenopathy ?Lungs no rales or rhonchi, good excursion bilaterally ?Heart regular rate and rhythm, no murmur appreciated ?Abd soft, nontender, positive bowel sounds ?MSK no focal spinal tenderness, no joint edema ?  Neuro: non-focal, well-oriented, appropriate affect ?Breasts: Deferred ? ?Lab Results  ?Component Value Date  ? WBC 4.7 06/30/2021  ? HGB 9.7 (L) 06/30/2021  ? HCT 33.2 (L) 06/30/2021  ? MCV 77.9 (L) 06/30/2021  ? PLT 370 06/30/2021  ? ?Lab Results  ?Component Value Date  ? FERRITIN 21 07/15/2019  ? IRON 421 (H) 07/15/2019  ? TIBC 421 07/15/2019  ? UIBC 0 (L) 07/15/2019  ? IRONPCTSAT 100 (H) 07/15/2019  ? ?Lab Results  ?Component Value Date  ? RBC 4.26 06/30/2021  ? ?No results found for: KPAFRELGTCHN, LAMBDASER, KAPLAMBRATIO ?No results found for: IGGSERUM, IGA, IGMSERUM ?No results found for:  TOTALPROTELP, ALBUMINELP, A1GS, A2GS, BETS, BETA2SER, GAMS, MSPIKE, SPEI ?  Chemistry   ?   ?Component Value Date/Time  ? NA 132 (L) 06/21/2021 1514  ? NA 132 (A) 06/21/2021 0000  ? K 3.6 06/21/2021 1514  ? CL 102 06/21/2021 1514  ? CO2 23 06/21/2021 1514  ? BUN 15 06/21/2021 1514  ? BUN 15 06/21/2021 0000  ? CREATININE 0.65 06/21/2021 1514  ? CREATININE 0.82 07/15/2019 1209  ? CREATININE 0.80 06/06/2019 0812  ? GLU 97 06/21/2021 0000  ?    ?Component Value Date/Time  ? CALCIUM 8.8 (L) 06/21/2021 1514  ? ALKPHOS 68 07/15/2019 1209  ? AST 30 07/15/2019 1209  ? ALT 23 07/15/2019 1209  ? BILITOT 0.3 07/15/2019 1209  ?  ? ? ? ?Impression and Plan: [name] is a very pleasant 41 yo female with history of iron deficiency anemia secondary to heavy cycles.  ?She will restart her Fusion plus today. ?We will see what her anemia work up shows. She may also benefit from folic acid daily.  ?IV iron has proven to be quite expensive for her in the past. She plans to contact her insurance company next week to inquire about cost of drug and infusion experience.  ?Follow-up in 8 weeks.  ? ?Lottie Dawson, NP ?4/28/20233:18 PM ? ?

## 2021-08-20 LAB — ERYTHROPOIETIN: Erythropoietin: 31.9 m[IU]/mL — ABNORMAL HIGH (ref 2.6–18.5)

## 2021-08-22 LAB — IRON AND IRON BINDING CAPACITY (CC-WL,HP ONLY)
Iron: 31 ug/dL (ref 28–170)
Saturation Ratios: 6 % — ABNORMAL LOW (ref 10.4–31.8)
TIBC: 508 ug/dL — ABNORMAL HIGH (ref 250–450)
UIBC: 477 ug/dL — ABNORMAL HIGH (ref 148–442)

## 2021-08-23 ENCOUNTER — Telehealth: Payer: Self-pay | Admitting: *Deleted

## 2021-08-23 LAB — HGB FRACTIONATION CASCADE
Hgb A2: 2.4 % (ref 1.8–3.2)
Hgb A: 97.6 % (ref 96.4–98.8)
Hgb F: 0 % (ref 0.0–2.0)
Hgb S: 0 %

## 2021-08-23 NOTE — Telephone Encounter (Signed)
Call received from patient requesting iron results from 08/19/21.  Pt notified of iron results and that Dr. Marin Olp would like for her to receive three doses of Venofer 200 mg IV.  Message sent to scheduling.  Pt appreciative of assistance and has no further questions at this time.  ?

## 2021-08-23 NOTE — Telephone Encounter (Signed)
Per 08/19/21 los - called and lvm of upcoming appointments - requested call back to confirm ?

## 2021-08-25 ENCOUNTER — Telehealth: Payer: Self-pay | Admitting: *Deleted

## 2021-08-30 LAB — ALPHA-THALASSEMIA GENOTYPR

## 2021-09-01 ENCOUNTER — Inpatient Hospital Stay: Payer: BC Managed Care – PPO | Attending: Family

## 2021-09-01 VITALS — BP 129/84 | HR 83 | Temp 98.1°F | Resp 18

## 2021-09-01 DIAGNOSIS — D500 Iron deficiency anemia secondary to blood loss (chronic): Secondary | ICD-10-CM | POA: Diagnosis not present

## 2021-09-01 DIAGNOSIS — D259 Leiomyoma of uterus, unspecified: Secondary | ICD-10-CM | POA: Diagnosis not present

## 2021-09-01 DIAGNOSIS — N920 Excessive and frequent menstruation with regular cycle: Secondary | ICD-10-CM | POA: Insufficient documentation

## 2021-09-01 DIAGNOSIS — D509 Iron deficiency anemia, unspecified: Secondary | ICD-10-CM

## 2021-09-01 MED ORDER — SODIUM CHLORIDE 0.9 % IV SOLN: Freq: Once | INTRAVENOUS | Status: AC

## 2021-09-01 MED ORDER — SODIUM CHLORIDE 0.9 % IV SOLN
200.0000 mg | Freq: Once | INTRAVENOUS | Status: AC
  Administered 2021-09-01: 200 mg via INTRAVENOUS
  Filled 2021-09-01: qty 200

## 2021-09-01 MED ORDER — SODIUM CHLORIDE 0.9% FLUSH: 10.0000 mL | Freq: Once | INTRAVENOUS | Status: DC

## 2021-09-01 NOTE — Patient Instructions (Signed)

## 2021-09-08 ENCOUNTER — Inpatient Hospital Stay: Payer: BC Managed Care – PPO

## 2021-09-08 VITALS — BP 141/86 | HR 93 | Temp 98.0°F | Resp 16

## 2021-09-08 DIAGNOSIS — D509 Iron deficiency anemia, unspecified: Secondary | ICD-10-CM

## 2021-09-08 DIAGNOSIS — N920 Excessive and frequent menstruation with regular cycle: Secondary | ICD-10-CM | POA: Diagnosis not present

## 2021-09-08 DIAGNOSIS — D259 Leiomyoma of uterus, unspecified: Secondary | ICD-10-CM | POA: Diagnosis not present

## 2021-09-08 DIAGNOSIS — D500 Iron deficiency anemia secondary to blood loss (chronic): Secondary | ICD-10-CM | POA: Diagnosis not present

## 2021-09-08 MED ORDER — SODIUM CHLORIDE 0.9 % IV SOLN
200.0000 mg | Freq: Once | INTRAVENOUS | Status: AC
  Administered 2021-09-08: 200 mg via INTRAVENOUS
  Filled 2021-09-08: qty 10

## 2021-09-08 MED ORDER — SODIUM CHLORIDE 0.9 % IV SOLN: Freq: Once | INTRAVENOUS | Status: AC

## 2021-09-08 NOTE — Patient Instructions (Signed)

## 2021-09-10 ENCOUNTER — Other Ambulatory Visit: Payer: Self-pay | Admitting: Family Medicine

## 2021-09-10 DIAGNOSIS — B009 Herpesviral infection, unspecified: Secondary | ICD-10-CM

## 2021-09-15 ENCOUNTER — Inpatient Hospital Stay: Payer: BC Managed Care – PPO

## 2021-09-15 VITALS — BP 139/83 | HR 79 | Temp 98.2°F | Resp 17

## 2021-09-15 DIAGNOSIS — N920 Excessive and frequent menstruation with regular cycle: Secondary | ICD-10-CM | POA: Diagnosis not present

## 2021-09-15 DIAGNOSIS — D509 Iron deficiency anemia, unspecified: Secondary | ICD-10-CM

## 2021-09-15 DIAGNOSIS — D259 Leiomyoma of uterus, unspecified: Secondary | ICD-10-CM | POA: Diagnosis not present

## 2021-09-15 DIAGNOSIS — D500 Iron deficiency anemia secondary to blood loss (chronic): Secondary | ICD-10-CM | POA: Diagnosis not present

## 2021-09-15 MED ORDER — SODIUM CHLORIDE 0.9 % IV SOLN: Freq: Once | INTRAVENOUS | Status: AC

## 2021-09-15 MED ORDER — SODIUM CHLORIDE 0.9 % IV SOLN
200.0000 mg | Freq: Once | INTRAVENOUS | Status: AC
  Administered 2021-09-15: 200 mg via INTRAVENOUS
  Filled 2021-09-15: qty 200

## 2021-09-15 NOTE — Patient Instructions (Signed)

## 2021-09-18 DIAGNOSIS — R7303 Prediabetes: Secondary | ICD-10-CM | POA: Diagnosis not present

## 2021-09-25 DIAGNOSIS — R7303 Prediabetes: Secondary | ICD-10-CM | POA: Diagnosis not present

## 2021-10-09 ENCOUNTER — Other Ambulatory Visit: Payer: Self-pay | Admitting: Family Medicine

## 2021-10-09 DIAGNOSIS — B009 Herpesviral infection, unspecified: Secondary | ICD-10-CM

## 2021-10-19 ENCOUNTER — Inpatient Hospital Stay: Payer: BC Managed Care – PPO

## 2021-10-19 ENCOUNTER — Inpatient Hospital Stay: Payer: BC Managed Care – PPO | Admitting: Family

## 2021-10-26 DIAGNOSIS — R7303 Prediabetes: Secondary | ICD-10-CM | POA: Diagnosis not present

## 2021-12-07 ENCOUNTER — Other Ambulatory Visit: Payer: Self-pay | Admitting: Family Medicine

## 2021-12-07 DIAGNOSIS — B009 Herpesviral infection, unspecified: Secondary | ICD-10-CM

## 2022-01-03 DIAGNOSIS — N920 Excessive and frequent menstruation with regular cycle: Secondary | ICD-10-CM | POA: Diagnosis not present

## 2022-01-03 DIAGNOSIS — D259 Leiomyoma of uterus, unspecified: Secondary | ICD-10-CM | POA: Diagnosis not present

## 2022-01-03 DIAGNOSIS — D649 Anemia, unspecified: Secondary | ICD-10-CM | POA: Diagnosis not present

## 2022-01-03 DIAGNOSIS — Z124 Encounter for screening for malignant neoplasm of cervix: Secondary | ICD-10-CM | POA: Diagnosis not present

## 2022-01-03 LAB — RESULTS CONSOLE HPV: CHL HPV: NEGATIVE

## 2022-01-03 LAB — HM PAP SMEAR: HM Pap smear: NEGATIVE

## 2022-01-13 DIAGNOSIS — N920 Excessive and frequent menstruation with regular cycle: Secondary | ICD-10-CM | POA: Diagnosis not present

## 2022-01-13 DIAGNOSIS — D649 Anemia, unspecified: Secondary | ICD-10-CM | POA: Diagnosis not present

## 2022-01-27 DIAGNOSIS — D259 Leiomyoma of uterus, unspecified: Secondary | ICD-10-CM | POA: Diagnosis not present

## 2022-01-27 DIAGNOSIS — D250 Submucous leiomyoma of uterus: Secondary | ICD-10-CM | POA: Diagnosis not present

## 2022-01-31 ENCOUNTER — Other Ambulatory Visit: Payer: Self-pay | Admitting: Family Medicine

## 2022-01-31 DIAGNOSIS — B009 Herpesviral infection, unspecified: Secondary | ICD-10-CM

## 2022-02-07 ENCOUNTER — Other Ambulatory Visit: Payer: Self-pay | Admitting: Family Medicine

## 2022-02-07 DIAGNOSIS — B009 Herpesviral infection, unspecified: Secondary | ICD-10-CM

## 2022-02-09 ENCOUNTER — Other Ambulatory Visit: Payer: Self-pay | Admitting: Family Medicine

## 2022-02-09 DIAGNOSIS — B009 Herpesviral infection, unspecified: Secondary | ICD-10-CM

## 2022-02-13 ENCOUNTER — Telehealth: Payer: Self-pay | Admitting: Family Medicine

## 2022-02-13 DIAGNOSIS — D259 Leiomyoma of uterus, unspecified: Secondary | ICD-10-CM | POA: Diagnosis not present

## 2022-02-13 DIAGNOSIS — B009 Herpesviral infection, unspecified: Secondary | ICD-10-CM

## 2022-02-13 NOTE — Telephone Encounter (Signed)
Patient has called multiple times and left message and no response. She is needing refill valACYclovir (VALTREX) 500 MG tablet.

## 2022-02-14 MED ORDER — VALACYCLOVIR HCL 500 MG PO TABS: 500.0000 mg | ORAL_TABLET | Freq: Every day | ORAL | 3 refills | Status: DC

## 2022-02-14 NOTE — Telephone Encounter (Signed)
Med sent to [facility]

## 2022-02-16 DIAGNOSIS — D251 Intramural leiomyoma of uterus: Secondary | ICD-10-CM | POA: Diagnosis not present

## 2022-02-16 DIAGNOSIS — D250 Submucous leiomyoma of uterus: Secondary | ICD-10-CM | POA: Diagnosis not present

## 2022-02-16 DIAGNOSIS — D252 Subserosal leiomyoma of uterus: Secondary | ICD-10-CM | POA: Diagnosis not present

## 2022-02-24 ENCOUNTER — Ambulatory Visit (INDEPENDENT_AMBULATORY_CARE_PROVIDER_SITE_OTHER): Payer: BC Managed Care – PPO | Admitting: Family Medicine

## 2022-02-24 DIAGNOSIS — I10 Essential (primary) hypertension: Secondary | ICD-10-CM

## 2022-02-24 DIAGNOSIS — R7301 Impaired fasting glucose: Secondary | ICD-10-CM

## 2022-02-24 NOTE — Progress Notes (Unsigned)
   Established Patient Office Visit  Subjective   Patient ID: [name], female    DOB: [birth year]  Age: 41 y.o. MRN: [id number]  No chief complaint on file.   HPI  Hypertension- Pt denies chest pain, SOB, dizziness, or heart palpitations.  Taking meds as directed w/o problems.  Denies medication side effects.    Impaired fasting glucose-no increased thirst or urination. No symptoms consistent with hypoglycemia.   {History (Optional):23778}  ROS    Objective:     There were no vitals taken for this visit. {Vitals History (Optional):23777}  Physical Exam   No results found for any visits on 02/24/22.  {Labs (Optional):23779}  The 10-year ASCVD risk score (Arnett DK, et al., 2019) is: 1.6%    Assessment & Plan:   Problem List Items Addressed This Visit       Cardiovascular and Mediastinum   HYPERTENSION, BENIGN - Primary     Endocrine   IFG (impaired fasting glucose)    No follow-ups on file.    Beatrice Lecher, MD

## 2022-03-24 ENCOUNTER — Encounter: Payer: Self-pay | Admitting: Hematology & Oncology

## 2022-03-24 ENCOUNTER — Ambulatory Visit
Admission: RE | Admit: 2022-03-24 | Discharge: 2022-03-24 | Disposition: A | Discharge status: Home / Self Care | Payer: BC Managed Care – PPO | Source: Ambulatory Visit

## 2022-03-24 VITALS — BP 140/93 | HR 95 | Temp 99.5°F | Resp 16 | Ht 68.0 in | Wt 208.0 lb

## 2022-03-24 DIAGNOSIS — J029 Acute pharyngitis, unspecified: Secondary | ICD-10-CM

## 2022-03-24 LAB — RESP PANEL BY RT-PCR (FLU A&B, COVID) ARPGX2
Influenza A by PCR: NEGATIVE
Influenza B by PCR: NEGATIVE
SARS Coronavirus 2 by RT PCR: NEGATIVE

## 2022-03-24 MED ORDER — AZITHROMYCIN 250 MG PO TABS: 250.0000 mg | ORAL_TABLET | Freq: Every day | ORAL | 0 refills | Status: DC

## 2022-03-24 NOTE — Discharge Instructions (Addendum)
Advised patient to take medication as directed with food to completion.  Encouraged patient increase daily water intake while taking this medication.  Advised patient we will follow-up with COVID-19/influenza results once received.

## 2022-03-24 NOTE — ED Triage Notes (Signed)
Sore throat, cough & congestion x on waking  yesterday morning  Per pt she normally gets an URI  this time of year  Coricidin Cold & flu  OTC - min relief  Denies fever

## 2022-03-24 NOTE — ED Provider Notes (Signed)
[facility]    CSN: [id number] Arrival date & time: 03/24/22  1540      History   Chief Complaint Chief Complaint  Patient presents with   Sore Throat    HPI [name] is a 41 y.o. female.   HPI 41 year old female presents with sore throat, cough, congestion upon waking yesterday morning.  PMH significant for obesity, IBS, HTN, and anemia.  Past Medical History:  Diagnosis Date   Abnormal Pap smear 04/24/2002   colposcopy   Anemia    Herpes genitalia 04/24/2006   Last outbreak Nov 2012   History of blood transfusion 06/23/2021   2 units blood given without problems   Hypertension    2009   IBS (irritable bowel syndrome)    Menorrhagia    Pre-eclampsia    2011   Wears glasses or glasses     Patient Active Problem List   Diagnosis Date Noted   Infertility, female 06/08/2020   Insomnia 06/08/2020   History of COVID-19 12/22/2019   Iron deficiency anemia 08/17/2017   Acute intractable tension-type headache 02/26/2017   IFG (impaired fasting glucose) 10/21/2015   Family history of breast cancer 05/27/2012   Herpes genitalia 04/10/2011   Uterine fibroids affecting pregnancy 04/10/2011   H/O: C-section 02/25/2010   HYPERTENSION, BENIGN 07/30/2007    Past Surgical History:  Procedure Laterality Date   CESAREAN SECTION  2011   CESAREAN SECTION  10/16/2011   Procedure: CESAREAN SECTION;  Surgeon: Mora Bellman, MD;  Location: [facility];  Service: Gynecology;  Laterality: N/A;   COLPOSCOPY  2004   DILATATION & CURETTAGE/HYSTEROSCOPY WITH MYOSURE N/A 07/01/2021   Procedure: DILATATION & CURETTAGE/HYSTEROSCOPY WITH MYOSURE;  Surgeon: Servando Salina, MD;  Location: [facility];  Service: Gynecology;  Laterality: N/A;   DILATION AND CURETTAGE OF UTERUS     x2   DILATION AND CURETTAGE OF UTERUS     Miscarriages x 2   HYSTEROSCOPY  2021   LT breast biopsy     yrs ago benign   MYOMECTOMY ABDOMINAL APPROACH     yrs ago    OB  History     Gravida  4   Para  2   Term  1   Preterm  1   AB  2   Living  2      SAB  2   IAB      Ectopic      Multiple      Live Births  2            Home Medications    Prior to Admission medications   Medication Sig Start Date End Date Taking? Authorizing Provider  azithromycin (ZITHROMAX) 250 MG tablet Take 1 tablet (250 mg total) by mouth daily. Take first 2 tablets together, then 1 every day until finished. 03/24/22  Yes Eliezer Lofts, FNP  Iron-FA-B Cmp-C-Biot-Probiotic (FUSION PLUS) CAPS Take 1 capsule by mouth daily. 03/10/22  Yes [provider]  amLODipine (NORVASC) 10 MG tablet Take 1 tablet (10 mg total) by mouth daily. 07/29/21   Freada Bergeron, MD  cetirizine (ZYRTEC) 10 MG tablet Take 10 mg by mouth at bedtime as needed for allergies.    [provider]  FIBER PO Take by mouth daily.    [provider]  fluticasone (FLONASE) 50 MCG/ACT nasal spray Place 2 sprays into both nostrils daily. 08/20/18   Hali Marry, MD  ibuprofen (ADVIL,MOTRIN) 800 MG tablet Take 1 tablet (800  mg total) by mouth every 8 (eight) hours as needed. Patient not taking: Reported on 03/24/2022 06/28/17   Guss Bunde, MD  MAGNESIUM PO Take by mouth daily. Patient not taking: Reported on 03/24/2022    [provider]  misoprostol (CYTOTEC) 200 MCG tablet Place 200 mcg vaginally once. Patient not taking: Reported on 03/24/2022    [provider]  MYFEMBREE 40-1-0.5 MG TABS Take 1 tablet by mouth daily.    [provider]  OVER THE COUNTER MEDICATION Peppermint caps prn    [provider]  valACYclovir (VALTREX) 500 MG tablet Take 1 tablet (500 mg total) by mouth daily. 02/14/22   Hali Marry, MD  VITAMIN D PO Take by mouth daily.    [provider]    Family History Family History  Problem Relation Age of Onset   Alcohol abuse Father    Hypertension Mother    Breast cancer  Maternal Aunt        2 maternal aunt   Breast cancer Paternal Aunt    Hypertension Maternal Aunt    Heart attack Paternal Uncle        2 uncles   Stroke Maternal Grandmother        TIA's   Colon cancer Paternal Grandmother    Stomach cancer Paternal Grandmother     Social History Social History   Tobacco Use   Smoking status: Never   Smokeless tobacco: Never  Vaping Use   Vaping Use: Never used  Substance Use Topics   Alcohol use: Not Currently   Drug use: No     Allergies   Patient has no known allergies.   Review of Systems Review of Systems  HENT:  Positive for congestion and sore throat.   Respiratory:  Positive for cough.   All other systems reviewed and are negative.    Physical Exam Triage Vital Signs ED Triage Vitals  Enc Vitals Group     BP 03/24/22 1552 (!) 140/93     Pulse Rate 03/24/22 1552 95     Resp 03/24/22 1552 16     Temp 03/24/22 1552 99.5 F (37.5 C)     Temp Source 03/24/22 1552 Oral     SpO2 03/24/22 1552 100 %     Weight 03/24/22 1555 208 lb (94.3 kg)     Height 03/24/22 1555 '5\' 8"'$  (1.727 m)     Head Circumference --      Peak Flow --      Pain Score 03/24/22 1554 4     Pain Loc --      Pain Edu? --      Excl. in [place]? --    No data found.  Updated Vital Signs BP (!) 140/93 (BP Location: Left Arm)   Pulse 95   Temp 99.5 F (37.5 C) (Oral)   Resp 16   Ht '5\' 8"'$  (1.727 m)   Wt 208 lb (94.3 kg)   LMP 03/13/2022 (Exact Date)   SpO2 100%   BMI 31.63 kg/m      Physical Exam Vitals and nursing note reviewed.  Constitutional:      Appearance: She is well-developed and normal weight.  HENT:     Head: Normocephalic and atraumatic.     Right Ear: Tympanic membrane and ear canal normal.     Left Ear: Tympanic membrane and ear canal normal.     Mouth/Throat:     Mouth: Mucous membranes are moist.     Pharynx:  Oropharynx is clear. Uvula midline. Posterior oropharyngeal erythema and uvula swelling present.  Eyes:      Conjunctiva/sclera: Conjunctivae normal.     Pupils: Pupils are equal, round, and reactive to light.  Cardiovascular:     Rate and Rhythm: Normal rate and regular rhythm.     Heart sounds: Normal heart sounds. No murmur heard. Pulmonary:     Effort: Pulmonary effort is normal.     Breath sounds: Normal breath sounds.  Abdominal:     General: Bowel sounds are normal.     Palpations: Abdomen is soft.  Musculoskeletal:     Cervical back: Normal range of motion and neck supple.  Skin:    General: Skin is warm and dry.  Neurological:     General: No focal deficit present.     Mental Status: She is alert and oriented to person, place, and time.      UC Treatments / Results  Labs (all labs ordered are listed, but only abnormal results are displayed) Labs Reviewed  RESP PANEL BY RT-PCR (FLU A&B, COVID) [id number]    EKG   Radiology No results found.  Procedures Procedures (including critical care time)  Medications Ordered in UC Medications - No data to display  Initial Impression / Assessment and Plan / UC Course  I have reviewed the triage vital signs and the nursing notes.  Pertinent labs & imaging results that were available during my care of the patient were reviewed by me and considered in my medical decision making (see chart for details).     MDM: 1.  Pharyngitis, unspecified etiology-Rx'd Zithromax, lab 10093 ordered. Advised patient to take medication as directed with food to completion.  Encouraged patient increase daily water intake while taking this medication.  Advised patient we will follow-up with COVID-19/Influenza results once received. Work note provided to patient prior to discharge.  Patient discharged home, hemodynamically stable. Final Clinical Impressions(s) / UC Diagnoses   Final diagnoses:  Pharyngitis, unspecified etiology     Discharge Instructions      Advised patient to take medication as directed with food to completion.  Encouraged  patient increase daily water intake while taking this medication.  Advised patient we will follow-up with COVID-19/influenza results once received.     ED Prescriptions     Medication Sig Dispense Auth. Provider   azithromycin (ZITHROMAX) 250 MG tablet Take 1 tablet (250 mg total) by mouth daily. Take first 2 tablets together, then 1 every day until finished. 6 tablet Eliezer Lofts, FNP      PDMP not reviewed this encounter.   Eliezer Lofts, [place] 03/24/22 1622

## 2022-03-29 ENCOUNTER — Encounter (INDEPENDENT_AMBULATORY_CARE_PROVIDER_SITE_OTHER): Payer: BC Managed Care – PPO

## 2022-03-29 DIAGNOSIS — D250 Submucous leiomyoma of uterus: Secondary | ICD-10-CM | POA: Diagnosis not present

## 2022-03-29 DIAGNOSIS — R052 Subacute cough: Secondary | ICD-10-CM

## 2022-03-29 DIAGNOSIS — D251 Intramural leiomyoma of uterus: Secondary | ICD-10-CM | POA: Diagnosis not present

## 2022-03-29 DIAGNOSIS — N920 Excessive and frequent menstruation with regular cycle: Secondary | ICD-10-CM | POA: Diagnosis not present

## 2022-03-29 MED ORDER — HYDROCODONE BIT-HOMATROP MBR 5-1.5 MG/5ML PO SOLN: 5.0000 mL | Freq: Every evening | ORAL | 0 refills | Status: DC | PRN

## 2022-03-29 NOTE — Telephone Encounter (Signed)

## 2022-03-29 NOTE — Telephone Encounter (Signed)
Received the following message via MyChart appt request:  Message    ----- Message -----  From: Mal Misty  Sent: 03/29/2022  10:25 AM EST  To: [facility] Admin Pool  Subject: Appointment Request                              Good morning,    I went to urgent care on Friday and I was prescribed azithromycin. It has been helpful, however I'm still coughing profusely at night time, making it very difficult to get a restful nights sleep. Being that I experience these same symptoms annually, I asked the practitioner to also prescribe me cough syrup (robutussin and Codiene) to help with the cough. He declined and I was instructed to finish the antibiotic and then see how I am feeling. During the day, I'm better, but at night I'm still coughing and I'm congested. The nurse told me to contact my doctor at the end of the prescription to ask about the cough syrup prescription if the coughing hasn't resolved. Are you able to follow up this request?

## 2022-04-20 ENCOUNTER — Encounter: Payer: Self-pay | Admitting: Cardiology

## 2022-04-26 ENCOUNTER — Ambulatory Visit: Payer: BC Managed Care – PPO | Admitting: Family Medicine

## 2022-04-26 ENCOUNTER — Encounter: Payer: Self-pay | Admitting: Family Medicine

## 2022-04-26 VITALS — BP 132/74 | HR 108 | Temp 98.7°F | Ht 68.0 in | Wt 222.0 lb

## 2022-04-26 DIAGNOSIS — J111 Influenza due to unidentified influenza virus with other respiratory manifestations: Secondary | ICD-10-CM | POA: Diagnosis not present

## 2022-04-26 DIAGNOSIS — J069 Acute upper respiratory infection, unspecified: Secondary | ICD-10-CM

## 2022-04-26 DIAGNOSIS — I10 Essential (primary) hypertension: Secondary | ICD-10-CM

## 2022-04-26 DIAGNOSIS — R600 Localized edema: Secondary | ICD-10-CM | POA: Diagnosis not present

## 2022-04-26 LAB — POCT INFLUENZA A/B
Influenza A, POC: POSITIVE — AB
Influenza B, POC: NEGATIVE

## 2022-04-26 MED ORDER — HYDROCHLOROTHIAZIDE 12.5 MG PO CAPS: 12.5000 mg | ORAL_CAPSULE | Freq: Every day | ORAL | 0 refills | Status: DC

## 2022-04-26 MED ORDER — OSELTAMIVIR PHOSPHATE 75 MG PO CAPS: 75.0000 mg | ORAL_CAPSULE | Freq: Two times a day (BID) | ORAL | 0 refills | Status: DC

## 2022-04-26 NOTE — Progress Notes (Signed)
Pt reports that her sxs returned on Monday. She c/o sore throat,cough body aches, chills on/off x 2 days, runny nose and L ear pain. She said that last night she has some chest pain from her heart rate being elevated. She said that this lasted for 45 mins to 1 hour. She states that 1 of her coworkers tested positive for Covid last week. She did test for Covid yesterday and it was Negative. She has only been taking Zyrtec for her sxs.  Pt said that she stopped the Amlodipine on 12/25 due to bilateral leg swelling and started taking HCTZ on 12/26.

## 2022-04-26 NOTE — Progress Notes (Signed)
Acute Office Visit  Subjective:     Patient ID: [name], female    DOB: [birth year], 42 y.o.   MRN: [id number]  Chief Complaint  Patient presents with   URI         HPI Patient is in today for upper respiratory symptoms.  She reports 2 days of sore throat, hoarseness, runny nose cough fatigue chills and sweats.  No GI symptoms.  She did do a COVID test through work yesterday and it was negative.  Wanted to discuss her blood pressure.  She consulted with cardiology because she is dealing with fibroids but also still trying to get pregnant.  And they took her off her hydrochlorothiazide and switched her to amlodipine and then increase her dose to 10 mg.  But over the holidays she was doing a lot of standing and moving around and started to get significant swelling of both ankles.  She ended up stopping the amlodipine around Christmas day and restarted her HCTZ and has been gradually getting better.  She has been keeping her ankles elevated.  But she also has a lot of tenderness particularly on her left ankle and top of the foot.  It is actually most painful.  She is not planning on trying to get pregnant between now and February when she has a procedure to surgically treat her fibroids.   ROS      Objective:    BP 132/74   Pulse (!) 108   Temp 98.7 F (37.1 C)   Ht '5\' 8"'$  (1.727 m)   Wt 222 lb (100.7 kg)   SpO2 95%   BMI 33.75 kg/m    Physical Exam Constitutional:      Appearance: Normal appearance. She is well-developed.  HENT:     Head: Normocephalic and atraumatic.     Right Ear: Tympanic membrane, ear canal and external ear normal.     Left Ear: Tympanic membrane, ear canal and external ear normal.     Nose: Nose normal.     Mouth/Throat:     Pharynx: Oropharynx is clear.  Eyes:     Conjunctiva/sclera: Conjunctivae normal.     Pupils: Pupils are equal, round, and reactive to light.  Neck:     Thyroid: No thyromegaly.  Cardiovascular:     Rate and  Rhythm: Normal rate and regular rhythm.     Heart sounds: Normal heart sounds.  Pulmonary:     Effort: Pulmonary effort is normal.     Breath sounds: Normal breath sounds. No wheezing.  Musculoskeletal:     Cervical back: Neck supple.  Lymphadenopathy:     Cervical: No cervical adenopathy.  Skin:    General: Skin is warm and dry.  Neurological:     Mental Status: She is alert and oriented to person, place, and time.    Results for orders placed or performed in visit on 04/26/22  POCT Influenza A/B  Result Value Ref Range   Influenza A, POC Positive (A) Negative   Influenza B, POC Negative Negative        Assessment & Plan:   Problem List Items Addressed This Visit       Cardiovascular and Mediastinum   HYPERTENSION, BENIGN - Primary   Relevant Medications   hydrochlorothiazide (MICROZIDE) 12.5 MG capsule   Other Visit Diagnoses     Viral upper respiratory tract infection       Relevant Medications   oseltamivir (TAMIFLU) 75 MG capsule   Other Relevant Orders  POCT Influenza A/B (Completed)   Lower extremity edema       Relevant Orders   COMPLETE METABOLIC PANEL WITH GFR   TSH   Urine Microalbumin w/creat. ratio   Influenza       Relevant Medications   oseltamivir (TAMIFLU) 75 MG capsule      Flu A -within thirds first 3 days of symptoms we will go ahead and treat with Tamiflu.  Okay to continue symptomatic care.  If not better in 1 week or developing new or worsening symptoms then please let us know.  Lower extremity edema-okay to continue with HCTZ for now it sounds like the swelling is gradually improving.  Encouraged her to consider restarting half a tab of the amlodipine once the swelling is better.  After her procedure in February then she can decide if she wants to come back off the HCTZ especially if she is going to try to get pregnant again.   Meds ordered this encounter  Medications   oseltamivir (TAMIFLU) 75 MG capsule    Sig: Take 1 capsule (75  mg total) by mouth 2 (two) times daily.    Dispense:  10 capsule    Refill:  0   hydrochlorothiazide (MICROZIDE) 12.5 MG capsule    Sig: Take 1 capsule (12.5 mg total) by mouth daily.    Dispense:  90 capsule    Refill:  0    Return if symptoms worsen or fail to improve.  Beatrice Lecher, MD

## 2022-04-27 LAB — COMPLETE METABOLIC PANEL WITH GFR
AG Ratio: 1.1 (calc) (ref 1.0–2.5)
ALT: 11 U/L (ref 6–29)
AST: 12 U/L (ref 10–30)
Albumin: 4 g/dL (ref 3.6–5.1)
Alkaline phosphatase (APISO): 76 U/L (ref 31–125)
BUN: 9 mg/dL (ref 7–25)
CO2: 26 mmol/L (ref 20–32)
Calcium: 9.3 mg/dL (ref 8.6–10.2)
Chloride: 103 mmol/L (ref 98–110)
Creat: 0.67 mg/dL (ref 0.50–0.99)
Globulin: 3.7 g/dL (calc) (ref 1.9–3.7)
Glucose, Bld: 89 mg/dL (ref 65–99)
Potassium: 3.9 mmol/L (ref 3.5–5.3)
Sodium: 136 mmol/L (ref 135–146)
Total Bilirubin: 0.2 mg/dL (ref 0.2–1.2)
Total Protein: 7.7 g/dL (ref 6.1–8.1)
eGFR: 112 mL/min/{1.73_m2} (ref >=60)

## 2022-04-27 LAB — MICROALBUMIN / CREATININE URINE RATIO
Creatinine, Urine: 78 mg/dL (ref 20–275)
Microalb Creat Ratio: 5 mcg/mg creat (ref <30)
Microalb, Ur: 0.4 mg/dL

## 2022-04-27 LAB — TSH: TSH: 1.14 mIU/L

## 2022-05-25 DIAGNOSIS — N920 Excessive and frequent menstruation with regular cycle: Secondary | ICD-10-CM | POA: Diagnosis not present

## 2022-05-25 DIAGNOSIS — D251 Intramural leiomyoma of uterus: Secondary | ICD-10-CM | POA: Diagnosis not present

## 2022-05-25 DIAGNOSIS — Z01818 Encounter for other preprocedural examination: Secondary | ICD-10-CM | POA: Diagnosis not present

## 2022-05-25 DIAGNOSIS — D250 Submucous leiomyoma of uterus: Secondary | ICD-10-CM | POA: Diagnosis not present

## 2022-05-31 DIAGNOSIS — G8918 Other acute postprocedural pain: Secondary | ICD-10-CM | POA: Diagnosis not present

## 2022-05-31 DIAGNOSIS — R1030 Lower abdominal pain, unspecified: Secondary | ICD-10-CM | POA: Diagnosis not present

## 2022-05-31 DIAGNOSIS — D252 Subserosal leiomyoma of uterus: Secondary | ICD-10-CM | POA: Diagnosis not present

## 2022-05-31 DIAGNOSIS — D259 Leiomyoma of uterus, unspecified: Secondary | ICD-10-CM | POA: Diagnosis not present

## 2022-05-31 DIAGNOSIS — N736 Female pelvic peritoneal adhesions (postinfective): Secondary | ICD-10-CM | POA: Diagnosis not present

## 2022-05-31 DIAGNOSIS — Z98891 History of uterine scar from previous surgery: Secondary | ICD-10-CM | POA: Diagnosis not present

## 2022-05-31 DIAGNOSIS — Z9889 Other specified postprocedural states: Secondary | ICD-10-CM | POA: Diagnosis not present

## 2022-05-31 DIAGNOSIS — D250 Submucous leiomyoma of uterus: Secondary | ICD-10-CM | POA: Diagnosis not present

## 2022-05-31 DIAGNOSIS — Z79899 Other long term (current) drug therapy: Secondary | ICD-10-CM | POA: Diagnosis not present

## 2022-05-31 DIAGNOSIS — D251 Intramural leiomyoma of uterus: Secondary | ICD-10-CM | POA: Diagnosis not present

## 2022-05-31 DIAGNOSIS — D62 Acute posthemorrhagic anemia: Secondary | ICD-10-CM | POA: Diagnosis not present

## 2022-05-31 DIAGNOSIS — I10 Essential (primary) hypertension: Secondary | ICD-10-CM | POA: Diagnosis not present

## 2022-06-02 DIAGNOSIS — R1030 Lower abdominal pain, unspecified: Secondary | ICD-10-CM | POA: Diagnosis not present

## 2022-06-02 DIAGNOSIS — Z9889 Other specified postprocedural states: Secondary | ICD-10-CM | POA: Diagnosis not present

## 2022-06-02 DIAGNOSIS — D259 Leiomyoma of uterus, unspecified: Secondary | ICD-10-CM | POA: Diagnosis not present

## 2022-06-05 ENCOUNTER — Telehealth: Payer: Self-pay

## 2022-06-05 DIAGNOSIS — Z3169 Encounter for other general counseling and advice on procreation: Secondary | ICD-10-CM | POA: Diagnosis not present

## 2022-06-05 NOTE — Transitions of Care (Post Inpatient/ED Visit) (Unsigned)
   06/05/2022  Name: Viola Kinnick MRN: [id number] DOB: Aug 30, 1980  Today's TOC FU Call Status: Today's TOC FU Call Status:: Unsuccessul Call (1st Attempt) Unsuccessful Call (1st Attempt) Date: 06/05/22  Attempted to reach the patient regarding the most recent Inpatient/ED visit.  Follow Up Plan: Additional outreach attempts will be made to reach the patient to complete the Transitions of Care (Post Inpatient/ED visit) call.   Signature Juanda Crumble, [place] Direct Dial [phone]

## 2022-06-06 NOTE — Transitions of Care (Post Inpatient/ED Visit) (Signed)
   06/06/2022  Name: [name] MRN: [id number] DOB: [birth year]  Today's TOC FU Call Status: Today's TOC FU Call Status:: Successful TOC FU Call Competed Unsuccessful Call (1st Attempt) Date: 06/05/22 [facility] FU Call Complete Date: 06/06/22  Transition Care Management Follow-up Telephone Call Date of Discharge: 06/03/22 Discharge Facility: Other Facility Name of Other Discharge Facility: [facility] Type of Discharge: Inpatient Admission Primary Inpatient Discharge Diagnosis:: abd Pain How have you been since you were released from the hospital?: Better Any questions or concerns?: No  Items Reviewed: Did you receive and understand the discharge instructions provided?: Yes Medications obtained and verified?: Yes (Medications Reviewed) Any new allergies since your discharge?: No Dietary orders reviewed?: NA Do you have support at home?: Yes People in Home: spouse  Home Care and Equipment/Supplies: [place] Ordered?: NA Any new equipment or medical supplies ordered?: NA  Functional Questionnaire: Do you need assistance with bathing/showering or dressing?: No Do you need assistance with meal preparation?: No Do you need assistance with eating?: No Do you have difficulty maintaining continence: No Do you need assistance with getting out of bed/getting out of a chair/moving?: No Do you have difficulty managing or taking your medications?: No  Folllow up appointments reviewed: PCP Follow-up appointment confirmed?: NA Specialist Hospital Follow-up appointment confirmed?: Yes Date of Specialist follow-up appointment?: 07/10/22 Follow-Up Specialty Provider:: OBGYN Do you need transportation to your follow-up appointment?: No Do you understand care options if your condition(s) worsen?: Yes-patient verbalized understanding    SIGNATURE Juanda Crumble, [place] Nurse Health Advisor Direct Dial [phone]

## 2022-07-10 ENCOUNTER — Other Ambulatory Visit: Payer: Self-pay | Admitting: Family Medicine

## 2022-07-10 DIAGNOSIS — D500 Iron deficiency anemia secondary to blood loss (chronic): Secondary | ICD-10-CM | POA: Diagnosis not present

## 2022-07-10 DIAGNOSIS — Z789 Other specified health status: Secondary | ICD-10-CM | POA: Diagnosis not present

## 2022-07-10 DIAGNOSIS — Z3141 Encounter for fertility testing: Secondary | ICD-10-CM | POA: Diagnosis not present

## 2022-07-10 DIAGNOSIS — Z1231 Encounter for screening mammogram for malignant neoplasm of breast: Secondary | ICD-10-CM

## 2022-07-10 DIAGNOSIS — Z1371 Encounter for nonprocreative screening for genetic disease carrier status: Secondary | ICD-10-CM | POA: Diagnosis not present

## 2022-07-11 ENCOUNTER — Ambulatory Visit (INDEPENDENT_AMBULATORY_CARE_PROVIDER_SITE_OTHER): Payer: BC Managed Care – PPO

## 2022-07-11 DIAGNOSIS — Z1231 Encounter for screening mammogram for malignant neoplasm of breast: Secondary | ICD-10-CM

## 2022-07-12 NOTE — Progress Notes (Signed)
Please call patient. Normal mammogram.  Repeat in 1 year.  

## 2022-07-14 ENCOUNTER — Encounter: Payer: Self-pay | Admitting: Family Medicine

## 2022-07-14 MED ORDER — NEOMYCIN-POLYMYXIN-DEXAMETH 0.1 % OP OINT: 1.0000 | TOPICAL_OINTMENT | Freq: Every day | OPHTHALMIC | 1 refills | Status: AC

## 2022-08-21 ENCOUNTER — Other Ambulatory Visit: Payer: Self-pay | Admitting: *Deleted

## 2022-08-21 MED ORDER — AMLODIPINE BESYLATE 10 MG PO TABS: 10.0000 mg | ORAL_TABLET | Freq: Every day | ORAL | 0 refills | Status: DC

## 2022-08-30 DIAGNOSIS — D259 Leiomyoma of uterus, unspecified: Secondary | ICD-10-CM | POA: Diagnosis not present

## 2022-08-30 DIAGNOSIS — D72819 Decreased white blood cell count, unspecified: Secondary | ICD-10-CM | POA: Diagnosis not present

## 2022-09-15 ENCOUNTER — Other Ambulatory Visit: Payer: Self-pay | Admitting: Family

## 2022-10-03 ENCOUNTER — Other Ambulatory Visit: Payer: Self-pay | Admitting: *Deleted

## 2022-10-03 MED ORDER — AMLODIPINE BESYLATE 10 MG PO TABS: 10.0000 mg | ORAL_TABLET | Freq: Every day | ORAL | 0 refills | Status: DC

## 2022-10-05 DIAGNOSIS — D250 Submucous leiomyoma of uterus: Secondary | ICD-10-CM | POA: Diagnosis not present

## 2022-10-06 ENCOUNTER — Encounter: Payer: Self-pay | Admitting: Family Medicine

## 2022-10-06 MED ORDER — AMLODIPINE BESYLATE 10 MG PO TABS: 10.0000 mg | ORAL_TABLET | Freq: Every day | ORAL | 0 refills | Status: DC

## 2022-10-16 DIAGNOSIS — Z3141 Encounter for fertility testing: Secondary | ICD-10-CM | POA: Diagnosis not present

## 2022-11-03 DIAGNOSIS — B3731 Acute candidiasis of vulva and vagina: Secondary | ICD-10-CM | POA: Diagnosis not present

## 2022-11-16 DIAGNOSIS — Q5128 Other doubling of uterus, other specified: Secondary | ICD-10-CM | POA: Diagnosis not present

## 2022-11-16 DIAGNOSIS — N856 Intrauterine synechiae: Secondary | ICD-10-CM | POA: Diagnosis not present

## 2022-11-16 DIAGNOSIS — D250 Submucous leiomyoma of uterus: Secondary | ICD-10-CM | POA: Diagnosis not present

## 2022-11-17 ENCOUNTER — Ambulatory Visit: Payer: BC Managed Care – PPO | Admitting: Family Medicine

## 2022-11-23 DIAGNOSIS — Z9889 Other specified postprocedural states: Secondary | ICD-10-CM | POA: Diagnosis not present

## 2022-11-28 ENCOUNTER — Encounter: Payer: Self-pay | Admitting: Hematology & Oncology

## 2022-12-05 ENCOUNTER — Ambulatory Visit (INDEPENDENT_AMBULATORY_CARE_PROVIDER_SITE_OTHER): Payer: BC Managed Care – PPO | Admitting: Family Medicine

## 2022-12-05 ENCOUNTER — Encounter: Payer: Self-pay | Admitting: Hematology & Oncology

## 2022-12-05 VITALS — BP 142/90 | HR 100 | Ht 68.0 in | Wt 222.0 lb

## 2022-12-05 DIAGNOSIS — R7301 Impaired fasting glucose: Secondary | ICD-10-CM | POA: Diagnosis not present

## 2022-12-05 DIAGNOSIS — I10 Essential (primary) hypertension: Secondary | ICD-10-CM | POA: Diagnosis not present

## 2022-12-05 DIAGNOSIS — E611 Iron deficiency: Secondary | ICD-10-CM

## 2022-12-05 DIAGNOSIS — D509 Iron deficiency anemia, unspecified: Secondary | ICD-10-CM

## 2022-12-05 DIAGNOSIS — D259 Leiomyoma of uterus, unspecified: Secondary | ICD-10-CM

## 2022-12-05 DIAGNOSIS — O3410 Maternal care for benign tumor of corpus uteri, unspecified trimester: Secondary | ICD-10-CM | POA: Diagnosis not present

## 2022-12-05 DIAGNOSIS — Z3A00 Weeks of gestation of pregnancy not specified: Secondary | ICD-10-CM

## 2022-12-05 DIAGNOSIS — Z6833 Body mass index (BMI) 33.0-33.9, adult: Secondary | ICD-10-CM | POA: Insufficient documentation

## 2022-12-05 LAB — POCT GLYCOSYLATED HEMOGLOBIN (HGB A1C): Hemoglobin A1C: 5.2 % (ref 4.0–5.6)

## 2022-12-05 NOTE — Assessment & Plan Note (Signed)
Under active treatment and hoping to have IUI later this fall.

## 2022-12-05 NOTE — Progress Notes (Signed)
Established Patient Office Visit  Subjective   Patient ID: [name], female    DOB: 18-Feb-1981  Age: 42 y.o. MRN: [id number]  Chief Complaint  Patient presents with   Hypertension    HPI  Hypertension- Pt denies chest pain, SOB, dizziness, or heart palpitations.  Taking meds as directed w/o problems.  Denies medication side effects.    Impaired fasting glucose-no increased thirst or urination. No symptoms consistent with hypoglycemia.  Just had hysteroscopy at end of July to break adhesions.  Ultimate goal is to try to get pregnant again.  She is currently on estrogen and then will start progesterone later this week.  She did have a brief episode of chest discomfort yesterday she is also noticed that she is little bit more short of breath in general similar to when her iron had dropped previously.  She has had multiple seizures and bleeding in between those procedures this year so she is concerned that she may be anemic again.  She has been exercising some intermittently but the procedures have made it difficult to be consistent this year and so she has been a little frustrated with her weight and would really like to work on weight loss.  Weight is stable actually from January so she has not gained but again would like to really lose.  Component Ref Range & Units 3 mo ago  WBC (White Blood Cell Count) 3.2 - 9.8 x10^9/L 3.3  Hemoglobin 11.7 - 15.5 g/dL 40.9  Hematocrit 81.1 - 45.0 % 35.4  Platelets 150 - 450 x10^9/L 262  MCV (Mean Corpuscular Volume) 80 - 98 fL 91  MCH (Mean Corpuscular Hemoglobin) 26.5 - 34.0 pg 30.7  MCHC (Mean Corpuscular Hemoglobin Concentration) 31.0 - 36.0 % 33.6  RBC (Red Blood Cell Count) 3.77 - 5.16 x10^12/L 3.88  RDW-CV (Red Cell Distribution Width) 11.5 - 14.5 % 12.6  MPV (Mean Platelet Volume) 7.2 - 11.7 fL 8.7      ROS    Objective:     BP (!) 142/90   Pulse 100   Ht 5\' 8"  (1.727 m)   Wt 222 lb (100.7 kg)   SpO2 100%    BMI 33.75 kg/m    Physical Exam Vitals and nursing note reviewed.  Constitutional:      Appearance: She is well-developed.  HENT:     Head: Normocephalic and atraumatic.  Cardiovascular:     Rate and Rhythm: Normal rate and regular rhythm.     Heart sounds: Normal heart sounds.  Pulmonary:     Effort: Pulmonary effort is normal.     Breath sounds: Normal breath sounds.  Skin:    General: Skin is warm and dry.  Neurological:     Mental Status: She is alert and oriented to person, place, and time.  Psychiatric:        Behavior: Behavior normal.      Results for orders placed or performed in visit on 12/05/22  POCT glycosylated hemoglobin (Hb A1C)  Result Value Ref Range   Hemoglobin A1C 5.2 4.0 - 5.6 %   HbA1c POC (<> result, manual entry)     HbA1c, POC (prediabetic range)     HbA1c, POC (controlled diabetic range)        The ASCVD Risk score (Arnett DK, et al., 2019) failed to calculate for the following reasons:   Cannot find a previous HDL lab   Cannot find a previous total cholesterol lab    Assessment & Plan:  Problem List Items Addressed This Visit       Cardiovascular and Mediastinum   HYPERTENSION, BENIGN    Sure not at goal today.  Will have her come back in 2 weeks for nurse visit if still not at goal then we will need to make an adjustment to the regimen.  Continue to work on [employer] and increasing activity which also helps lower blood pressure.  She is trying to actively get pregnant so we will need to stay away from ACE inhibitors and ARB's.      Relevant Orders   CBC   Lipid panel   Fe+TIBC+Fer     Endocrine   IFG (impaired fasting glucose) - Primary    1C looks absolutely phenomenal today.  Plan to recheck again in 6 to 12 months.      Relevant Orders   POCT glycosylated hemoglobin (Hb A1C) (Completed)   CBC   Lipid panel   Fe+TIBC+Fer     Genitourinary   Uterine fibroids affecting pregnancy    Under active treatment  and hoping to have IUI later this fall.        Other   Iron deficiency anemia    History of iron deficiency she is starting to feel some typical symptoms for her low iron so we will check iron levels today.      BMI 33.0-33.9,adult    Urged her to consider using a calorie tracking app such as my fitness pal or lose it.  She is already been working out in the gym pretty regularly lately she just has not been able to be as consistent over the last 6 to 9 months because of her surgeries.  Also encouraged her to consider a weight loss management program such as healthy weight and wellness which is also offered here locally in [place] through [facility].  I would not recommend prescription medication at this time because she is trying to get pregnant the metformin could be helpful and she might want to talk to her GYN about it.      Other Visit Diagnoses     Iron deficiency       Relevant Orders   CBC   Lipid panel   Fe+TIBC+Fer       Return in about 6 months (around 06/07/2023) for bp/ifg.    Nani Gasser, MD

## 2022-12-05 NOTE — Assessment & Plan Note (Addendum)
History of iron deficiency she is starting to feel some typical symptoms for her low iron so we will check iron levels today.

## 2022-12-05 NOTE — Assessment & Plan Note (Signed)
Sure not at goal today.  Will have her come back in 2 weeks for nurse visit if still not at goal then we will need to make an adjustment to the regimen.  Continue to work on [employer] and increasing activity which also helps lower blood pressure.  She is trying to actively get pregnant so we will need to stay away from ACE inhibitors and ARB's.

## 2022-12-05 NOTE — Patient Instructions (Signed)
Follow up in 2 weeks for nurse visit for bp check ?

## 2022-12-05 NOTE — Assessment & Plan Note (Signed)
1C looks absolutely phenomenal today.  Plan to recheck again in 6 to 12 months.

## 2022-12-05 NOTE — Assessment & Plan Note (Signed)
Urged her to consider using a calorie tracking app such as my fitness pal or lose it.  She is already been working out in the gym pretty regularly lately she just has not been able to be as consistent over the last 6 to 9 months because of her surgeries.  Also encouraged her to consider a weight loss management program such as healthy weight and wellness which is also offered here locally in [place] through [facility].  I would not recommend prescription medication at this time because she is trying to get pregnant the metformin could be helpful and she might want to talk to her GYN about it.

## 2022-12-20 ENCOUNTER — Ambulatory Visit: Payer: BC Managed Care – PPO | Admitting: Family Medicine

## 2022-12-20 DIAGNOSIS — E611 Iron deficiency: Secondary | ICD-10-CM | POA: Diagnosis not present

## 2022-12-20 DIAGNOSIS — I10 Essential (primary) hypertension: Secondary | ICD-10-CM | POA: Diagnosis not present

## 2022-12-20 DIAGNOSIS — R7301 Impaired fasting glucose: Secondary | ICD-10-CM | POA: Diagnosis not present

## 2022-12-21 LAB — LIPID PANEL: Chol/HDL Ratio: 2.7 ratio (ref 0.0–4.4)

## 2022-12-21 LAB — IRON,TIBC AND FERRITIN PANEL: Iron: 54 ug/dL (ref 27–159)

## 2022-12-22 ENCOUNTER — Other Ambulatory Visit: Payer: Self-pay | Admitting: Family Medicine

## 2022-12-22 MED ORDER — LOSARTAN POTASSIUM-HCTZ 50-12.5 MG PO TABS
1.0000 | ORAL_TABLET | Freq: Every day | ORAL | 1 refills | Status: AC
Start: 2022-12-22 — End: ?

## 2022-12-22 NOTE — Addendum Note (Signed)
Addended by: Nani Gasser D on: 12/22/2022 12:38 PM   Modules accepted: Orders

## 2022-12-22 NOTE — Progress Notes (Signed)
Based on her las pressure we can make an adjustment and then recheck in 2 weeks with nurse visit. We can change the hydrochlorothiazide to lisinopril hydrochlorothiazide and continue with the same dose os amlodipine. I will send over new RX.

## 2022-12-22 NOTE — Progress Notes (Signed)
[name], hemoglobin looks great this time!  Your white blood cells are little borderline low but normal for you.  Your iron level is technically in the normal range but it is still on the lower end your ferritin also looks better but still on the lower end so just continue with any extra iron supplementation that you are taking.  Cholesterol looks great.

## 2022-12-27 DIAGNOSIS — D259 Leiomyoma of uterus, unspecified: Secondary | ICD-10-CM | POA: Diagnosis not present

## 2023-01-03 DIAGNOSIS — D259 Leiomyoma of uterus, unspecified: Secondary | ICD-10-CM | POA: Diagnosis not present

## 2023-02-01 DIAGNOSIS — D250 Submucous leiomyoma of uterus: Secondary | ICD-10-CM | POA: Diagnosis not present

## 2023-02-27 DIAGNOSIS — D250 Submucous leiomyoma of uterus: Secondary | ICD-10-CM | POA: Diagnosis not present

## 2023-02-27 DIAGNOSIS — N856 Intrauterine synechiae: Secondary | ICD-10-CM | POA: Diagnosis not present

## 2023-02-28 ENCOUNTER — Other Ambulatory Visit: Payer: Self-pay | Admitting: Family Medicine

## 2023-02-28 DIAGNOSIS — E039 Hypothyroidism, unspecified: Secondary | ICD-10-CM | POA: Diagnosis not present

## 2023-02-28 DIAGNOSIS — E559 Vitamin D deficiency, unspecified: Secondary | ICD-10-CM | POA: Diagnosis not present

## 2023-02-28 DIAGNOSIS — E669 Obesity, unspecified: Secondary | ICD-10-CM | POA: Diagnosis not present

## 2023-02-28 DIAGNOSIS — B009 Herpesviral infection, unspecified: Secondary | ICD-10-CM

## 2023-02-28 DIAGNOSIS — N959 Unspecified menopausal and perimenopausal disorder: Secondary | ICD-10-CM | POA: Diagnosis not present

## 2023-02-28 DIAGNOSIS — R7303 Prediabetes: Secondary | ICD-10-CM | POA: Diagnosis not present

## 2023-02-28 DIAGNOSIS — E538 Deficiency of other specified B group vitamins: Secondary | ICD-10-CM | POA: Diagnosis not present

## 2023-02-28 DIAGNOSIS — R5383 Other fatigue: Secondary | ICD-10-CM | POA: Diagnosis not present

## 2023-02-28 DIAGNOSIS — Z6833 Body mass index (BMI) 33.0-33.9, adult: Secondary | ICD-10-CM | POA: Diagnosis not present

## 2023-03-03 ENCOUNTER — Encounter: Payer: Self-pay | Admitting: Family Medicine

## 2023-03-12 ENCOUNTER — Telehealth: Payer: Self-pay | Admitting: *Deleted

## 2023-03-12 NOTE — Telephone Encounter (Signed)
Returned call from 3:40 PM. Left patient a message to call and schedule a New GYN with an MD.

## 2023-03-16 DIAGNOSIS — N83209 Unspecified ovarian cyst, unspecified side: Secondary | ICD-10-CM | POA: Diagnosis not present

## 2023-03-16 DIAGNOSIS — Z3141 Encounter for fertility testing: Secondary | ICD-10-CM | POA: Diagnosis not present

## 2023-03-28 DIAGNOSIS — B3731 Acute candidiasis of vulva and vagina: Secondary | ICD-10-CM | POA: Diagnosis not present

## 2023-03-28 DIAGNOSIS — N390 Urinary tract infection, site not specified: Secondary | ICD-10-CM | POA: Diagnosis not present

## 2023-03-30 ENCOUNTER — Other Ambulatory Visit: Payer: Self-pay | Admitting: Hematology & Oncology

## 2023-04-05 ENCOUNTER — Encounter: Payer: Self-pay | Admitting: Obstetrics and Gynecology

## 2023-04-05 ENCOUNTER — Ambulatory Visit: Payer: BC Managed Care – PPO | Admitting: Obstetrics and Gynecology

## 2023-04-05 VITALS — BP 145/86 | HR 92 | Ht 65.0 in | Wt 222.0 lb

## 2023-04-05 DIAGNOSIS — Z01419 Encounter for gynecological examination (general) (routine) without abnormal findings: Secondary | ICD-10-CM | POA: Diagnosis not present

## 2023-04-05 DIAGNOSIS — Z3169 Encounter for other general counseling and advice on procreation: Secondary | ICD-10-CM

## 2023-04-05 NOTE — Progress Notes (Signed)
ANNUAL EXAM Patient name: [name] MRN [id number]  Date of birth: 1981/03/13 Chief Complaint:   followup ovarian cyst Annual (cyst resolved on subsequent imaging) History of Present Illness:   [name] is a 42 y.o. [phone] female being seen today for a routine annual exam. She is establishing for in the event she becomes pregnant and this was as recommended by the REI at [facility].   She is currently undergoing extensive testing and work up with REI with [facility].   She had an [place] on 10/17 which showed a 7 cm cyst but it has since resolved by [place] on 11/22.   She has had multiple myomectomies - 2009 and 2024. In 2024, 24 fibroids were removed. She has also has hysteroscopic resection of fibroids in 01/2023. She had a post-surgical HSG which showed right tube slightly dilated but dye spill and left tube is obstructed. Multiple small submucosal fibroids noted including near the cornua.    Doing Clomid and IUI this month. May do IVF but husband is hesitant. She is open to it.   Patient's last menstrual period was 03/03/2023.  Last Pap/Pap History:  12/2021:   Health Maintenance Due  Topic Date Due   Hepatitis C Screening  Never done   COVID-19 Vaccine (4 - 2024-25 season) 12/24/2022    Review of Systems:   Pertinent items are noted in HPI Denies any headaches, blurred vision, fatigue, shortness of breath, chest pain, abdominal pain, abnormal vaginal discharge/itching/odor/irritation, problems with periods, bowel movements, urination, or intercourse unless otherwise stated above.  Pertinent History Reviewed:  Reviewed past medical,surgical, social and family history.  Reviewed problem list, medications and allergies. Physical Assessment:   Vitals:   04/05/23 1517 04/05/23 1556  BP: (!) 143/94 (!) 145/86  Pulse: 98 92  Weight: 222 lb (100.7 kg)   Height: 5\' 5"  (1.651 m)   Body mass index is 36.94 kg/m.   Physical Examination:  General appearance - well appearing, and in  no distress Mental status - alert, oriented to person, place, and time Psych:  She has a normal mood and affect Skin - warm and dry, normal color, no suspicious lesions noted Chest - effort normal Heart - normal rate  Breasts - not examined Abdomen - soft, nontender, nondistended, no masses or organomegaly Pelvic -  Not examined Extremities:  No swelling or varicosities noted  Chaperone present for exam  No results found for this or any previous visit (from the past 24 hours).  Assessment & Plan:  Signe was seen today for followup ovarian cyst.  Diagnoses and all orders for this visit:  Encounter for annual routine gynecological examination - Cervical cancer screening: Discussed guidelines. Pap with HPV wnl 12/2021.  - STD Testing: Up to date through REI - Birth Control: TTC - Breast Health: Encouraged self breast awareness/SBE. Teaching provided. Discussed limits of clinical breast exam for detecting breast cancer. MXR is up to date: 06/2022 - F/U 12 months and prn  Encounter for preconception consultation Discussed with myomectomy if she does conceive, would do 1LTCS and would do it around 36-37 weeks. Discussed risk of accreta and management of this. She will contact us if/when pregnancy. We also discussed generally, she may want to do surgery for the fibroids and her HVB once she is done with TTC. We can discuss that as well once ready/postpartum. Reviewed I would not recommend surgery while still nursing if it can be avoided, especially if hysterectomy.    No orders of the defined types  were placed in this encounter.   Meds: No orders of the defined types were placed in this encounter.   Follow-up: Return in about 1 year (around 04/04/2024) for annual.  Milas Hock, MD 04/05/2023 4:08 PM

## 2023-04-10 DIAGNOSIS — Z3141 Encounter for fertility testing: Secondary | ICD-10-CM | POA: Diagnosis not present

## 2023-04-10 DIAGNOSIS — D251 Intramural leiomyoma of uterus: Secondary | ICD-10-CM | POA: Diagnosis not present

## 2023-04-10 DIAGNOSIS — D250 Submucous leiomyoma of uterus: Secondary | ICD-10-CM | POA: Diagnosis not present

## 2023-04-11 ENCOUNTER — Other Ambulatory Visit: Payer: Self-pay | Admitting: Obstetrics and Gynecology

## 2023-04-11 DIAGNOSIS — D250 Submucous leiomyoma of uterus: Secondary | ICD-10-CM

## 2023-04-16 ENCOUNTER — Other Ambulatory Visit: Payer: BC Managed Care – PPO

## 2023-04-16 DIAGNOSIS — N83292 Other ovarian cyst, left side: Secondary | ICD-10-CM | POA: Diagnosis not present

## 2023-04-16 DIAGNOSIS — D250 Submucous leiomyoma of uterus: Secondary | ICD-10-CM

## 2023-04-16 DIAGNOSIS — N83291 Other ovarian cyst, right side: Secondary | ICD-10-CM | POA: Diagnosis not present

## 2023-04-16 DIAGNOSIS — N852 Hypertrophy of uterus: Secondary | ICD-10-CM | POA: Diagnosis not present

## 2023-04-16 DIAGNOSIS — D251 Intramural leiomyoma of uterus: Secondary | ICD-10-CM | POA: Diagnosis not present

## 2023-04-16 MED ORDER — GADOBUTROL 1 MMOL/ML IV SOLN
10.0000 mL | Freq: Once | INTRAVENOUS | Status: AC | PRN
  Administered 2023-04-16: 10 mL via INTRAVENOUS

## 2023-04-23 ENCOUNTER — Other Ambulatory Visit: Payer: BC Managed Care – PPO

## 2023-05-09 DIAGNOSIS — D250 Submucous leiomyoma of uterus: Secondary | ICD-10-CM | POA: Diagnosis not present

## 2023-05-09 DIAGNOSIS — N978 Female infertility of other origin: Secondary | ICD-10-CM | POA: Diagnosis not present

## 2023-05-10 ENCOUNTER — Encounter: Payer: Self-pay | Admitting: Obstetrics and Gynecology

## 2023-05-23 NOTE — Progress Notes (Unsigned)
GYNECOLOGY OFFICE VISIT NOTE  History:   [name] is a 43 y.o. [phone] here today for hysterectomy consult.   She has heavy periods causing anemia in the setting of known fibroids. She has had one open abdominal myomectomy and two hysteroscopic myomectomies. Last we talked she was doing infertility treatments with REI through [facility]. Since that time, she has been told by the REI to consider other options such as donor eggs or adoption. They also suggested hysterectomy potentially for her bleeding.   Surgically, she has had the open myomectomy and 2 c-sections. I reviewed the operative note from her c-section in 2013 which did not note extensive scar tissue.   Discussed the use of AI scribe software for clinical note transcription with the patient, who gave verbal consent to proceed.  History of Present Illness   The patient, with a history of fibroids and heavy menstrual bleeding, presents for a discussion on management options. She has been through a long fertility journey and still has a strong desire to conceive. She has been considering a hysterectomy but is also would prefer other non-permanent options. She has been taking Myfembree in an attempt to shrink the fibroids and mostly stop bleeding. She had taken in the past when it was experimental and it helped her bleeding. The patient reports no improvement in her heavy menstrual bleeding with previous treatments, including tranexamic acid and birth control pills.   The patient is planning a trip to [place] and is considering further treatment options upon return in a couple weeks.          Past Medical History:  Diagnosis Date   Abnormal Pap smear 04/24/2002   colposcopy   Anemia    Herpes genitalia 04/24/2006   Last outbreak Nov 2012   History of blood transfusion 06/23/2021   2 units blood given without problems   Hypertension    2009   IBS (irritable bowel syndrome)    Menorrhagia    Pre-eclampsia    2011   Wears  glasses or glasses     Past Surgical History:  Procedure Laterality Date   CESAREAN SECTION  2011   CESAREAN SECTION  10/16/2011   Procedure: CESAREAN SECTION;  Surgeon: Catalina Antigua, MD;  Location: [facility];  Service: Gynecology;  Laterality: N/A;   COLPOSCOPY  2004   DILATATION & CURETTAGE/HYSTEROSCOPY WITH MYOSURE N/A 07/01/2021   Procedure: DILATATION & CURETTAGE/HYSTEROSCOPY WITH MYOSURE;  Surgeon: Maxie Better, MD;  Location: [facility];  Service: Gynecology;  Laterality: N/A;   DILATION AND CURETTAGE OF UTERUS     x2   DILATION AND CURETTAGE OF UTERUS     Miscarriages x 2   HYSTEROSCOPY  2021   LT breast biopsy     yrs ago benign   MYOMECTOMY ABDOMINAL APPROACH     yrs ago    The following portions of the patient's history were reviewed and updated as appropriate: allergies, current medications, past family history, past medical history, past social history, past surgical history and problem list.   Review of Systems:  Pertinent items noted in HPI and remainder of comprehensive ROS otherwise negative.  Physical Exam:  BP 134/88   Pulse 96   Ht 5\' 5"  (1.651 m)   Wt 225 lb (102.1 kg)   LMP 04/30/2023   BMI 37.44 kg/m  CONSTITUTIONAL: Well-developed, well-nourished female in no acute distress.  HEENT:  Normocephalic, atraumatic. External right and left ear normal. No scleral icterus.  NECK: Normal range of  motion, supple, no masses noted on observation SKIN: No rash noted. Not diaphoretic. No erythema. No pallor. MUSCULOSKELETAL: Normal range of motion. No edema noted. NEUROLOGIC: Alert and oriented to person, place, and time. Normal muscle tone coordination. No cranial nerve deficit noted. PSYCHIATRIC: Normal mood and affect. Normal behavior. Normal judgment and thought content.   PELVIC: Deferred  Labs and Imaging No results found for this or any previous visit (from the past week). No results found.  Assessment and Plan:   1. Uterine  leiomyoma, unspecified location (Primary) 2. Menorrhagia with regular cycle Severe menstrual bleeding. Discussed the risks/benefits of hysterectomy (bladder injury, ureter injury, recovery time) vs uterine artery embolization (potential pregnancy complications such as FGR, PTB; fibroid shrinkage, bleeding improvement).   -Consider uterine artery embolization as a non-permanent option to manage fibroids and bleeding.   -Continue Myfembree to halt fibroid growth and help with bleeding -Refer to radiologist for consultation regarding uterine artery embolization likely upon her return - she will send me a Mychart message if she would to proceed in this direction.    -If uterine artery embolization is performed, no specific waiting period before attempting to conceive.  - Risks of hysterectomy in more detail include but are not limited to:  Bleeding - Can bleed enough to need transfusion or need for additional surgeries I.e. conversation to open surgery.  Infection - The vagina can develop cuff infection Injury to surrounding organs/tissues (i.e. bowel/bladder/ureters) - discussed how each of these is managed I.e. bladder injury requires catheter for 10-14 days after surgery Need for additional procedures - Would be specific to a complication or injury Wound complications - No specific wound unless converted to open surgery or laparoscopic Hospital re-admission - In the event of a delayed complication being recognized I.e. ureteral injury discussed Conversion to open surgery - reviewed some complications may necessitate or it may be the only way to complete the surgery.   VTE - Discussed risk of blood clots following delivery - We discussed postop restrictions, precautions and expectations - Preop testing needed:  EMB if she decides to proceed with hyst.  - All questions answered   Routine preventative health maintenance measures emphasized. Please refer to After Visit Summary for other counseling  recommendations.   Return if symptoms worsen or fail to improve.  Milas Hock, MD, FACOG Obstetrician & Gynecologist, [facility] for [facility], [facility]

## 2023-05-24 ENCOUNTER — Ambulatory Visit: Payer: BC Managed Care – PPO | Admitting: Obstetrics and Gynecology

## 2023-05-24 ENCOUNTER — Encounter: Payer: Self-pay | Admitting: Obstetrics and Gynecology

## 2023-05-24 VITALS — BP 134/88 | HR 96 | Ht 65.0 in | Wt 225.0 lb

## 2023-05-24 DIAGNOSIS — D259 Leiomyoma of uterus, unspecified: Secondary | ICD-10-CM

## 2023-05-24 DIAGNOSIS — N920 Excessive and frequent menstruation with regular cycle: Secondary | ICD-10-CM | POA: Diagnosis not present

## 2023-05-29 DIAGNOSIS — E669 Obesity, unspecified: Secondary | ICD-10-CM | POA: Diagnosis not present

## 2023-05-29 DIAGNOSIS — N959 Unspecified menopausal and perimenopausal disorder: Secondary | ICD-10-CM | POA: Diagnosis not present

## 2023-05-29 DIAGNOSIS — E039 Hypothyroidism, unspecified: Secondary | ICD-10-CM | POA: Diagnosis not present

## 2023-05-29 DIAGNOSIS — R7303 Prediabetes: Secondary | ICD-10-CM | POA: Diagnosis not present

## 2023-06-07 ENCOUNTER — Ambulatory Visit: Payer: BC Managed Care – PPO | Admitting: Family Medicine

## 2023-06-08 ENCOUNTER — Encounter: Payer: Self-pay | Admitting: Family Medicine

## 2023-06-08 ENCOUNTER — Ambulatory Visit: Payer: BC Managed Care – PPO | Admitting: Family Medicine

## 2023-06-08 VITALS — BP 129/89 | HR 94 | Ht 65.0 in | Wt 229.0 lb

## 2023-06-08 DIAGNOSIS — R7301 Impaired fasting glucose: Secondary | ICD-10-CM | POA: Diagnosis not present

## 2023-06-08 DIAGNOSIS — Z7689 Persons encountering health services in other specified circumstances: Secondary | ICD-10-CM | POA: Diagnosis not present

## 2023-06-08 DIAGNOSIS — I10 Essential (primary) hypertension: Secondary | ICD-10-CM

## 2023-06-08 DIAGNOSIS — D259 Leiomyoma of uterus, unspecified: Secondary | ICD-10-CM | POA: Diagnosis not present

## 2023-06-08 LAB — POCT GLYCOSYLATED HEMOGLOBIN (HGB A1C): Hemoglobin A1C: 5.5 % (ref 4.0–5.6)

## 2023-06-08 MED ORDER — PHENTERMINE HCL 15 MG PO CAPS: 15.0000 mg | ORAL_CAPSULE | ORAL | 0 refills | Status: DC

## 2023-06-08 NOTE — Assessment & Plan Note (Signed)
Pressure looks great today.  Continue current regimen.  We did discuss that if she is pregnant she is not can member take the losartan HCTZ.  Right now getting pregnant is on hold.  They are going to work on trying to treat embolize potentially some fibroids first and then she may try again.  Just encouraged her to be in touch with her OB/GYN about it I am happy to make and adjust her medications as needed.

## 2023-06-08 NOTE — Assessment & Plan Note (Addendum)
Visit #: 1 Starting Weight: 229 lbs   Current weight: 229 lbs  Previous weight: Change in weight: Goal weight: Dietary goals: To work on portion control increasing vegetable intake. Exercise goals: Currently going to the gym 2 to 3 days/week.  Encouraged doing a combination of cardio and resistance training. Medication: Discussed options I think something like phentermine could be a good option because we could stop it quickly if she has a procedure or attempts at pregnancy.  I do think it would improve her fertility if we were able to reduce her weight.  Start with phentermine.  Discussed potential side effects.  Call if any problems or concerns Follow-up and referrals: 8 weeks.

## 2023-06-08 NOTE — Progress Notes (Signed)
Established Patient Office Visit  Subjective  Patient ID: [name], female    DOB: 16-Dec-1980  Age: 43 y.o. MRN: [id number]  Chief Complaint  Patient presents with   Hypertension   ifg    HPI  She is here for routine follow-up but did want to discuss weight management.  She is feels well physically but she just really unhappy with her weight she has been on cycles of Clomid and has gained a lot of weight in the last couple years she said she weighs more now than she did with either of her pregnancies.  More recently she has been going to the gym 2 to 3 days a week she is really been monitoring and making adjustments to her dietary intake.  She even took berberine for a little while to see if it would help curb her appetite but felt like it really was not helpful.    ROS    Objective:     BP 129/89   Pulse 94   Ht 5\' 5"  (1.651 m)   Wt 229 lb (103.9 kg)   LMP 04/30/2023   SpO2 100%   BMI 38.11 kg/m    Physical Exam Vitals and nursing note reviewed.  Constitutional:      Appearance: Normal appearance.  HENT:     Head: Normocephalic and atraumatic.  Eyes:     Conjunctiva/sclera: Conjunctivae normal.  Cardiovascular:     Rate and Rhythm: Normal rate and regular rhythm.  Pulmonary:     Effort: Pulmonary effort is normal.     Breath sounds: Normal breath sounds.  Skin:    General: Skin is warm and dry.  Neurological:     Mental Status: She is alert.  Psychiatric:        Mood and Affect: Mood normal.      Results for orders placed or performed in visit on 06/08/23  POCT HgB A1C  Result Value Ref Range   Hemoglobin A1C 5.5 4.0 - 5.6 %   HbA1c POC (<> result, manual entry)     HbA1c, POC (prediabetic range)     HbA1c, POC (controlled diabetic range)        The 10-year ASCVD risk score (Arnett DK, et al., 2019) is: 1.4%    Assessment & Plan:   Problem List Items Addressed This Visit       Cardiovascular and Mediastinum   HYPERTENSION, BENIGN    Pressure looks great today.  Continue current regimen.  We did discuss that if she is pregnant she is not can member take the losartan HCTZ.  Right now getting pregnant is on hold.  They are going to work on trying to treat embolize potentially some fibroids first and then she may try again.  Just encouraged her to be in touch with her OB/GYN about it I am happy to make and adjust her medications as needed.        Endocrine   IFG (impaired fasting glucose) - Primary   A1c looks great today at 5.5 continue to monitor carefully.  Continue to work on healthy diet and regular exercise.      Relevant Orders   POCT HgB A1C (Completed)     Genitourinary   Uterine fibroid   Is considering embolization treatment.        Other   Encounter for weight management   Visit #: 1 Starting Weight: 229 lbs   Current weight: 229 lbs  Previous weight: Change in weight: Goal weight: Dietary  goals: To work on portion control increasing vegetable intake. Exercise goals: Currently going to the gym 2 to 3 days/week.  Encouraged doing a combination of cardio and resistance training. Medication: Discussed options I think something like phentermine could be a good option because we could stop it quickly if she has a procedure or attempts at pregnancy.  I do think it would improve her fertility if we were able to reduce her weight.  Start with phentermine.  Discussed potential side effects.  Call if any problems or concerns Follow-up and referrals: 8 weeks.        Return in about 2 months (around 08/06/2023) for New start medication, Wt mgt .    Nani Gasser, MD

## 2023-06-08 NOTE — Assessment & Plan Note (Signed)
A1c looks great today at 5.5 continue to monitor carefully.  Continue to work on healthy diet and regular exercise.

## 2023-06-08 NOTE — Assessment & Plan Note (Signed)
Is considering embolization treatment.

## 2023-07-14 ENCOUNTER — Other Ambulatory Visit: Payer: Self-pay | Admitting: Family Medicine

## 2023-07-16 ENCOUNTER — Encounter: Payer: Self-pay | Admitting: Obstetrics and Gynecology

## 2023-07-26 ENCOUNTER — Other Ambulatory Visit: Payer: Self-pay | Admitting: Obstetrics and Gynecology

## 2023-07-26 DIAGNOSIS — D259 Leiomyoma of uterus, unspecified: Secondary | ICD-10-CM

## 2023-07-26 DIAGNOSIS — N920 Excessive and frequent menstruation with regular cycle: Secondary | ICD-10-CM

## 2023-07-26 NOTE — Progress Notes (Signed)
 Patient requested ref to IR for [place].   Referral placed. She had MRI in December 2024.   Milas Hock, MD Attending Obstetrician & Gynecologist, [facility] for [facility], [facility]

## 2023-08-01 ENCOUNTER — Ambulatory Visit: Admitting: Obstetrics and Gynecology

## 2023-08-01 ENCOUNTER — Other Ambulatory Visit: Payer: Self-pay | Admitting: Obstetrics and Gynecology

## 2023-08-01 DIAGNOSIS — D259 Leiomyoma of uterus, unspecified: Secondary | ICD-10-CM

## 2023-08-10 ENCOUNTER — Ambulatory Visit: Payer: BC Managed Care – PPO | Admitting: Family Medicine

## 2023-08-19 NOTE — Progress Notes (Signed)
 Chief Complaint: Patient was seen in consultation today for symptomatic uterine fibroids   Referring Physician(s): Duncan,Paula  History of Present Illness: [name] is a 43 y.o. female with a medical history significant for HTN, IBS, ovarian cysts and uterine fibroids with menorrhagia. She has had multiple myomectomies - 24 fibroids were removed in 2024. She has also had a hysteroscopic resection of fibroids in 2024.   She is currently being followed by [facility] for pregnancy assistance. At the end of 2024 she was taking clomid with plans for IUI. She established care locally with Dr. Vallarie Gauze in the event she became pregnant. She was ultimately advised by her [facility] team to consider other options such as donor eggs or adoption. They also suggested a hysterectomy for her menorrhagia.   She met with Dr. Vallarie Gauze May 24, 2023 to discuss possible hysterectomy. The patient expressed she still has a strong desire to conceive and would like to consider non-permanent, non-surgical options. She has been taking Myfembree in an attempt to shrink the fibroids and stop the bleeding. Dr. Vallarie Gauze discussed hysterectomy versus uterine artery embolization at length. She discussed the risks and benefits of each treatment option.  The patient expressed in interest in pursuing [place] and she presents today for further discussion.   She experiences heavy bleeding with prolonged periods.  She has no significant constipation or urinary tract bulk symptoms.  She remains interested in becoming pregnant.  Past Medical History:  Diagnosis Date   Abnormal Pap smear 04/24/2002   colposcopy   Anemia    Herpes genitalia 04/24/2006   Last outbreak Nov 2012   History of blood transfusion 06/23/2021   2 units blood given without problems   Hypertension    2009   IBS (irritable bowel syndrome)    Menorrhagia    Pre-eclampsia    2011   Wears glasses or glasses     Past  Surgical History:  Procedure Laterality Date   CESAREAN SECTION  2011   CESAREAN SECTION  10/16/2011   Procedure: CESAREAN SECTION;  Surgeon: Verlyn Goad, MD;  Location: [facility];  Service: Gynecology;  Laterality: N/A;   COLPOSCOPY  2004   DILATATION & CURETTAGE/HYSTEROSCOPY WITH MYOSURE N/A 07/01/2021   Procedure: DILATATION & CURETTAGE/HYSTEROSCOPY WITH MYOSURE;  Surgeon: Ivery Marking, MD;  Location: [facility];  Service: Gynecology;  Laterality: N/A;   DILATION AND CURETTAGE OF UTERUS     x2   DILATION AND CURETTAGE OF UTERUS     Miscarriages x 2   HYSTEROSCOPY  2021   LT breast biopsy     yrs ago benign   MYOMECTOMY ABDOMINAL APPROACH     yrs ago    Allergies: Patient has no known allergies.  Medications: Prior to Admission medications   Medication Sig Start Date End Date Taking? Authorizing Provider  amLODipine  (NORVASC ) 10 MG tablet Take 1 tablet by mouth once daily 07/16/23   Metheney, Catherine D, MD  cetirizine (ZYRTEC) 10 MG tablet Take 10 mg by mouth at bedtime as needed for allergies.    [provider]  FIBER PO Take by mouth daily.    [provider]  fluticasone  (FLONASE ) 50 MCG/ACT nasal spray Place 2 sprays into both nostrils daily. Patient taking differently: Place 2 sprays into both nostrils as needed. 08/20/18   Cydney Draft, MD  Iron -FA-B Cmp-C-Biot-Probiotic (FUSION PLUS) CAPS Take 1 capsule by mouth once daily 03/30/23   Ennever, Peter R, MD  losartan -hydrochlorothiazide  [facility]) 50-12.5  MG tablet Take 1 tablet by mouth daily. 12/22/22   Cydney Draft, MD  MYFEMBREE 40-1-0.5 MG TABS Take 1 tablet by mouth daily.    [provider]  neomycin -polymyxin-dexameth (MAXITROL) 0.1 % OINT Place 1 Application into both eyes at bedtime. 07/14/22   Cydney Draft, MD  OVER THE COUNTER MEDICATION Peppermint caps prn    [provider]  phentermine  15 MG capsule Take 1 capsule (15 mg total) by  mouth every morning. 06/08/23   Cydney Draft, MD  valACYclovir  (VALTREX ) 500 MG tablet Take 1 tablet by mouth once daily 03/05/23   Cydney Draft, MD  VITAMIN D  PO Take by mouth daily.    [provider]     Family History  Problem Relation Age of Onset   Alcohol abuse Father    Hypertension Mother    Breast cancer Maternal Aunt        2 maternal aunt   Breast cancer Paternal Aunt    Hypertension Maternal Aunt    Heart attack Paternal Uncle        2 uncles   Stroke Maternal Grandmother        TIA's   Colon cancer Paternal Grandmother    Stomach cancer Paternal Grandmother     Social History   Socioeconomic History   Marital status: Single    Spouse name: Not on file   Number of children: Not on file   Years of education: Not on file   Highest education level: Not on file  Occupational History   Occupation: speech therapist  Tobacco Use   Smoking status: Never   Smokeless tobacco: Never  Vaping Use   Vaping status: Never Used  Substance and Sexual Activity   Alcohol use: Not Currently   Drug use: No   Sexual activity: Yes    Partners: Male    Birth control/protection: None  Other Topics Concern   Not on file  Social History Narrative   Not on file   Social Drivers of Health   Financial Resource Strain: Not on file  Food Insecurity: Not on file  Transportation Needs: Not on file  Physical Activity: Not on file  Stress: Not on file  Social Connections: Unknown (08/23/2021)   Received from [facility]   Social Network    Social Network: Not on file    Review of Systems: A 12 point ROS discussed and pertinent positives are indicated in the HPI above.  All other systems are negative.  Vital Signs: There were no vitals taken for this visit.   Physical Exam Constitutional:      General: She is not in acute distress. HENT:     Head: Normocephalic.     Mouth/Throat:     Mouth: Mucous membranes are moist.  Eyes:      General: No scleral icterus. Cardiovascular:     Rate and Rhythm: Normal rate and regular rhythm.  Pulmonary:     Effort: No respiratory distress.  Abdominal:     General: There is no distension.  Skin:    General: Skin is warm and dry.  Neurological:     Mental Status: She is alert and oriented to person, place, and time.     Imaging: MRI Pelvis 04/16/23   Labs:  CBC: Recent Labs    12/20/22 0817  WBC 3.3*  HGB 13.3  HCT 40.4  PLT 406    COAGS: No results for input(s): "INR", "APTT" in the last  8760 hours.  BMP: No results for input(s): "NA", "K", "CL", "CO2", "GLUCOSE", "BUN", "CALCIUM", "CREATININE", "GFRNONAA", "GFRAA" in the last 8760 hours.  Invalid input(s): "CMP"  LIVER FUNCTION TESTS: No results for input(s): "BILITOT", "AST", "ALT", "ALKPHOS", "PROT", "ALBUMIN" in the last 8760 hours.  TUMOR MARKERS: No results for input(s): "AFPTM", "CEA", "CA199", "CHROMGRNA" in the last 8760 hours.  Assessment: [name] is a 43 y.o. female presenting to IR clinic for discussion of symptomatic uterine fibroids.  Her primary symptoms are menorrhagia, and she has previously tried multiple prior myomectomies, endometrial ablation, and pharmacological measures.  The patient was counseled on options for treatment of uterine fibroids with their accompanying symptoms of heavy menses, painful periods, and/or bulk symptoms including doing nothing, hormonal therapy, myomectomy, hysterectomy, and uterine artery embolization.  We discussed uterine artery embolization ([place]) as a potential therapy.  We described the procedure itself, including the possibility of using analgesia for pain control, a bladder catheter, and admission to the hospital for a 23 hour inpatient observation period.  The procedure is done under conscious sedation.  We quoted an efficacy of 90% for improvement of menorrhagia and of 75% for improvement of bulk symptoms at one year related to fibroids.  We  informed the patient that approximately 80% of those patients described sustained benefits from the procedure at 5 years.  We discussed secondary hysterectomy rates for persistent symptoms of 3%, 4%, 10%, and 17% at 1, 2, 3, and 5 years, respectively.  IF the patient also had adenomyosis as a potential contributing cause for her symptoms, we quoted a higher rate of recurrent symptoms after [place], being 30-50% at 3-5 years.    We also quoted a 1 in 250-500 (0.2-0.4%) incidence of requiring emergent or semi-emergent hysterectomy due a procedural complication, as well an as approximately 7% chance of early onset menopause, mostly in women >57 years of age, with a less than 1% risk for women less than 38 years of age.  We discussed the need to evaluate the ovarian arteries and in some cases the need to treat the fibroids via the ovarian arteries which can lead to a higher risk of early menopause.  We discussed that there have been numerous successful full-term pregnancies after [place], with a reported pregnancy rate of 40% in women desiring pregnancy, with a slightly higher risk for abnormal placentation after [place].  However, we also explained that abnormal placentation can also be seen after a D&C procedure, C-section, and hysteroscopic myomectomy.  We also discussed the risk of an occult malignancy in patients with symptomatic fibroids being anywhere from 1 in 350 to 1 in 8,000 (0.0125-0.3%), depending upon the patient's age, symptom complex, family risk factors, and appearance of the fibroid on her MRI study.  We also briefly discussed surgical options of treatment offered by our Gynecology colleagues with hysterectomy, myomectomy and/or endometrial ablation.    After her visit today she is most interested in continuing to consider her options and determine if she wishes to pursue [place] given decreased rates of conception status post [place].  She will reach back out to our office if she decides to proceed.   Creasie Doctor, MD Pager: [phone]    I spent a total of  40 Minutes   in face to face in clinical consultation, greater than 50% of which was counseling/coordinating care for symptomatic uterine fibroids.

## 2023-08-20 ENCOUNTER — Ambulatory Visit
Admission: RE | Admit: 2023-08-20 | Discharge: 2023-08-20 | Disposition: A | Discharge status: Home / Self Care | Source: Ambulatory Visit | Attending: Obstetrics and Gynecology

## 2023-08-20 VITALS — BP 150/98 | HR 94 | Temp 98.1°F | Resp 16

## 2023-08-20 DIAGNOSIS — D259 Leiomyoma of uterus, unspecified: Secondary | ICD-10-CM | POA: Diagnosis not present

## 2023-08-20 HISTORY — PX: IR RADIOLOGIST EVAL & MGMT: IMG5224

## 2023-10-03 ENCOUNTER — Ambulatory Visit: Admitting: Family Medicine

## 2023-10-05 ENCOUNTER — Encounter: Admitting: Sports Medicine

## 2023-10-12 ENCOUNTER — Encounter: Admitting: Sports Medicine

## 2023-10-28 ENCOUNTER — Other Ambulatory Visit: Payer: Self-pay | Admitting: Family Medicine

## 2023-11-20 ENCOUNTER — Encounter: Payer: Self-pay | Admitting: Family Medicine

## 2023-12-25 ENCOUNTER — Encounter: Payer: Self-pay | Admitting: Sports Medicine

## 2024-01-02 DIAGNOSIS — Z713 Dietary counseling and surveillance: Secondary | ICD-10-CM | POA: Diagnosis not present

## 2024-01-02 DIAGNOSIS — E669 Obesity, unspecified: Secondary | ICD-10-CM | POA: Diagnosis not present

## 2024-01-18 ENCOUNTER — Encounter: Payer: Self-pay | Admitting: Family Medicine

## 2024-01-22 NOTE — Telephone Encounter (Signed)
 Let pt know overall labs look great. Send to HIM to be abastracted.

## 2024-01-23 NOTE — Telephone Encounter (Signed)
 Patient informed.

## 2024-01-24 ENCOUNTER — Other Ambulatory Visit: Payer: Self-pay | Admitting: Family Medicine

## 2024-01-24 NOTE — Telephone Encounter (Signed)
 Appointment required for refills. Please call pt and advise. thanks

## 2024-01-30 DIAGNOSIS — E669 Obesity, unspecified: Secondary | ICD-10-CM | POA: Diagnosis not present

## 2024-02-11 DIAGNOSIS — N3000 Acute cystitis without hematuria: Secondary | ICD-10-CM | POA: Diagnosis not present

## 2024-02-19 ENCOUNTER — Ambulatory Visit (INDEPENDENT_AMBULATORY_CARE_PROVIDER_SITE_OTHER): Admitting: Family Medicine

## 2024-02-19 ENCOUNTER — Ambulatory Visit: Payer: Self-pay | Admitting: Family Medicine

## 2024-02-19 VITALS — BP 138/87 | HR 92 | Ht 65.0 in | Wt 230.9 lb

## 2024-02-19 DIAGNOSIS — R0602 Shortness of breath: Secondary | ICD-10-CM

## 2024-02-19 DIAGNOSIS — R7301 Impaired fasting glucose: Secondary | ICD-10-CM

## 2024-02-19 DIAGNOSIS — R252 Cramp and spasm: Secondary | ICD-10-CM | POA: Diagnosis not present

## 2024-02-19 DIAGNOSIS — I10 Essential (primary) hypertension: Secondary | ICD-10-CM | POA: Diagnosis not present

## 2024-02-19 DIAGNOSIS — R7989 Other specified abnormal findings of blood chemistry: Secondary | ICD-10-CM

## 2024-02-19 LAB — D-DIMER, QUANTITATIVE: D-DIMER: 0.78 mg{FEU}/L — ABNORMAL HIGH (ref 0.00–0.49)

## 2024-02-19 NOTE — Patient Instructions (Signed)
 If labs are normal we can order a chest xray.

## 2024-02-19 NOTE — Progress Notes (Signed)
 Hi Brelyn, the D-dimer came back slightly elevated.  So I do think we need to work this up with further imaging, generally try to get you scheduled for a CT with contrast first thing tomorrow if overnight you develop any chest pain or increased shortness of breath or chest discomfort then please go to the emergency room.

## 2024-02-19 NOTE — Progress Notes (Signed)
 Established Patient Office Visit  Patient ID: [name], female    DOB: [birth year]  Age: 43 y.o. MRN: [id number] PCP: Alvan Tracie BIRCH, MD  Chief Complaint  Patient presents with   Hypertension   Shortness of Breath   ifg    Subjective:     HPI  Discussed the use of AI scribe software for clinical note transcription with the patient, who gave verbal consent to proceed.  History of Present Illness [name] is a 43 year old female with hypertension and prediabetes who presents with shortness of breath and leg cramps.  Dyspnea and fatigue - Shortness of breath present for the past couple of weeks - Dyspnea most noticeable when climbing stairs to her room, resulting in extreme fatigue upon reaching the top - Heavy breathing after short exertions, such as running to her car, confirmed by listening to a recording - Similar symptoms previously occurred during episodes of anemia, for which she was taking Myfembree until June, now periods are heavy   Lower extremity muscle cramping - Increased leg cramping, particularly in the calves, occurring almost nightly - Cramping has worsened from a couple of times per week to almost daily - Significant discomfort at night due to cramps - Currently taking methotrexate and magnesium glycinate; uncertain if magnesium glycinate is effective for cramps  Physical activity and hydration - Recent increase in physical activity, participating in group exercise classes - Suboptimal hydration, averaging 50 to 60 ounces of fluid intake per day  Hypertension management - Hypertension managed with losartan , taken once or twice a week - Losartan  dosing increased after experiencing swelling - Home blood pressure readings similar to those obtained during the visit     ROS    Objective:     BP 138/87   Pulse 92   Ht 5' 5 (1.651 m)   Wt 230 lb 14.4 oz (104.7 kg)   SpO2 100%   BMI 38.42 kg/m    Physical Exam Vitals and  nursing note reviewed.  Constitutional:      Appearance: Normal appearance.  HENT:     Head: Normocephalic and atraumatic.  Eyes:     Conjunctiva/sclera: Conjunctivae normal.  Cardiovascular:     Rate and Rhythm: Normal rate and regular rhythm.  Pulmonary:     Effort: Pulmonary effort is normal.     Breath sounds: Normal breath sounds.  Skin:    General: Skin is warm and dry.  Neurological:     Mental Status: She is alert.  Psychiatric:        Mood and Affect: Mood normal.      No results found for any visits on 02/19/24.    The 10-year ASCVD risk score (Arnett DK, et al., 2019) is: 2.2%    Assessment & Plan:   Problem List Items Addressed This Visit       Cardiovascular and Mediastinum   HYPERTENSION, BENIGN - Primary   Relevant Orders   CMP14+EGFR   CBC with Differential/Platelet   Iron , TIBC and Ferritin Panel   TSH   Magnesium   D-dimer, quantitative   CK (Creatine Kinase)     Endocrine   IFG (impaired fasting glucose)   Relevant Orders   CMP14+EGFR   CBC with Differential/Platelet   Iron , TIBC and Ferritin Panel   TSH   Magnesium   D-dimer, quantitative   CK (Creatine Kinase)   Hemoglobin A1c   Other Visit Diagnoses       SOB (shortness of breath)  Relevant Orders   EKG 12-Lead   CMP14+EGFR   CBC with Differential/Platelet   Iron , TIBC and Ferritin Panel   TSH   Magnesium   D-dimer, quantitative   CK (Creatine Kinase)     Leg cramping       Relevant Orders   CMP14+EGFR   CBC with Differential/Platelet   Iron , TIBC and Ferritin Panel   TSH   Magnesium   D-dimer, quantitative   CK (Creatine Kinase)       Assessment and Plan Assessment & Plan Essential hypertension Blood pressure slightly elevated, consistent with home readings. Not taking losartan  daily, which may contribute to suboptimal control. No significant swelling since increasing losartan  frequency. - Recheck blood pressure during visit. - Advise to start taking  losartan  daily, pending lab results. - Check potassium levels before starting daily losartan  due to diuretic component.  Impaired fasting glucose (prediabetes) Brief discussion regarding prediabetes in this encounter, focused on scheduling a follow-up appointment, with no specific changes or management plans made.  Shortness of breath Shortness of breath over the past couple of weeks, primarily with exertion. No history of blood clots. Differential includes anemia, given heavy menstrual cycles and previous anemia. EKG normal, ruling out cardiac causes. Considering possible anemia or clot as underlying cause. D-dimer test discussed as a cost-effective method to rule out clots; normal result would exclude clots, while elevated result would necessitate further imaging. - Order blood work including iron , potassium, magnesium, blood count, thyroid , and D-dimer. - Advise to monitor for worsening symptoms and seek emergency care if severe shortness of breath or chest pain occurs.  Leg cramps Increased frequency of leg cramps, particularly in the calves, occurring almost nightly. Currently taking magnesium glycinate without noticeable improvement. Increased physical activity and suboptimal hydration may contribute. - Order blood work to check magnesium levels. - Advise to increase hydration to potentially alleviate cramps.  EKG interp: shows rate of 89 bpm with NSR, no change from prior from 2023  Return in about 6 months (around 08/19/2024) for Hypertension.    Tracie Byars, MD [facility] at [facility]

## 2024-02-19 NOTE — Addendum Note (Signed)
 Addended by: FREYA BASCOM CROME on: 02/19/2024 02:26 PM   Modules accepted: Orders

## 2024-02-20 ENCOUNTER — Ambulatory Visit

## 2024-02-20 DIAGNOSIS — R7989 Other specified abnormal findings of blood chemistry: Secondary | ICD-10-CM

## 2024-02-20 DIAGNOSIS — R0602 Shortness of breath: Secondary | ICD-10-CM

## 2024-02-20 DIAGNOSIS — R918 Other nonspecific abnormal finding of lung field: Secondary | ICD-10-CM | POA: Diagnosis not present

## 2024-02-20 LAB — CMP14+EGFR
ALT: 12 IU/L (ref 0–32)
AST: 23 IU/L (ref 0–40)
Albumin: 4 g/dL (ref 3.9–4.9)
Alkaline Phosphatase: 102 IU/L (ref 41–116)
BUN/Creatinine Ratio: 14 (ref 9–23)
BUN: 11 mg/dL (ref 6–24)
Bilirubin Total: 0.2 mg/dL (ref 0.0–1.2)
CO2: 20 mmol/L (ref 20–29)
Calcium: 9.3 mg/dL (ref 8.7–10.2)
Chloride: 102 mmol/L (ref 96–106)
Creatinine, Ser: 0.8 mg/dL (ref 0.57–1.00)
Globulin, Total: 3.9 g/dL (ref 1.5–4.5)
Glucose: 83 mg/dL (ref 70–99)
Potassium: 4.7 mmol/L (ref 3.5–5.2)
Sodium: 137 mmol/L (ref 134–144)
Total Protein: 7.9 g/dL (ref 6.0–8.5)
eGFR: 93 mL/min/1.73 (ref >59)

## 2024-02-20 LAB — IRON,TIBC AND FERRITIN PANEL
Ferritin: 27 ng/mL (ref 15–150)
Iron Saturation: 11 % — ABNORMAL LOW (ref 15–55)
Iron: 49 ug/dL (ref 27–159)
Total Iron Binding Capacity: 451 ug/dL — ABNORMAL HIGH (ref 250–450)
UIBC: 402 ug/dL (ref 131–425)

## 2024-02-20 LAB — CBC WITH DIFFERENTIAL/PLATELET
Basophils Absolute: 0 x10E3/uL (ref 0.0–0.2)
Basos: 1 %
EOS (ABSOLUTE): 0.2 x10E3/uL (ref 0.0–0.4)
Eos: 7 %
Hematocrit: 36.4 % (ref 34.0–46.6)
Hemoglobin: 11.4 g/dL (ref 11.1–15.9)
Immature Grans (Abs): 0 x10E3/uL (ref 0.0–0.1)
Immature Granulocytes: 0 %
Lymphocytes Absolute: 1.5 x10E3/uL (ref 0.7–3.1)
Lymphs: 42 %
MCH: 28.8 pg (ref 26.6–33.0)
MCHC: 31.3 g/dL — ABNORMAL LOW (ref 31.5–35.7)
MCV: 92 fL (ref 79–97)
Monocytes Absolute: 0.5 x10E3/uL (ref 0.1–0.9)
Monocytes: 15 %
Neutrophils Absolute: 1.2 x10E3/uL — ABNORMAL LOW (ref 1.4–7.0)
Neutrophils: 35 %
Platelets: 375 x10E3/uL (ref 150–450)
RBC: 3.96 x10E6/uL (ref 3.77–5.28)
RDW: 12.7 % (ref 11.7–15.4)
WBC: 3.5 x10E3/uL (ref 3.4–10.8)

## 2024-02-20 LAB — CK: Total CK: 271 U/L — ABNORMAL HIGH (ref 32–182)

## 2024-02-20 LAB — TSH: TSH: 0.934 u[IU]/mL (ref 0.450–4.500)

## 2024-02-20 LAB — HEMOGLOBIN A1C
Est. average glucose Bld gHb Est-mCnc: 103 mg/dL
Hgb A1c MFr Bld: 5.2 % (ref 4.8–5.6)

## 2024-02-20 LAB — MAGNESIUM: Magnesium: 2.1 mg/dL (ref 1.6–2.3)

## 2024-02-20 MED ORDER — IOHEXOL 350 MG/ML SOLN
100.0000 mL | Freq: Once | INTRAVENOUS | Status: AC | PRN
  Administered 2024-02-20: 100 mL via INTRAVENOUS

## 2024-02-21 ENCOUNTER — Ambulatory Visit: Payer: Self-pay | Admitting: Family Medicine

## 2024-02-21 ENCOUNTER — Other Ambulatory Visit: Payer: Self-pay | Admitting: Family Medicine

## 2024-02-21 DIAGNOSIS — R0602 Shortness of breath: Secondary | ICD-10-CM

## 2024-02-21 DIAGNOSIS — R748 Abnormal levels of other serum enzymes: Secondary | ICD-10-CM

## 2024-02-21 DIAGNOSIS — R918 Other nonspecific abnormal finding of lung field: Secondary | ICD-10-CM

## 2024-02-21 DIAGNOSIS — R9389 Abnormal findings on diagnostic imaging of other specified body structures: Secondary | ICD-10-CM

## 2024-02-21 NOTE — Progress Notes (Signed)
 [name], iron  levels are fairly stable from a year ago just down slightly.  Muscle enzyme is also a little bit elevated indicating a little bit of muscle breakdown happening.  This may be related to the leg cramping.  Make sure you are hydrating well.  Your metabolic panel including liver and kidney function looks great.  Thyroid  looks great.  Magnesium is normal and adequate.  A1c looks great in the normal range.

## 2024-02-21 NOTE — Progress Notes (Signed)
 Hi Sofiya the great news is that you do not have a blood clot which is what I was worried about.  But we have uncovered a different mystery.  They noted with a call they call bilateral ground glass reticular opacities so this can come with viral pneumonia,  for example.  If that is the case then it should resolve on its own.  Though you have not really had a fever or cough which is reassuring.  It can also show up when there is a little extra fluid backing up in the lungs but you did not seem to have any volume overload on your exam when you were here the other day so I think that is less likely.  It can also come along with other autoimmune type processes such as polymyositis.  Do you think you be able to come by later this week or early next week for some additional blood work?  Have you had any muscle weakness I know you mention the cramping?  I also like to get you scheduled for pulmonary function tests through our pulmonology department if that is okay.  Just let me know.

## 2024-02-21 NOTE — Telephone Encounter (Signed)
 Pended pulmonary function test order

## 2024-02-22 NOTE — Telephone Encounter (Signed)
 Orders Placed This Encounter  Procedures   Pulmonary function test    Standing Status:   Future    Expiration Date:   02/21/2025    Where should this test be performed?:   Outpatient Pulmonary    What type of PFT is being ordered?:   Full PFT

## 2024-02-27 ENCOUNTER — Ambulatory Visit

## 2024-02-27 ENCOUNTER — Telehealth: Payer: Self-pay

## 2024-02-27 ENCOUNTER — Other Ambulatory Visit: Payer: Self-pay

## 2024-02-27 DIAGNOSIS — R9389 Abnormal findings on diagnostic imaging of other specified body structures: Secondary | ICD-10-CM

## 2024-02-27 DIAGNOSIS — R0602 Shortness of breath: Secondary | ICD-10-CM

## 2024-02-27 NOTE — Telephone Encounter (Signed)
 Spoke with patient . Informed that  [facility] [place]  is going to reach out to her for Scheduling the PFT - states that she could likely get her scheduled for this tomorrow.  I also suggested that the patient check with her insurance co. To see if this is covered under her plan. The representative at [facility] states the max out of pocket would be $162  if insurance did not cover the testing. Patient informed of this as well.

## 2024-02-27 NOTE — Telephone Encounter (Signed)
 Spoke with [facility]. Phone # [phone].Was told that they could do a full PFT without the referral needed (unless insurance requires this- she states that some insurances do) - forwarded her the order for the PFT  at Fax# [phone]

## 2024-02-27 NOTE — Telephone Encounter (Signed)
 Spoke with patient.  She is frustrated regarding the scheduling issues of spirometry being scheduled when PFT being ordered . States she had taken off of work for the testing to be done today.   She is frustrated over communication problems. States she was not informed that she could stop by the office to have the lab work drawn at her convenience.   I have called the hospital PFT  phone # [phone] but was sent to an answering machine .   Spoke with Jackelin Matos who states that the PFT would first have to be ordered as Pulmonology referral  Called [facility] and was told that they only do PFT for their established pateints.   Received a return call from hospital at 10:00am and was told that they could do the PFT but they did not do prior authorizations and that [facility] pulmonology had changed their process of how this is done and would need to be an established patient with them to have the procedure done.   Order was placed for pulmonology by Parthenia Nap  and Jackelin Matos did receive this and is in process of working on this for the patient at the time of writing this message.

## 2024-02-29 DIAGNOSIS — R0602 Shortness of breath: Secondary | ICD-10-CM | POA: Diagnosis not present

## 2024-02-29 DIAGNOSIS — R918 Other nonspecific abnormal finding of lung field: Secondary | ICD-10-CM | POA: Diagnosis not present

## 2024-02-29 DIAGNOSIS — R748 Abnormal levels of other serum enzymes: Secondary | ICD-10-CM | POA: Diagnosis not present

## 2024-03-03 ENCOUNTER — Encounter: Payer: Self-pay | Admitting: Family Medicine

## 2024-03-03 DIAGNOSIS — R0602 Shortness of breath: Secondary | ICD-10-CM

## 2024-03-03 DIAGNOSIS — R9389 Abnormal findings on diagnostic imaging of other specified body structures: Secondary | ICD-10-CM

## 2024-03-04 DIAGNOSIS — Z713 Dietary counseling and surveillance: Secondary | ICD-10-CM | POA: Diagnosis not present

## 2024-03-10 ENCOUNTER — Ambulatory Visit: Payer: Self-pay | Admitting: Family Medicine

## 2024-03-10 NOTE — Progress Notes (Signed)
 ANA is negative that is a test for lupus.

## 2024-03-11 ENCOUNTER — Telehealth: Admitting: Family Medicine

## 2024-03-11 DIAGNOSIS — J069 Acute upper respiratory infection, unspecified: Secondary | ICD-10-CM | POA: Diagnosis not present

## 2024-03-11 MED ORDER — AZITHROMYCIN 250 MG PO TABS: ORAL_TABLET | ORAL | 0 refills | Status: AC

## 2024-03-11 NOTE — Progress Notes (Signed)
 We are sorry you are not feeling well.  Here is how we plan to help!  Based on what you have shared with me, it looks like you may have a viral upper respiratory infection.  Upper respiratory infections are caused by a large number of viruses; however, rhinovirus is the most common cause.   Symptoms vary from person to person, with common symptoms including sore throat, cough, and fatigue or lack of energy, and a feeling of general discomfort.  A low-grade fever of up to 100.4 may present, but is often uncommon.  Symptoms vary however, and are closely related to a person's age or underlying illnesses.  The most common symptoms associated with an upper respiratory infection are nasal discharge or congestion, cough, sneezing, headache and pressure in the ears and face.  These symptoms usually persist for about 3 to 10 days, but can last up to 2 weeks.  It is important to know that upper respiratory infections do not cause serious illness or complications in most cases.    Upper respiratory infections can be transmitted from person to person, with the most common method of transmission being a person's hands.  The virus is able to live on the skin and can infect other persons for up to 2 hours after direct contact.  Also, these can be transmitted when someone coughs or sneezes; thus, it is important to cover the mouth to reduce this risk.  To keep the spread of the illness at bay, good hand hygiene is very important!  Because this is a viral infection, there are no specific treatments other than to help you with the symptoms until the infection runs its course.    For nasal congestion, you may use an oral decongestants such as Mucinex  D or if you have glaucoma or high blood pressure use plain Mucinex .  Saline nasal spray or nasal drops can help and can safely be used as often as needed for congestion.    If you do not have a history of heart disease, hypertension, diabetes or thyroid  disease,  prostate/bladder issues or glaucoma, you may also use Sudafed to treat nasal congestion.  It is highly recommended that you consult with a pharmacist or your primary care physician to ensure this medication is safe for you to take.     If you have a cough, you may use over-the-counter cough suppressants such as Delsym and Robitussin.  If you have glaucoma or high blood pressure, you can also use Coricidin HBP.     If you have a sore or scratchy throat, use a saltwater gargle-  to  teaspoon of salt dissolved in a 4-ounce to 8-ounce glass of warm water .  Gargle the solution for approximately 15-30 seconds and then spit.  It is important not to swallow the solution.  You can also use throat lozenges/cough drops and Chloraseptic spray to help with throat pain or discomfort.  Warm or cold liquids can also be helpful in relieving throat pain.  For headache, pain, or general discomfort, you can use Tylenol  as directed.      HOME CARE Only take medications as instructed by your medical team. Be sure to drink plenty of fluids. Water  is fine as well as fruit juices, sodas and electrolyte beverages. You may want to stay away from caffeine or alcohol. If you are nauseated, try taking small sips of liquids. How do you know if you are getting enough fluid? Your urine should be a pale yellow or almost colorless. Get  rest. Taking a steamy shower or using a humidifier may help nasal congestion and ease sore throat pain. You can place a towel over your head and breathe in the steam from hot water  coming from a faucet. Using a saline nasal spray works much the same way. Cough drops, hard candies and sore throat lozenges may ease your cough. Avoid close contacts especially the very young and the elderly Cover your mouth if you cough or sneeze Always remember to wash your hands.   GET HELP RIGHT AWAY IF: You develop worsening fever. If your symptoms do not improve within 10 days You develop yellow or green  discharge from your nose over 3 days. You have coughing fits You develop a severe head ache or visual changes. You develop shortness of breath, difficulty breathing or start having chest pain Your symptoms persist after you have completed your treatment plan  MAKE SURE YOU  Understand these instructions. Will watch your condition. Will get help right away if you are not doing well or get worse.  Your e-visit answers were reviewed by a board certified advanced clinical practitioner to complete your personal care plan. Depending upon the condition, your plan could have included both over-the-counter or prescription medications.  Please review your pharmacy choice. If there is a problem, you may call our nursing hot line at and have the prescription routed to another pharmacy. Your safety is important to us . If you have drug allergies, check your prescription carefully.   You can use MyChart to ask questions about today's visit, request a non-urgent call back, or ask for a work or school excuse for 24 hours related to this e-Visit. If it has been greater than 24 hours you will need to follow up with your provider, or enter a new e-Visit to address those concerns. You will get an e-mail in the next two days asking about your experience.  I hope that your e-visit has been valuable and will speed your recovery. Thank you for using e-visits.   I have spent 5 minutes in review of e-visit questionnaire, review and updating patient chart, medical decision making and response to patient.   Elsie Velma Lunger, PA-C

## 2024-03-11 NOTE — Addendum Note (Signed)
 Addended by: GLADIS FALLOW C on: 03/11/2024 11:06 AM   Modules accepted: Orders

## 2024-03-11 NOTE — Progress Notes (Signed)
 Since you noted trying to get actively pregnant, can you share with me the date of your last menstrual period. Want to make sure any additions are safe for you to take. I do feel this is viral so recommend you follow the recommendations previously given. I am fine sending in antibiotic (Azithromycin ) to have on hand in case of any progressive symptoms despite recommended treatment (it is safe in pregnancy). We cannot prescribe codeine -containing medication but can offer some alternative prescriptions once I have an idea of last menstrual period, concern for current pregnancy.

## 2024-03-19 ENCOUNTER — Ambulatory Visit: Admitting: Internal Medicine

## 2024-03-19 ENCOUNTER — Encounter: Payer: Self-pay | Admitting: Internal Medicine

## 2024-03-19 VITALS — BP 148/84 | HR 102 | Ht 65.0 in | Wt 228.0 lb

## 2024-03-19 DIAGNOSIS — R0609 Other forms of dyspnea: Secondary | ICD-10-CM | POA: Diagnosis not present

## 2024-03-19 DIAGNOSIS — R058 Other specified cough: Secondary | ICD-10-CM | POA: Diagnosis not present

## 2024-03-19 DIAGNOSIS — D500 Iron deficiency anemia secondary to blood loss (chronic): Secondary | ICD-10-CM

## 2024-03-19 NOTE — Patient Instructions (Addendum)
 At onset of cough >  Try prilosec otc 20mg   Take 30-60 min before first meal of the day and Pepcid  ac (famotidine ) 20 mg one @  bedtime until cough is completely gone for at least a week without the need for cough suppression  For drainage / throat tickle try take CHLORPHENIRAMINE  4 mg    may cause drowsiness so start with just a dose or two an hour before bedtime and see how you tolerate it before trying in daytime.   Please remember to go to the lab department   for your tests - we will call you with the results when they are available.   Make sure you check your oxygen saturation at your highest level of activity(NOT after you stop)  to be sure it stays over 90% and keep track of it at least once a week, more often if breathing getting worse, and let me know if losing ground. (Collect the dots to connect the dots approach)     Please schedule a follow up visit in 3 months but call sooner if needed

## 2024-03-19 NOTE — Progress Notes (Unsigned)
 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    History of Present Illness  03/19/2024  Pulmonary/ 1st office eval/[name]  Chief Complaint  Patient presents with   Establish Care  Dyspnea:  one flight of steps at home stops at top seems to be improving  Cough: noct pnds  Sleep: bed is flat one - 2 pillows  SABA use: none  02 ldz:wnwz     No obvious day to day or daytime pattern/variability or assoc excess/ purulent sputum or mucus plugs or hemoptysis or cp or chest tightness, subjective wheeze or overt sinus or hb symptoms.    Also denies any obvious fluctuation of symptoms with weather or environmental changes or other aggravating or alleviating factors except as outlined above   No unusual exposure hx or h/o childhood pna/ asthma or knowledge of premature birth.  Current Allergies, Complete Past Medical History, Past Surgical History, Family History, and Social History were reviewed in [employer] record.  ROS  The following are not active complaints unless bolded Hoarseness, sore throat/globus , dysphagia, dental problems, itching, sneezing,  nasal congestion or discharge of excess mucus or purulent secretions, ear ache,   fever, chills, sweats, unintended wt loss or wt gain, classically pleuritic or exertional cp,  orthopnea pnd or arm/hand swelling  or leg swelling, presyncope, palpitations, abdominal  pain, anorexia, nausea, vomiting, diarrhea  or change in bowel habits or change in bladder habits, change in stools or change in urine, dysuria, hematuria,  rash, arthralgias, visual complaints, headache, numbness, weakness or ataxia or problems with walking or coordination,  change in mood or  memory.             Outpatient Medications Prior to Visit  Medication Sig Dispense Refill   amLODipine  (NORVASC ) 10 MG tablet Take 1 tablet by mouth once daily 30 tablet 0   cetirizine (ZYRTEC) 10 MG tablet Take 10 mg by mouth at bedtime as needed for allergies.     FIBER PO Take by mouth daily.     fluticasone  (FLONASE ) 50 MCG/ACT nasal spray Place 2 sprays into both nostrils daily. (Patient taking differently: Place 2 sprays into both nostrils as needed for allergies.) 16 g 6   Iron -FA-B Cmp-C-Biot-Probiotic (FUSION PLUS) CAPS Take 1 capsule by mouth once daily 30 capsule 0   losartan -hydrochlorothiazide  (HYZAAR) 50-12.5 MG tablet Take 1 tablet by mouth daily. 90 tablet 1   neomycin -polymyxin-dexameth (MAXITROL) 0.1 % OINT Place 1 Application into both eyes at bedtime. 3.5 g 1   OVER THE COUNTER MEDICATION Peppermint caps prn     valACYclovir  (VALTREX ) 500 MG tablet Take 1 tablet by mouth once daily 90 tablet 3   VITAMIN D  PO Take by mouth daily.     No facility-administered medications prior to visit.  Past Medical History:  Diagnosis Date   Abnormal Pap smear 04/24/2002   colposcopy   Anemia    Herpes genitalia 04/24/2006   Last outbreak Nov 2012   History of blood transfusion 06/23/2021   2 units blood given without problems   Hypertension    2009   IBS (irritable bowel syndrome)    Menorrhagia    Pre-eclampsia    2011   Wears glasses or glasses       Objective:     BP (!) 148/84   Pulse (!) 102   Ht 5' 5 (1.651 m)   Wt 228 lb (103.4 kg)   LMP 02/19/2024   SpO2 97%   BMI 37.94 kg/m   SpO2: 97 %RA   pleasant mod obese (by BMI )  amb bf freq throat clearing     HEENT : Oropharynx  clear      Nasal turbinates nl    NECK :  without  apparent JVD/ palpable Nodes/TM    LUNGS: no acc muscle use,  Nl contour chest which is clear to A and P bilaterally without cough on insp or exp maneuvers   CV:  RRR  no s3 or murmur or increase in P2, and no edema   ABD:  soft and nontender   MS:  Gait nl   ext warm without deformities Or obvious joint restrictions  calf tenderness, cyanosis or clubbing    SKIN: warm and dry without lesions    NEURO:  alert, approp, nl sensorium with  no motor or cerebellar deficits apparent.       Assessment

## 2024-03-20 ENCOUNTER — Encounter: Payer: Self-pay | Admitting: Hematology & Oncology

## 2024-03-20 ENCOUNTER — Telehealth: Payer: Self-pay | Admitting: Internal Medicine

## 2024-03-20 ENCOUNTER — Ambulatory Visit: Payer: Self-pay | Admitting: Internal Medicine

## 2024-03-20 LAB — CBC WITH DIFFERENTIAL/PLATELET
Absolute Lymphocytes: 1385 {cells}/uL (ref 850–3900)
Absolute Monocytes: 625 {cells}/uL (ref 200–950)
Basophils Absolute: 20 {cells}/uL (ref 0–200)
Basophils Relative: 0.4 %
Eosinophils Absolute: 200 {cells}/uL (ref 15–500)
Eosinophils Relative: 4 %
HCT: 32.8 % — ABNORMAL LOW (ref 35.9–46.0)
Hemoglobin: 10.6 g/dL — ABNORMAL LOW (ref 11.7–15.5)
MCH: 28.2 pg (ref 27.0–33.0)
MCHC: 32.3 g/dL (ref 31.6–35.4)
MCV: 87.2 fL (ref 81.4–101.7)
MPV: 9.5 fL (ref 7.5–12.5)
Monocytes Relative: 12.5 %
Neutro Abs: 2770 {cells}/uL (ref 1500–7800)
Neutrophils Relative %: 55.4 %
Platelets: 363 Thousand/uL (ref 140–400)
RBC: 3.76 Million/uL — ABNORMAL LOW (ref 3.80–5.10)
RDW: 13.1 % (ref 11.0–15.0)
Total Lymphocyte: 27.7 %
WBC: 5 Thousand/uL (ref 3.8–10.8)

## 2024-03-20 LAB — BASIC METABOLIC PANEL WITH GFR
BUN: 10 mg/dL (ref 7–25)
CO2: 23 mmol/L (ref 20–32)
Calcium: 8.9 mg/dL (ref 8.6–10.2)
Chloride: 103 mmol/L (ref 98–110)
Creat: 0.81 mg/dL (ref 0.50–0.99)
Glucose, Bld: 91 mg/dL (ref 65–99)
Potassium: 4 mmol/L (ref 3.5–5.3)
Sodium: 134 mmol/L — ABNORMAL LOW (ref 135–146)
eGFR: 92 mL/min/1.73m2 (ref >=60)

## 2024-03-20 LAB — IRON,TIBC AND FERRITIN PANEL
%SAT: 18 % (ref 16–45)
Ferritin: 10 ng/mL — ABNORMAL LOW (ref 16–232)
Iron: 73 ug/dL (ref 40–190)
TIBC: 414 ug/dL (ref 250–450)

## 2024-03-20 LAB — BRAIN NATRIURETIC PEPTIDE: Brain Natriuretic Peptide: 4 pg/mL (ref <100)

## 2024-03-20 LAB — SEDIMENTATION RATE: Sed Rate: 38 mm/h — ABNORMAL HIGH (ref 0–20)

## 2024-03-20 NOTE — Assessment & Plan Note (Addendum)
 Onset ? As child bad hay fever  - Allergy screen 03/19/2024 >  Eos 0.2   - trial of  1st gen H1 blockers per guidelines   at HS first if tolerates plus empirical GERD rx   Upper airway cough syndrome (previously labeled PNDS),  is so named because it's frequently impossible to sort out how much is  CR/sinusitis with freq throat clearing (which can be related to primary GERD)   vs  causing  secondary ( extra esophageal)  GERD from wide swings in gastric pressure that occur with throat clearing, often  promoting self use of mint and menthol  lozenges that reduce the lower esophageal sphincter tone and exacerbate the problem further in a cyclical fashion.   These are the same pts (now being labeled as having irritable larynx syndrome by some cough centers) who not infrequently have a history of having failed to tolerate ace inhibitors,  dry powder inhalers or biphosphonates or report having atypical/extraesophageal reflux symptoms from LPR (globus, throat clearing)  that don't respond to standard doses of PPI  and are easily confused as having aecopd or asthma flares by even experienced allergists/ pulmonologists (myself included).   Rx as above, f/u in 3 m, sooner if needed

## 2024-03-20 NOTE — Assessment & Plan Note (Addendum)
 Chronic heavy menses due to uterine fibroids per pt report 03/19/2024  - Ferritin 03/19/2024   =  10      Lab Results  Component Value Date   HGB 10.6 (L) 03/19/2024   HGB 11.4 02/19/2024   HGB 13.3 12/20/2022   HGB 10.6 (L) 08/19/2021   HGB 9.7 (L) 06/30/2021   HGB 7.6 (L) 06/21/2021   HGB 11.6 (L) 07/15/2019     Defer rx to OBGYN or hematology if needed   Each maintenance medication was reviewed in detail including emphasizing most importantly the difference between maintenance and prns and under what circumstances the prns are to be triggered using an action plan format where appropriate.  Total time for H and P, chart review, counseling,  directly observing portions of ambulatory 02 saturation study/ and generating customized AVS unique to this office visit / same day charting = 60 min nw pt eval for multiple  refractory respiratory  symptoms of uncertain etiology

## 2024-03-20 NOTE — Telephone Encounter (Signed)
 Request pharmacy records on rx for macrodantin  - pt says once but not clear date related to abnormal  CT chest

## 2024-03-20 NOTE — Assessment & Plan Note (Addendum)
 Onset fall 2025 ? P rx with macrodantin ?  - CTa  02/20/24 neg PE,  c/w GG changes (will request pharmacy records on timeing of macrodantin rx vs CT)  - 03/19/2024   Walked on RA  x  3  lap(s) =  approx 750  ft  @ mod to fast  pace, stopped due to end of study  with lowest 02 sats 92% though  did dip from 100 to 87 (likely artifact) transiently  - ESR 03/19/2024     = 38  - DOE labs 03/19/2024 >> c/w mild fe def anemia. (See sep a/p)   The GG changes are mild as is the elevation of ESR but if she took macrodantin before the CT then that is worrisome for macrodantin lung toxicity which can take many forms both acute and chronic.   Advised: >>>  List as allegic to macodantin/ check pharmacy records  >>> Serial sats at peak exercise and consider HRCT next if trending down vs empiric short course steroids but hold rx for now  >>> check bnp w/a as mild chronic Fe def can cause high output chf and cause the GG changes on CT as well.

## 2024-03-23 ENCOUNTER — Other Ambulatory Visit: Payer: Self-pay | Admitting: Family Medicine

## 2024-03-23 DIAGNOSIS — B009 Herpesviral infection, unspecified: Secondary | ICD-10-CM

## 2024-03-24 LAB — MYOMARKER 3 PLUS PROFILE (RDL)

## 2024-03-24 LAB — HEMOGLOBIN A1C
Est. average glucose Bld gHb Est-mCnc: 103 mg/dL
Hgb A1c MFr Bld: 5.2 % (ref 4.8–5.6)

## 2024-03-24 LAB — ANA: Anti Nuclear Antibody (ANA): NEGATIVE

## 2024-03-24 NOTE — Progress Notes (Signed)
 [name], it was good to run into you at [employer] yesterday.  The additional Myo marker test did come back negative which is also reassuring.  And your A1c looks great!  No sign of diabetes.

## 2024-03-24 NOTE — Telephone Encounter (Signed)
 Called and spoke with the pharmacist at [facility] Pt had macrobid Dec 2024 and then again 02/11/24

## 2024-03-25 NOTE — Telephone Encounter (Signed)
 I called the pt and there was no answer- LMTCB. ?

## 2024-03-28 NOTE — Progress Notes (Signed)
 ATCP x1 left vm to call back.

## 2024-04-01 NOTE — Progress Notes (Signed)
 ATCP X1 left vm to give call back.

## 2024-04-03 DIAGNOSIS — Z713 Dietary counseling and surveillance: Secondary | ICD-10-CM | POA: Diagnosis not present

## 2024-04-04 ENCOUNTER — Telehealth: Payer: Self-pay

## 2024-04-04 NOTE — Telephone Encounter (Signed)
 Copied from CRM #[id number]. Topic: General - Other >> Apr 02, 2024  8:52 AM Russell PARAS wrote: Reason for CRM:   Pt is returning call from [place], appears to be about CT results. Contacted [facility], and spoke with [place] who sent message to triage. No response and advised to send message  Requested call back   CB#  [phone]

## 2024-04-04 NOTE — Telephone Encounter (Signed)
 Per Dr. Chari note from 03/25/2023: Darlean Ozell NOVAK, MD to [name], CMA     03/24/24  4:44 PM Thx- the CT was done 9 days later and is c/w acute macrodantin lung so I listed it as an allergy  - call pt and let her know I think that's wat is was and should never take it again  Called patient. Gave information regarding the iron  deficiency from 03/20/2024 and also on the acute macrodantin lung per Dr. Darlean.  Patient verbalized understanding and has a 3 month follow up.

## 2024-04-11 NOTE — Progress Notes (Signed)
 Sending MyChart message per protocol, as pt has been unable to be reached. NFN

## 2024-04-19 ENCOUNTER — Telehealth: Admitting: Nurse Practitioner

## 2024-04-19 DIAGNOSIS — N390 Urinary tract infection, site not specified: Secondary | ICD-10-CM | POA: Diagnosis not present

## 2024-04-19 MED ORDER — SULFAMETHOXAZOLE-TRIMETHOPRIM 800-160 MG PO TABS: 1.0000 | ORAL_TABLET | Freq: Two times a day (BID) | ORAL | 0 refills | Status: AC

## 2024-04-19 NOTE — Progress Notes (Signed)

## 2024-04-21 ENCOUNTER — Other Ambulatory Visit: Payer: Self-pay | Admitting: Hematology & Oncology

## 2024-04-21 NOTE — Telephone Encounter (Signed)
 Called pt and there was no answer and no VM option  Closing per protocol  Pt has ov pending March 2026

## 2024-04-28 ENCOUNTER — Encounter: Payer: Self-pay | Admitting: Obstetrics and Gynecology

## 2024-05-27 ENCOUNTER — Other Ambulatory Visit: Payer: Self-pay | Admitting: Family Medicine

## 2024-07-01 ENCOUNTER — Ambulatory Visit: Admitting: Internal Medicine

## 2024-08-19 ENCOUNTER — Ambulatory Visit: Admitting: Family Medicine
